# Patient Record
Sex: Female | Born: 1955 | Race: White | Hispanic: No | Marital: Married | Smoking: Former smoker
Health system: Southern US, Community
[De-identification: ages and names within clinical notes are randomized; demographics above are authoritative.]

## PROBLEM LIST (undated history)

## (undated) DIAGNOSIS — G8929 Other chronic pain: Secondary | ICD-10-CM

## (undated) DIAGNOSIS — M545 Low back pain, unspecified: Secondary | ICD-10-CM

## (undated) DIAGNOSIS — K760 Fatty (change of) liver, not elsewhere classified: Secondary | ICD-10-CM

## (undated) DIAGNOSIS — R112 Nausea with vomiting, unspecified: Secondary | ICD-10-CM

## (undated) DIAGNOSIS — R200 Anesthesia of skin: Secondary | ICD-10-CM

## (undated) DIAGNOSIS — F419 Anxiety disorder, unspecified: Secondary | ICD-10-CM

## (undated) DIAGNOSIS — I890 Lymphedema, not elsewhere classified: Secondary | ICD-10-CM

## (undated) DIAGNOSIS — I2699 Other pulmonary embolism without acute cor pulmonale: Secondary | ICD-10-CM

## (undated) DIAGNOSIS — S21009A Unspecified open wound of unspecified breast, initial encounter: Secondary | ICD-10-CM

## (undated) DIAGNOSIS — Z9889 Other specified postprocedural states: Secondary | ICD-10-CM

## (undated) DIAGNOSIS — IMO0002 Reserved for concepts with insufficient information to code with codable children: Secondary | ICD-10-CM

## (undated) DIAGNOSIS — Z9221 Personal history of antineoplastic chemotherapy: Secondary | ICD-10-CM

## (undated) DIAGNOSIS — Z923 Personal history of irradiation: Secondary | ICD-10-CM

## (undated) DIAGNOSIS — T7840XA Allergy, unspecified, initial encounter: Secondary | ICD-10-CM

## (undated) DIAGNOSIS — K219 Gastro-esophageal reflux disease without esophagitis: Secondary | ICD-10-CM

## (undated) DIAGNOSIS — J189 Pneumonia, unspecified organism: Secondary | ICD-10-CM

## (undated) DIAGNOSIS — G43909 Migraine, unspecified, not intractable, without status migrainosus: Secondary | ICD-10-CM

## (undated) DIAGNOSIS — G709 Myoneural disorder, unspecified: Secondary | ICD-10-CM

## (undated) DIAGNOSIS — R0602 Shortness of breath: Secondary | ICD-10-CM

## (undated) DIAGNOSIS — C50919 Malignant neoplasm of unspecified site of unspecified female breast: Secondary | ICD-10-CM

## (undated) DIAGNOSIS — L309 Dermatitis, unspecified: Secondary | ICD-10-CM

## (undated) HISTORY — DX: Fatty (change of) liver, not elsewhere classified: K76.0

## (undated) HISTORY — DX: Allergy, unspecified, initial encounter: T78.40XA

## (undated) HISTORY — PX: PORTACATH PLACEMENT: SHX2246

## (undated) HISTORY — DX: Reserved for concepts with insufficient information to code with codable children: IMO0002

## (undated) HISTORY — PX: BREAST SURGERY: SHX581

## (undated) HISTORY — DX: Lymphedema, not elsewhere classified: I89.0

## (undated) HISTORY — DX: Personal history of irradiation: Z92.3

## (undated) HISTORY — DX: Anesthesia of skin: R20.0

## (undated) HISTORY — DX: Gastro-esophageal reflux disease without esophagitis: K21.9

## (undated) HISTORY — DX: Other pulmonary embolism without acute cor pulmonale: I26.99

## (undated) HISTORY — DX: Unspecified open wound of unspecified breast, initial encounter: S21.009A

## (undated) HISTORY — PX: DIAGNOSTIC LAPAROSCOPY: SUR761

## (undated) HISTORY — DX: Dermatitis, unspecified: L30.9

## (undated) HISTORY — DX: Low back pain: M54.5

## (undated) HISTORY — DX: Low back pain, unspecified: M54.50

## (undated) HISTORY — PX: ABDOMINAL HYSTERECTOMY: SHX81

## (undated) HISTORY — DX: Other chronic pain: G89.29

## (undated) HISTORY — DX: Migraine, unspecified, not intractable, without status migrainosus: G43.909

## (undated) SURGERY — BRONCHOSCOPY, FLEXIBLE
Anesthesia: General | Laterality: Bilateral

---

## 1977-07-27 HISTORY — PX: TUBAL LIGATION: SHX77

## 1997-07-27 HISTORY — PX: CHOLECYSTECTOMY: SHX55

## 1997-12-07 ENCOUNTER — Ambulatory Visit (HOSPITAL_COMMUNITY): Admission: RE | Admit: 1997-12-07 | Discharge: 1997-12-07 | Payer: Self-pay | Admitting: General Surgery

## 1997-12-13 ENCOUNTER — Ambulatory Visit (HOSPITAL_COMMUNITY): Admission: RE | Admit: 1997-12-13 | Discharge: 1997-12-13 | Payer: Self-pay | Admitting: General Surgery

## 1998-06-14 ENCOUNTER — Ambulatory Visit (HOSPITAL_COMMUNITY): Admission: RE | Admit: 1998-06-14 | Discharge: 1998-06-14 | Payer: Self-pay | Admitting: General Surgery

## 1998-06-14 ENCOUNTER — Encounter: Payer: Self-pay | Admitting: General Surgery

## 1998-12-31 ENCOUNTER — Encounter: Payer: Self-pay | Admitting: General Surgery

## 1998-12-31 ENCOUNTER — Ambulatory Visit (HOSPITAL_COMMUNITY): Admission: RE | Admit: 1998-12-31 | Discharge: 1998-12-31 | Payer: Self-pay | Admitting: General Surgery

## 2000-01-02 ENCOUNTER — Encounter: Payer: Self-pay | Admitting: General Surgery

## 2000-01-02 ENCOUNTER — Encounter: Admission: RE | Admit: 2000-01-02 | Discharge: 2000-01-02 | Payer: Self-pay | Admitting: General Surgery

## 2001-08-27 ENCOUNTER — Emergency Department (HOSPITAL_COMMUNITY): Admission: EM | Admit: 2001-08-27 | Discharge: 2001-08-27 | Payer: Self-pay | Admitting: Emergency Medicine

## 2001-08-27 ENCOUNTER — Encounter: Payer: Self-pay | Admitting: Emergency Medicine

## 2002-06-07 ENCOUNTER — Other Ambulatory Visit: Admission: RE | Admit: 2002-06-07 | Discharge: 2002-06-07 | Payer: Self-pay | Admitting: Radiology

## 2003-04-21 ENCOUNTER — Observation Stay (HOSPITAL_COMMUNITY): Admission: EM | Admit: 2003-04-21 | Discharge: 2003-04-22 | Payer: Self-pay | Admitting: Emergency Medicine

## 2003-04-21 ENCOUNTER — Encounter: Payer: Self-pay | Admitting: Emergency Medicine

## 2003-06-14 ENCOUNTER — Encounter: Admission: RE | Admit: 2003-06-14 | Discharge: 2003-06-14 | Payer: Self-pay | Admitting: Internal Medicine

## 2003-06-25 ENCOUNTER — Encounter: Admission: RE | Admit: 2003-06-25 | Discharge: 2003-06-25 | Payer: Self-pay | Admitting: Internal Medicine

## 2010-09-22 ENCOUNTER — Other Ambulatory Visit: Payer: Self-pay | Admitting: Radiology

## 2010-09-23 ENCOUNTER — Other Ambulatory Visit: Payer: Self-pay | Admitting: Radiology

## 2010-09-23 DIAGNOSIS — C50912 Malignant neoplasm of unspecified site of left female breast: Secondary | ICD-10-CM

## 2010-09-28 ENCOUNTER — Ambulatory Visit
Admission: RE | Admit: 2010-09-28 | Discharge: 2010-09-28 | Disposition: A | Discharge status: Home / Self Care | Payer: BC Managed Care – PPO | Source: Ambulatory Visit | Attending: Radiology | Admitting: Radiology

## 2010-09-28 DIAGNOSIS — C50912 Malignant neoplasm of unspecified site of left female breast: Secondary | ICD-10-CM

## 2010-09-28 MED ORDER — GADOBENATE DIMEGLUMINE 529 MG/ML IV SOLN
15.0000 mL | Freq: Once | INTRAVENOUS | Status: AC | PRN
  Administered 2010-09-28: 15 mL via INTRAVENOUS

## 2010-10-01 ENCOUNTER — Other Ambulatory Visit: Payer: Self-pay | Admitting: Oncology

## 2010-10-01 ENCOUNTER — Encounter (HOSPITAL_BASED_OUTPATIENT_CLINIC_OR_DEPARTMENT_OTHER): Payer: BC Managed Care – PPO | Admitting: Oncology

## 2010-10-01 DIAGNOSIS — C50919 Malignant neoplasm of unspecified site of unspecified female breast: Secondary | ICD-10-CM

## 2010-10-01 DIAGNOSIS — K769 Liver disease, unspecified: Secondary | ICD-10-CM

## 2010-10-01 LAB — CBC WITH DIFFERENTIAL/PLATELET
BASO%: 0.3 % (ref 0.0–2.0)
EOS%: 1.3 % (ref 0.0–7.0)
LYMPH%: 27.5 % (ref 14.0–49.7)
MCHC: 34.1 g/dL (ref 31.5–36.0)
MCV: 86.8 fL (ref 79.5–101.0)
MONO%: 5.3 % (ref 0.0–14.0)
Platelets: 236 10*3/uL (ref 145–400)
RBC: 4.51 10*6/uL (ref 3.70–5.45)

## 2010-10-01 LAB — COMPREHENSIVE METABOLIC PANEL
ALT: 32 U/L (ref 0–35)
AST: 25 U/L (ref 0–37)
Alkaline Phosphatase: 65 U/L (ref 39–117)
Sodium: 141 mEq/L (ref 135–145)
Total Bilirubin: 0.6 mg/dL (ref 0.3–1.2)
Total Protein: 7.5 g/dL (ref 6.0–8.3)

## 2010-10-03 ENCOUNTER — Encounter: Payer: BC Managed Care – PPO | Admitting: Genetic Counselor

## 2010-10-06 ENCOUNTER — Ambulatory Visit
Admission: RE | Admit: 2010-10-06 | Discharge: 2010-10-06 | Disposition: A | Discharge status: Home / Self Care | Payer: BC Managed Care – PPO | Source: Ambulatory Visit | Attending: Oncology | Admitting: Oncology

## 2010-10-06 DIAGNOSIS — K769 Liver disease, unspecified: Secondary | ICD-10-CM

## 2010-10-06 MED ORDER — GADOBENATE DIMEGLUMINE 529 MG/ML IV SOLN
15.0000 mL | Freq: Once | INTRAVENOUS | Status: AC | PRN
  Administered 2010-10-06: 15 mL via INTRAVENOUS

## 2010-10-09 ENCOUNTER — Other Ambulatory Visit: Payer: Self-pay | Admitting: Oncology

## 2010-10-09 ENCOUNTER — Ambulatory Visit (HOSPITAL_COMMUNITY)
Admission: RE | Admit: 2010-10-09 | Discharge: 2010-10-09 | Disposition: A | Discharge status: Home / Self Care | Payer: BC Managed Care – PPO | Source: Ambulatory Visit | Attending: Oncology | Admitting: Oncology

## 2010-10-09 DIAGNOSIS — C119 Malignant neoplasm of nasopharynx, unspecified: Secondary | ICD-10-CM | POA: Insufficient documentation

## 2010-10-09 DIAGNOSIS — Z09 Encounter for follow-up examination after completed treatment for conditions other than malignant neoplasm: Secondary | ICD-10-CM | POA: Insufficient documentation

## 2010-10-10 ENCOUNTER — Ambulatory Visit (HOSPITAL_COMMUNITY)
Admission: RE | Admit: 2010-10-10 | Discharge: 2010-10-10 | Disposition: A | Discharge status: Home / Self Care | Payer: BC Managed Care – PPO | Source: Ambulatory Visit | Attending: Oncology | Admitting: Oncology

## 2010-10-10 ENCOUNTER — Encounter (HOSPITAL_COMMUNITY)
Admission: RE | Admit: 2010-10-10 | Discharge: 2010-10-10 | Disposition: A | Discharge status: Home / Self Care | Payer: BC Managed Care – PPO | Source: Ambulatory Visit | Attending: Oncology | Admitting: Oncology

## 2010-10-10 ENCOUNTER — Encounter (HOSPITAL_COMMUNITY): Payer: Self-pay

## 2010-10-10 DIAGNOSIS — R599 Enlarged lymph nodes, unspecified: Secondary | ICD-10-CM | POA: Insufficient documentation

## 2010-10-10 DIAGNOSIS — C50919 Malignant neoplasm of unspecified site of unspecified female breast: Secondary | ICD-10-CM | POA: Insufficient documentation

## 2010-10-10 DIAGNOSIS — C778 Secondary and unspecified malignant neoplasm of lymph nodes of multiple regions: Secondary | ICD-10-CM | POA: Insufficient documentation

## 2010-10-10 DIAGNOSIS — K7689 Other specified diseases of liver: Secondary | ICD-10-CM | POA: Insufficient documentation

## 2010-10-10 LAB — GLUCOSE, CAPILLARY: Glucose-Capillary: 101 mg/dL — ABNORMAL HIGH (ref 70–99)

## 2010-10-10 MED ORDER — FLUDEOXYGLUCOSE F - 18 (FDG) INJECTION
14.4000 | Freq: Once | INTRAVENOUS | Status: AC | PRN
  Administered 2010-10-10: 14.4 via INTRAVENOUS

## 2010-10-10 MED ORDER — IOHEXOL 300 MG/ML  SOLN
80.0000 mL | Freq: Once | INTRAMUSCULAR | Status: AC | PRN
  Administered 2010-10-10: 80 mL via INTRAVENOUS

## 2010-10-20 ENCOUNTER — Encounter (HOSPITAL_COMMUNITY): Payer: BC Managed Care – PPO

## 2010-10-20 ENCOUNTER — Other Ambulatory Visit: Payer: Self-pay | Admitting: Surgery

## 2010-10-20 LAB — CBC
HCT: 41.4 % (ref 36.0–46.0)
Hemoglobin: 13.3 g/dL (ref 12.0–15.0)
MCH: 28.7 pg (ref 26.0–34.0)
MCHC: 32.1 g/dL (ref 30.0–36.0)

## 2010-10-20 LAB — SURGICAL PCR SCREEN: MRSA, PCR: NEGATIVE

## 2010-10-23 ENCOUNTER — Observation Stay (HOSPITAL_COMMUNITY)
Admission: RE | Admit: 2010-10-23 | Discharge: 2010-10-24 | Disposition: A | Discharge status: Home / Self Care | Payer: BC Managed Care – PPO | Source: Ambulatory Visit | Attending: Surgery | Admitting: Surgery

## 2010-10-23 ENCOUNTER — Other Ambulatory Visit: Payer: Self-pay | Admitting: Surgery

## 2010-10-23 ENCOUNTER — Ambulatory Visit (HOSPITAL_COMMUNITY): Payer: BC Managed Care – PPO

## 2010-10-23 DIAGNOSIS — R110 Nausea: Secondary | ICD-10-CM | POA: Insufficient documentation

## 2010-10-23 DIAGNOSIS — C50919 Malignant neoplasm of unspecified site of unspecified female breast: Principal | ICD-10-CM | POA: Insufficient documentation

## 2010-10-23 DIAGNOSIS — Z01812 Encounter for preprocedural laboratory examination: Secondary | ICD-10-CM | POA: Insufficient documentation

## 2010-10-23 DIAGNOSIS — C773 Secondary and unspecified malignant neoplasm of axilla and upper limb lymph nodes: Secondary | ICD-10-CM | POA: Insufficient documentation

## 2010-10-23 DIAGNOSIS — Z01811 Encounter for preprocedural respiratory examination: Secondary | ICD-10-CM | POA: Insufficient documentation

## 2010-10-23 DIAGNOSIS — Z452 Encounter for adjustment and management of vascular access device: Secondary | ICD-10-CM

## 2010-10-23 HISTORY — DX: Malignant neoplasm of unspecified site of unspecified female breast: C50.919

## 2010-10-28 NOTE — Op Note (Signed)
Brandi Bates, Brandi Bates                  ACCOUNT NO.:  [id number]  MEDICAL RECORD NO.:  [id number]           PATIENT TYPE:  I  LOCATION:  1538                         FACILITY:  [facility]  PHYSICIAN:  Abigail Miyamoto, M.D. DATE OF BIRTH:  1955-08-12  DATE OF PROCEDURE:  10/23/2010 DATE OF DISCHARGE:                              OPERATIVE REPORT   PREOPERATIVE DIAGNOSIS:  Left breast cancer.  POSTOPERATIVE DIAGNOSIS:  Left breast cancer.  PROCEDURE: 1. Left modified radical mastectomy. 2. Right subclavian Port-A-Cath insertion (8-French PowerPort).  SURGEON:  Abigail Miyamoto, M.D.  ANESTHESIA:  General.  ESTIMATED BLOOD LOSS:  Minimal.  INDICATION:  Brandi Bates is a 55 year old female with a large mass in her left breast.  This was then proven by a biopsy to be consistent with invasive breast cancer.  She also has large hard lymph nodes in her axilla, which had been biopsied and proven to be consistent with metastatic lymph nodes as well.  After a long discussion with the patient, she wished to proceed with a mastectomy as well as a Port-A- Cath insertion.  She has already seen both Radiation and Medical Oncology, and will be starting chemotherapy.  PROCEDURE IN DETAIL:  The patient was brought to the operating room, identified as Brandi Bates.  She was placed supine on the operating table and general anesthesia was induced.  Her left chest and axilla were then prepped and draped in usual sterile fashion.  I created an elliptical incision on the left chest incorporating the areola and taken this up into the axilla moving medial to lateral.  I first created the superior skin flap going down the breast tissue and then dissecting the flap with the electrocautery up to the level of the clavicle and the chest wall musculature.  I then created the inferior flap going down past the inframammary ridge.  Thin flaps appeared to be created.  I took this all the way to the axilla with the  cautery.  I then excised the breast from the chest wall with the medial to lateral removing the breast from the pectoralis fascia.  The tumor itself did not appear to extend into the pectoralis muscle or fascia.  I then moved toward the axilla.  The patient had very large hard lymph nodes in the axilla.  I took the breast specimen off the pectoralis major muscle and then the minor muscle leaving toward the axilla.  Several bridging veins had to be clip with surgical clips.  The axillary vein was then identified.  Again, the patient had large firm adenopathy superficial to this, which I excised. The long thoracic and thoracodorsal nerves were identified.  The patient had a very large almost 4-5 cm lymph node that was stretching out the thoracodorsal nerve and artery complex.  I was able to separate this lymph node from the structures without injuring the structures.  The complete axillary package was then completely excised with the use of surgical clips and Metzenbaum scissors.  This was left intact with the breast specimen.  Once all the lymph nodes were completely excised, I examined the axilla and further  found once higher axillary lymph node, which I excised as well.  The specimen was labeled medially with a silk suture.  I then sent the entire contents to Pathology for evaluation.  I then irrigated the axilla and chest wall and flaps with saline. Hemostasis appeared to be achieved with cautery.  I then made 2 separate skin incisions and placed a 19-French Blake drain on the chest wall and another in the left axilla.  These were then sewn in place with nylon sutures.  I then closed the long incision with interrupted 2-0 Vicryl sutures and running 4-0 Monocryl.  Steri-Strips, gauze, and tape were then applied.  The drains were then shortened and placed to bulb suction.  The patient tolerated this portion of procedure well.  All counts were correct at the end of the procedure.  At this  point, the patient's right arm and left arm were tucked.  A whole new set was brought onto the field.  I then prepped her right chest and neck in the usual sterile fashion.  With the patient in Trendelenburg position, I used introducer needle to cannulate the right subclavian vein easily.  A guidewire was then passed through the needle into central venous system under direct fluoroscopy.  I made a separate skin incision at the level of the introducer site with a scalpel.  An 8-French PowerPort was brought to the field and attached and flushed.  I then created a pocket for the port.  Once this was performed, I then placed the introducer sheath and dilator up the wire and I was able to remove the wire and dilator from the subclavian vein leaving the introducer sheath in place. The port and catheter were then cut appropriately at length.  I then placed the port into the pocket and then fed the catheter down the introducer sheath.  The sheath was then peeled away leaving the catheter and central venous system.  Fluoroscopy confirmed placement in the superior vena cava.  I then again accessed the port and good flush and return were demonstrated.  The port was then sewn to the chest wall with 2 separate 2-0 Prolene sutures.  I then closed subcutaneous tissue with interrupted 3-0 sutures and closed the skin with running 4-0 Monocryl. Steri-Strips, gauze, and Tegaderm were then applied.  I did accessed the port through the skin and again good flush and flow were demonstrated and I instilled it with 5 cc of concentrated heparin solution.  The patient tolerated all the procedures well.  All sponge, needle, and instrument counts were correct at the end of the procedure.  The patient was then extubated in the operating room and taken in stable condition to recovery room.     Abigail Miyamoto, M.D.     DB/MEDQ  D:  10/23/2010  T:  10/24/2010  Job:  [id number]  Electronically Signed by Abigail Miyamoto M.D. on 10/28/2010 10:44:16 AM

## 2010-11-14 ENCOUNTER — Encounter (HOSPITAL_BASED_OUTPATIENT_CLINIC_OR_DEPARTMENT_OTHER): Payer: BC Managed Care – PPO | Admitting: Oncology

## 2010-11-14 DIAGNOSIS — C50919 Malignant neoplasm of unspecified site of unspecified female breast: Secondary | ICD-10-CM

## 2010-11-20 ENCOUNTER — Encounter (HOSPITAL_BASED_OUTPATIENT_CLINIC_OR_DEPARTMENT_OTHER): Payer: BC Managed Care – PPO | Admitting: Oncology

## 2010-11-20 DIAGNOSIS — Z5111 Encounter for antineoplastic chemotherapy: Secondary | ICD-10-CM

## 2010-11-20 DIAGNOSIS — Z452 Encounter for adjustment and management of vascular access device: Secondary | ICD-10-CM

## 2010-11-20 DIAGNOSIS — C50919 Malignant neoplasm of unspecified site of unspecified female breast: Secondary | ICD-10-CM

## 2010-11-21 ENCOUNTER — Encounter (HOSPITAL_BASED_OUTPATIENT_CLINIC_OR_DEPARTMENT_OTHER): Payer: BC Managed Care – PPO | Admitting: Oncology

## 2010-11-21 DIAGNOSIS — C50919 Malignant neoplasm of unspecified site of unspecified female breast: Secondary | ICD-10-CM

## 2010-11-21 DIAGNOSIS — Z5189 Encounter for other specified aftercare: Secondary | ICD-10-CM

## 2010-11-24 ENCOUNTER — Ambulatory Visit: Payer: BC Managed Care – PPO | Attending: Oncology | Admitting: Physical Therapy

## 2010-11-24 DIAGNOSIS — IMO0001 Reserved for inherently not codable concepts without codable children: Secondary | ICD-10-CM | POA: Insufficient documentation

## 2010-11-24 DIAGNOSIS — M24519 Contracture, unspecified shoulder: Secondary | ICD-10-CM | POA: Insufficient documentation

## 2010-11-24 DIAGNOSIS — Z853 Personal history of malignant neoplasm of breast: Secondary | ICD-10-CM | POA: Insufficient documentation

## 2010-11-24 DIAGNOSIS — I890 Lymphedema, not elsewhere classified: Secondary | ICD-10-CM | POA: Insufficient documentation

## 2010-11-26 ENCOUNTER — Other Ambulatory Visit: Payer: Self-pay | Admitting: Oncology

## 2010-11-26 ENCOUNTER — Ambulatory Visit: Payer: BC Managed Care – PPO | Attending: Oncology | Admitting: Physical Therapy

## 2010-11-26 ENCOUNTER — Encounter (HOSPITAL_BASED_OUTPATIENT_CLINIC_OR_DEPARTMENT_OTHER): Payer: BC Managed Care – PPO | Admitting: Oncology

## 2010-11-26 DIAGNOSIS — I890 Lymphedema, not elsewhere classified: Secondary | ICD-10-CM | POA: Insufficient documentation

## 2010-11-26 DIAGNOSIS — IMO0001 Reserved for inherently not codable concepts without codable children: Secondary | ICD-10-CM | POA: Insufficient documentation

## 2010-11-26 DIAGNOSIS — Z853 Personal history of malignant neoplasm of breast: Secondary | ICD-10-CM | POA: Insufficient documentation

## 2010-11-26 DIAGNOSIS — M24519 Contracture, unspecified shoulder: Secondary | ICD-10-CM | POA: Insufficient documentation

## 2010-11-26 DIAGNOSIS — C50919 Malignant neoplasm of unspecified site of unspecified female breast: Secondary | ICD-10-CM

## 2010-11-26 LAB — CBC WITH DIFFERENTIAL/PLATELET
Basophils Absolute: 0 10*3/uL (ref 0.0–0.1)
EOS%: 7.6 % — ABNORMAL HIGH (ref 0.0–7.0)
HCT: 37 % (ref 34.8–46.6)
HGB: 12.4 g/dL (ref 11.6–15.9)
MCH: 28.6 pg (ref 25.1–34.0)
MONO#: 0 10*3/uL — ABNORMAL LOW (ref 0.1–0.9)
NEUT%: 37.1 % — ABNORMAL LOW (ref 38.4–76.8)
lymph#: 0.5 10*3/uL — ABNORMAL LOW (ref 0.9–3.3)

## 2010-12-01 ENCOUNTER — Ambulatory Visit: Payer: BC Managed Care – PPO | Admitting: Physical Therapy

## 2010-12-03 ENCOUNTER — Ambulatory Visit: Payer: BC Managed Care – PPO | Admitting: Physical Therapy

## 2010-12-03 ENCOUNTER — Other Ambulatory Visit: Payer: Self-pay | Admitting: Oncology

## 2010-12-03 ENCOUNTER — Encounter (HOSPITAL_BASED_OUTPATIENT_CLINIC_OR_DEPARTMENT_OTHER): Payer: BC Managed Care – PPO | Admitting: Oncology

## 2010-12-03 DIAGNOSIS — C50919 Malignant neoplasm of unspecified site of unspecified female breast: Secondary | ICD-10-CM

## 2010-12-03 LAB — CBC WITH DIFFERENTIAL/PLATELET
Basophils Absolute: 0 10*3/uL (ref 0.0–0.1)
EOS%: 0.1 % (ref 0.0–7.0)
HCT: 37.3 % (ref 34.8–46.6)
HGB: 12.5 g/dL (ref 11.6–15.9)
LYMPH%: 15.4 % (ref 14.0–49.7)
MCH: 28.4 pg (ref 25.1–34.0)
MCV: 84.8 fL (ref 79.5–101.0)
MONO%: 4.2 % (ref 0.0–14.0)
NEUT%: 80 % — ABNORMAL HIGH (ref 38.4–76.8)
Platelets: 157 10*3/uL (ref 145–400)

## 2010-12-04 ENCOUNTER — Encounter (HOSPITAL_BASED_OUTPATIENT_CLINIC_OR_DEPARTMENT_OTHER): Payer: BC Managed Care – PPO | Admitting: Oncology

## 2010-12-04 DIAGNOSIS — Z5111 Encounter for antineoplastic chemotherapy: Secondary | ICD-10-CM

## 2010-12-04 DIAGNOSIS — C50919 Malignant neoplasm of unspecified site of unspecified female breast: Secondary | ICD-10-CM

## 2010-12-05 ENCOUNTER — Encounter (HOSPITAL_BASED_OUTPATIENT_CLINIC_OR_DEPARTMENT_OTHER): Payer: BC Managed Care – PPO | Admitting: Oncology

## 2010-12-05 DIAGNOSIS — Z5189 Encounter for other specified aftercare: Secondary | ICD-10-CM

## 2010-12-05 DIAGNOSIS — C50919 Malignant neoplasm of unspecified site of unspecified female breast: Secondary | ICD-10-CM

## 2010-12-08 ENCOUNTER — Ambulatory Visit: Payer: BC Managed Care – PPO | Admitting: Physical Therapy

## 2010-12-10 ENCOUNTER — Ambulatory Visit: Payer: BC Managed Care – PPO | Admitting: Physical Therapy

## 2010-12-11 ENCOUNTER — Encounter (HOSPITAL_BASED_OUTPATIENT_CLINIC_OR_DEPARTMENT_OTHER): Payer: BC Managed Care – PPO | Admitting: Oncology

## 2010-12-11 ENCOUNTER — Other Ambulatory Visit: Payer: Self-pay | Admitting: Oncology

## 2010-12-11 DIAGNOSIS — D702 Other drug-induced agranulocytosis: Secondary | ICD-10-CM

## 2010-12-11 DIAGNOSIS — Z170 Estrogen receptor positive status [ER+]: Secondary | ICD-10-CM

## 2010-12-11 DIAGNOSIS — B370 Candidal stomatitis: Secondary | ICD-10-CM

## 2010-12-11 DIAGNOSIS — C50919 Malignant neoplasm of unspecified site of unspecified female breast: Secondary | ICD-10-CM

## 2010-12-11 LAB — CBC WITH DIFFERENTIAL/PLATELET
BASO%: 0 % (ref 0.0–2.0)
LYMPH%: 70.2 % — ABNORMAL HIGH (ref 14.0–49.7)
MCHC: 33.4 g/dL (ref 31.5–36.0)
MCV: 84.7 fL (ref 79.5–101.0)
MONO%: 12.8 % (ref 0.0–14.0)
NEUT%: 14.9 % — ABNORMAL LOW (ref 38.4–76.8)
Platelets: 112 10*3/uL — ABNORMAL LOW (ref 145–400)
RBC: 4.13 10*6/uL (ref 3.70–5.45)
nRBC: 0 % (ref 0–0)

## 2010-12-15 ENCOUNTER — Ambulatory Visit: Payer: BC Managed Care – PPO | Admitting: Physical Therapy

## 2010-12-17 ENCOUNTER — Other Ambulatory Visit: Payer: Self-pay | Admitting: Oncology

## 2010-12-17 ENCOUNTER — Encounter (HOSPITAL_BASED_OUTPATIENT_CLINIC_OR_DEPARTMENT_OTHER): Payer: BC Managed Care – PPO | Admitting: Oncology

## 2010-12-17 ENCOUNTER — Encounter: Payer: BC Managed Care – PPO | Admitting: Physical Therapy

## 2010-12-17 DIAGNOSIS — B370 Candidal stomatitis: Secondary | ICD-10-CM

## 2010-12-17 DIAGNOSIS — Z170 Estrogen receptor positive status [ER+]: Secondary | ICD-10-CM

## 2010-12-17 DIAGNOSIS — C50919 Malignant neoplasm of unspecified site of unspecified female breast: Secondary | ICD-10-CM

## 2010-12-17 LAB — CBC WITH DIFFERENTIAL/PLATELET
BASO%: 0 % (ref 0.0–2.0)
EOS%: 0 % (ref 0.0–7.0)
HCT: 34.7 % — ABNORMAL LOW (ref 34.8–46.6)
MCH: 29.5 pg (ref 25.1–34.0)
MCHC: 34.4 g/dL (ref 31.5–36.0)
MCV: 86 fL (ref 79.5–101.0)
MONO%: 3.6 % (ref 0.0–14.0)
NEUT%: 86.9 % — ABNORMAL HIGH (ref 38.4–76.8)
RDW: 13.9 % (ref 11.2–14.5)
lymph#: 0.8 10*3/uL — ABNORMAL LOW (ref 0.9–3.3)

## 2010-12-18 ENCOUNTER — Encounter (HOSPITAL_BASED_OUTPATIENT_CLINIC_OR_DEPARTMENT_OTHER): Payer: BC Managed Care – PPO | Admitting: Oncology

## 2010-12-18 ENCOUNTER — Other Ambulatory Visit: Payer: Self-pay | Admitting: Oncology

## 2010-12-18 DIAGNOSIS — Z5111 Encounter for antineoplastic chemotherapy: Secondary | ICD-10-CM

## 2010-12-18 DIAGNOSIS — C50919 Malignant neoplasm of unspecified site of unspecified female breast: Secondary | ICD-10-CM

## 2010-12-18 LAB — COMPREHENSIVE METABOLIC PANEL
ALT: 33 U/L (ref 0–35)
AST: 16 U/L (ref 0–37)
Alkaline Phosphatase: 82 U/L (ref 39–117)
Calcium: 8.9 mg/dL (ref 8.4–10.5)
Chloride: 106 mEq/L (ref 96–112)
Creatinine, Ser: 0.57 mg/dL (ref 0.40–1.20)
Total Bilirubin: 0.2 mg/dL — ABNORMAL LOW (ref 0.3–1.2)

## 2010-12-19 ENCOUNTER — Encounter (HOSPITAL_BASED_OUTPATIENT_CLINIC_OR_DEPARTMENT_OTHER): Payer: BC Managed Care – PPO | Admitting: Oncology

## 2010-12-19 ENCOUNTER — Encounter (INDEPENDENT_AMBULATORY_CARE_PROVIDER_SITE_OTHER): Payer: Self-pay | Admitting: Surgery

## 2010-12-19 DIAGNOSIS — Z5189 Encounter for other specified aftercare: Secondary | ICD-10-CM

## 2010-12-19 DIAGNOSIS — C50919 Malignant neoplasm of unspecified site of unspecified female breast: Secondary | ICD-10-CM

## 2010-12-24 ENCOUNTER — Encounter: Payer: BC Managed Care – PPO | Admitting: Physical Therapy

## 2010-12-25 ENCOUNTER — Other Ambulatory Visit: Payer: Self-pay | Admitting: Oncology

## 2010-12-25 ENCOUNTER — Encounter (HOSPITAL_BASED_OUTPATIENT_CLINIC_OR_DEPARTMENT_OTHER): Payer: BC Managed Care – PPO | Admitting: Oncology

## 2010-12-25 DIAGNOSIS — C50919 Malignant neoplasm of unspecified site of unspecified female breast: Secondary | ICD-10-CM

## 2010-12-25 LAB — CBC WITH DIFFERENTIAL/PLATELET
BASO%: 0.8 % (ref 0.0–2.0)
Basophils Absolute: 0 10*3/uL (ref 0.0–0.1)
EOS%: 0.9 % (ref 0.0–7.0)
HCT: 31.9 % — ABNORMAL LOW (ref 34.8–46.6)
HGB: 11 g/dL — ABNORMAL LOW (ref 11.6–15.9)
LYMPH%: 37 % (ref 14.0–49.7)
MCH: 29.6 pg (ref 25.1–34.0)
MCHC: 34.6 g/dL (ref 31.5–36.0)
MCV: 85.6 fL (ref 79.5–101.0)
MONO%: 10.2 % (ref 0.0–14.0)
NEUT%: 51.1 % (ref 38.4–76.8)
Platelets: 81 10*3/uL — ABNORMAL LOW (ref 145–400)

## 2010-12-29 ENCOUNTER — Encounter: Payer: BC Managed Care – PPO | Admitting: Physical Therapy

## 2010-12-31 ENCOUNTER — Encounter: Payer: BC Managed Care – PPO | Admitting: Physical Therapy

## 2011-01-01 ENCOUNTER — Other Ambulatory Visit: Payer: Self-pay | Admitting: Oncology

## 2011-01-01 ENCOUNTER — Encounter (HOSPITAL_BASED_OUTPATIENT_CLINIC_OR_DEPARTMENT_OTHER): Payer: BC Managed Care – PPO | Admitting: Oncology

## 2011-01-01 DIAGNOSIS — C50919 Malignant neoplasm of unspecified site of unspecified female breast: Secondary | ICD-10-CM

## 2011-01-01 DIAGNOSIS — Z5111 Encounter for antineoplastic chemotherapy: Secondary | ICD-10-CM

## 2011-01-01 LAB — COMPREHENSIVE METABOLIC PANEL WITH GFR
ALT: 35 U/L (ref 0–35)
AST: 16 U/L (ref 0–37)
Albumin: 4.3 g/dL (ref 3.5–5.2)
Alkaline Phosphatase: 86 U/L (ref 39–117)
BUN: 15 mg/dL (ref 6–23)
CO2: 21 meq/L (ref 19–32)
Calcium: 9.3 mg/dL (ref 8.4–10.5)
Chloride: 104 meq/L (ref 96–112)
Creatinine, Ser: 0.7 mg/dL (ref 0.50–1.10)
Glucose, Bld: 128 mg/dL — ABNORMAL HIGH (ref 70–99)
Potassium: 3.7 meq/L (ref 3.5–5.3)
Sodium: 139 meq/L (ref 135–145)
Total Bilirubin: 0.3 mg/dL (ref 0.3–1.2)
Total Protein: 6.5 g/dL (ref 6.0–8.3)

## 2011-01-01 LAB — CBC WITH DIFFERENTIAL/PLATELET
BASO%: 0.3 % (ref 0.0–2.0)
Basophils Absolute: 0 10*3/uL (ref 0.0–0.1)
EOS%: 0 % (ref 0.0–7.0)
MCH: 27.8 pg (ref 25.1–34.0)
MCHC: 32.8 g/dL (ref 31.5–36.0)
MCV: 85 fL (ref 79.5–101.0)
MONO%: 5 % (ref 0.0–14.0)
RDW: 15.3 % — ABNORMAL HIGH (ref 11.2–14.5)
lymph#: 0.7 10*3/uL — ABNORMAL LOW (ref 0.9–3.3)

## 2011-01-01 LAB — CANCER ANTIGEN 27.29: CA 27.29: 45 U/mL — ABNORMAL HIGH (ref 0–39)

## 2011-01-02 ENCOUNTER — Encounter (HOSPITAL_BASED_OUTPATIENT_CLINIC_OR_DEPARTMENT_OTHER): Payer: BC Managed Care – PPO | Admitting: Oncology

## 2011-01-02 DIAGNOSIS — C50919 Malignant neoplasm of unspecified site of unspecified female breast: Secondary | ICD-10-CM

## 2011-01-02 DIAGNOSIS — Z5189 Encounter for other specified aftercare: Secondary | ICD-10-CM

## 2011-01-05 ENCOUNTER — Other Ambulatory Visit: Payer: Self-pay | Admitting: Oncology

## 2011-01-05 ENCOUNTER — Encounter (HOSPITAL_BASED_OUTPATIENT_CLINIC_OR_DEPARTMENT_OTHER): Payer: BC Managed Care – PPO | Admitting: Oncology

## 2011-01-05 DIAGNOSIS — Z5111 Encounter for antineoplastic chemotherapy: Secondary | ICD-10-CM

## 2011-01-05 DIAGNOSIS — C50919 Malignant neoplasm of unspecified site of unspecified female breast: Secondary | ICD-10-CM

## 2011-01-05 DIAGNOSIS — B370 Candidal stomatitis: Secondary | ICD-10-CM

## 2011-01-05 DIAGNOSIS — Z170 Estrogen receptor positive status [ER+]: Secondary | ICD-10-CM

## 2011-01-05 LAB — COMPREHENSIVE METABOLIC PANEL
ALT: 25 U/L (ref 0–35)
AST: 13 U/L (ref 0–37)
Albumin: 3.7 g/dL (ref 3.5–5.2)
CO2: 28 mEq/L (ref 19–32)
Calcium: 9.3 mg/dL (ref 8.4–10.5)
Chloride: 99 mEq/L (ref 96–112)
Creatinine, Ser: 0.49 mg/dL — ABNORMAL LOW (ref 0.50–1.10)
Potassium: 4 mEq/L (ref 3.5–5.3)

## 2011-01-05 LAB — CBC WITH DIFFERENTIAL/PLATELET
BASO%: 0 % (ref 0.0–2.0)
Eosinophils Absolute: 0 10*3/uL (ref 0.0–0.5)
HCT: 32.4 % — ABNORMAL LOW (ref 34.8–46.6)
LYMPH%: 0.9 % — ABNORMAL LOW (ref 14.0–49.7)
MCHC: 34.1 g/dL (ref 31.5–36.0)
MONO#: 0.1 10*3/uL (ref 0.1–0.9)
NEUT#: 34.7 10*3/uL — ABNORMAL HIGH (ref 1.5–6.5)
NEUT%: 98.9 % — ABNORMAL HIGH (ref 38.4–76.8)
Platelets: 140 10*3/uL — ABNORMAL LOW (ref 145–400)
RBC: 3.73 10*6/uL (ref 3.70–5.45)
WBC: 35.1 10*3/uL — ABNORMAL HIGH (ref 3.9–10.3)
lymph#: 0.3 10*3/uL — ABNORMAL LOW (ref 0.9–3.3)
nRBC: 0 % (ref 0–0)

## 2011-01-07 ENCOUNTER — Other Ambulatory Visit: Payer: Self-pay | Admitting: Oncology

## 2011-01-07 ENCOUNTER — Encounter (HOSPITAL_BASED_OUTPATIENT_CLINIC_OR_DEPARTMENT_OTHER): Payer: BC Managed Care – PPO | Admitting: Oncology

## 2011-01-07 DIAGNOSIS — Z170 Estrogen receptor positive status [ER+]: Secondary | ICD-10-CM

## 2011-01-07 DIAGNOSIS — R5383 Other fatigue: Secondary | ICD-10-CM

## 2011-01-07 DIAGNOSIS — C50919 Malignant neoplasm of unspecified site of unspecified female breast: Secondary | ICD-10-CM

## 2011-01-07 LAB — CBC WITH DIFFERENTIAL/PLATELET
BASO%: 0 % (ref 0.0–2.0)
EOS%: 0.3 % (ref 0.0–7.0)
MCH: 29.4 pg (ref 25.1–34.0)
MCHC: 34.2 g/dL (ref 31.5–36.0)
MONO#: 0 10*3/uL — ABNORMAL LOW (ref 0.1–0.9)
RBC: 3.39 10*6/uL — ABNORMAL LOW (ref 3.70–5.45)
RDW: 15.9 % — ABNORMAL HIGH (ref 11.2–14.5)
WBC: 1.2 10*3/uL — ABNORMAL LOW (ref 3.9–10.3)
lymph#: 0.1 10*3/uL — ABNORMAL LOW (ref 0.9–3.3)

## 2011-01-15 ENCOUNTER — Encounter (HOSPITAL_BASED_OUTPATIENT_CLINIC_OR_DEPARTMENT_OTHER): Payer: BC Managed Care – PPO | Admitting: Oncology

## 2011-01-15 ENCOUNTER — Other Ambulatory Visit: Payer: Self-pay | Admitting: Oncology

## 2011-01-15 DIAGNOSIS — C50919 Malignant neoplasm of unspecified site of unspecified female breast: Secondary | ICD-10-CM

## 2011-01-15 DIAGNOSIS — Z5111 Encounter for antineoplastic chemotherapy: Secondary | ICD-10-CM

## 2011-01-15 LAB — CBC WITH DIFFERENTIAL/PLATELET
BASO%: 0.1 % (ref 0.0–2.0)
EOS%: 0 % (ref 0.0–7.0)
Eosinophils Absolute: 0 10*3/uL (ref 0.0–0.5)
LYMPH%: 6.4 % — ABNORMAL LOW (ref 14.0–49.7)
MCH: 28 pg (ref 25.1–34.0)
MCHC: 32.6 g/dL (ref 31.5–36.0)
MCV: 85.9 fL (ref 79.5–101.0)
MONO%: 4.6 % (ref 0.0–14.0)
NEUT#: 6.9 10*3/uL — ABNORMAL HIGH (ref 1.5–6.5)
Platelets: 177 10*3/uL (ref 145–400)
RBC: 3.54 10*6/uL — ABNORMAL LOW (ref 3.70–5.45)
RDW: 16.3 % — ABNORMAL HIGH (ref 11.2–14.5)
nRBC: 1 % — ABNORMAL HIGH (ref 0–0)

## 2011-01-22 ENCOUNTER — Encounter (HOSPITAL_BASED_OUTPATIENT_CLINIC_OR_DEPARTMENT_OTHER): Payer: BC Managed Care – PPO | Admitting: Oncology

## 2011-01-22 ENCOUNTER — Other Ambulatory Visit: Payer: Self-pay | Admitting: Oncology

## 2011-01-22 DIAGNOSIS — Z5111 Encounter for antineoplastic chemotherapy: Secondary | ICD-10-CM

## 2011-01-22 DIAGNOSIS — C50919 Malignant neoplasm of unspecified site of unspecified female breast: Secondary | ICD-10-CM

## 2011-01-22 LAB — CBC WITH DIFFERENTIAL/PLATELET
BASO%: 0.1 % (ref 0.0–2.0)
EOS%: 0.1 % (ref 0.0–7.0)
MCH: 29.6 pg (ref 25.1–34.0)
MCHC: 34.5 g/dL (ref 31.5–36.0)
MONO#: 0.3 10*3/uL (ref 0.1–0.9)
NEUT%: 84 % — ABNORMAL HIGH (ref 38.4–76.8)
RBC: 3.51 10*6/uL — ABNORMAL LOW (ref 3.70–5.45)
RDW: 17.5 % — ABNORMAL HIGH (ref 11.2–14.5)
WBC: 5.9 10*3/uL (ref 3.9–10.3)
lymph#: 0.6 10*3/uL — ABNORMAL LOW (ref 0.9–3.3)

## 2011-01-29 ENCOUNTER — Encounter (HOSPITAL_BASED_OUTPATIENT_CLINIC_OR_DEPARTMENT_OTHER): Payer: BC Managed Care – PPO | Admitting: Oncology

## 2011-01-29 ENCOUNTER — Other Ambulatory Visit: Payer: Self-pay | Admitting: Oncology

## 2011-01-29 DIAGNOSIS — Z170 Estrogen receptor positive status [ER+]: Secondary | ICD-10-CM

## 2011-01-29 DIAGNOSIS — R5383 Other fatigue: Secondary | ICD-10-CM

## 2011-01-29 DIAGNOSIS — C50919 Malignant neoplasm of unspecified site of unspecified female breast: Secondary | ICD-10-CM

## 2011-01-29 DIAGNOSIS — Z5111 Encounter for antineoplastic chemotherapy: Secondary | ICD-10-CM

## 2011-01-29 LAB — COMPREHENSIVE METABOLIC PANEL
AST: 19 U/L (ref 0–37)
Albumin: 4.3 g/dL (ref 3.5–5.2)
Alkaline Phosphatase: 66 U/L (ref 39–117)
Calcium: 9.3 mg/dL (ref 8.4–10.5)
Chloride: 106 mEq/L (ref 96–112)
Potassium: 3.8 mEq/L (ref 3.5–5.3)
Sodium: 142 mEq/L (ref 135–145)
Total Protein: 6.1 g/dL (ref 6.0–8.3)

## 2011-01-29 LAB — CBC WITH DIFFERENTIAL/PLATELET
BASO%: 0.2 % (ref 0.0–2.0)
Basophils Absolute: 0 10*3/uL (ref 0.0–0.1)
EOS%: 0.7 % (ref 0.0–7.0)
HCT: 30.2 % — ABNORMAL LOW (ref 34.8–46.6)
HGB: 9.9 g/dL — ABNORMAL LOW (ref 11.6–15.9)
MCH: 28.7 pg (ref 25.1–34.0)
MCHC: 32.8 g/dL (ref 31.5–36.0)
MONO#: 0.3 10*3/uL (ref 0.1–0.9)
NEUT%: 78 % — ABNORMAL HIGH (ref 38.4–76.8)
RDW: 19.1 % — ABNORMAL HIGH (ref 11.2–14.5)
WBC: 4.4 10*3/uL (ref 3.9–10.3)
lymph#: 0.7 10*3/uL — ABNORMAL LOW (ref 0.9–3.3)

## 2011-02-05 ENCOUNTER — Other Ambulatory Visit: Payer: Self-pay | Admitting: Oncology

## 2011-02-05 ENCOUNTER — Encounter (HOSPITAL_BASED_OUTPATIENT_CLINIC_OR_DEPARTMENT_OTHER): Payer: BC Managed Care – PPO | Admitting: Oncology

## 2011-02-05 DIAGNOSIS — C50919 Malignant neoplasm of unspecified site of unspecified female breast: Secondary | ICD-10-CM

## 2011-02-05 LAB — CBC WITH DIFFERENTIAL/PLATELET
BASO%: 0.5 % (ref 0.0–2.0)
EOS%: 1.2 % (ref 0.0–7.0)
MCH: 28.8 pg (ref 25.1–34.0)
MCHC: 32.4 g/dL (ref 31.5–36.0)
MCV: 88.9 fL (ref 79.5–101.0)
MONO%: 5.3 % (ref 0.0–14.0)
RBC: 3.33 10*6/uL — ABNORMAL LOW (ref 3.70–5.45)
RDW: 19.7 % — ABNORMAL HIGH (ref 11.2–14.5)
lymph#: 0.6 10*3/uL — ABNORMAL LOW (ref 0.9–3.3)
nRBC: 1 % — ABNORMAL HIGH (ref 0–0)

## 2011-02-12 ENCOUNTER — Ambulatory Visit (HOSPITAL_COMMUNITY)
Admission: RE | Admit: 2011-02-12 | Discharge: 2011-02-12 | Disposition: A | Discharge status: Home / Self Care | Payer: BC Managed Care – PPO | Source: Ambulatory Visit | Attending: Oncology | Admitting: Oncology

## 2011-02-12 ENCOUNTER — Other Ambulatory Visit: Payer: Self-pay | Admitting: Oncology

## 2011-02-12 ENCOUNTER — Encounter (HOSPITAL_BASED_OUTPATIENT_CLINIC_OR_DEPARTMENT_OTHER): Payer: BC Managed Care – PPO | Admitting: Oncology

## 2011-02-12 DIAGNOSIS — M546 Pain in thoracic spine: Secondary | ICD-10-CM | POA: Insufficient documentation

## 2011-02-12 DIAGNOSIS — Z9089 Acquired absence of other organs: Secondary | ICD-10-CM | POA: Insufficient documentation

## 2011-02-12 DIAGNOSIS — Z5111 Encounter for antineoplastic chemotherapy: Secondary | ICD-10-CM

## 2011-02-12 DIAGNOSIS — R52 Pain, unspecified: Secondary | ICD-10-CM

## 2011-02-12 DIAGNOSIS — C50919 Malignant neoplasm of unspecified site of unspecified female breast: Secondary | ICD-10-CM

## 2011-02-12 DIAGNOSIS — M545 Low back pain, unspecified: Secondary | ICD-10-CM | POA: Insufficient documentation

## 2011-02-12 DIAGNOSIS — Z853 Personal history of malignant neoplasm of breast: Secondary | ICD-10-CM | POA: Insufficient documentation

## 2011-02-12 DIAGNOSIS — Z452 Encounter for adjustment and management of vascular access device: Secondary | ICD-10-CM

## 2011-02-12 LAB — CBC WITH DIFFERENTIAL/PLATELET
BASO%: 0.5 % (ref 0.0–2.0)
LYMPH%: 18.8 % (ref 14.0–49.7)
MCHC: 33.2 g/dL (ref 31.5–36.0)
MONO#: 0.2 10*3/uL (ref 0.1–0.9)
Platelets: 214 10*3/uL (ref 145–400)
RBC: 3.55 10*6/uL — ABNORMAL LOW (ref 3.70–5.45)
RDW: 19.5 % — ABNORMAL HIGH (ref 11.2–14.5)
WBC: 4 10*3/uL (ref 3.9–10.3)
lymph#: 0.8 10*3/uL — ABNORMAL LOW (ref 0.9–3.3)
nRBC: 1 % — ABNORMAL HIGH (ref 0–0)

## 2011-02-19 ENCOUNTER — Encounter (HOSPITAL_BASED_OUTPATIENT_CLINIC_OR_DEPARTMENT_OTHER): Payer: BC Managed Care – PPO | Admitting: Oncology

## 2011-02-19 ENCOUNTER — Other Ambulatory Visit: Payer: Self-pay | Admitting: Oncology

## 2011-02-19 DIAGNOSIS — Z5111 Encounter for antineoplastic chemotherapy: Secondary | ICD-10-CM

## 2011-02-19 DIAGNOSIS — C50919 Malignant neoplasm of unspecified site of unspecified female breast: Secondary | ICD-10-CM

## 2011-02-19 DIAGNOSIS — Z170 Estrogen receptor positive status [ER+]: Secondary | ICD-10-CM

## 2011-02-19 LAB — COMPREHENSIVE METABOLIC PANEL
ALT: 39 U/L — ABNORMAL HIGH (ref 0–35)
BUN: 14 mg/dL (ref 6–23)
CO2: 26 mEq/L (ref 19–32)
Calcium: 9.9 mg/dL (ref 8.4–10.5)
Chloride: 104 mEq/L (ref 96–112)
Creatinine, Ser: 0.6 mg/dL (ref 0.50–1.10)
Glucose, Bld: 95 mg/dL (ref 70–99)

## 2011-02-19 LAB — CBC WITH DIFFERENTIAL/PLATELET
Basophils Absolute: 0 10*3/uL (ref 0.0–0.1)
Eosinophils Absolute: 0.1 10*3/uL (ref 0.0–0.5)
HCT: 33.1 % — ABNORMAL LOW (ref 34.8–46.6)
LYMPH%: 20.5 % (ref 14.0–49.7)
MONO#: 0.2 10*3/uL (ref 0.1–0.9)
NEUT#: 3 10*3/uL (ref 1.5–6.5)
NEUT%: 72.4 % (ref 38.4–76.8)
Platelets: 214 10*3/uL (ref 145–400)
WBC: 4.2 10*3/uL (ref 3.9–10.3)

## 2011-02-26 ENCOUNTER — Other Ambulatory Visit: Payer: Self-pay | Admitting: Oncology

## 2011-02-26 ENCOUNTER — Encounter (HOSPITAL_BASED_OUTPATIENT_CLINIC_OR_DEPARTMENT_OTHER): Payer: BC Managed Care – PPO | Admitting: Oncology

## 2011-02-26 DIAGNOSIS — M545 Low back pain: Secondary | ICD-10-CM

## 2011-02-26 DIAGNOSIS — R21 Rash and other nonspecific skin eruption: Secondary | ICD-10-CM

## 2011-02-26 DIAGNOSIS — Z5111 Encounter for antineoplastic chemotherapy: Secondary | ICD-10-CM

## 2011-02-26 DIAGNOSIS — C50919 Malignant neoplasm of unspecified site of unspecified female breast: Secondary | ICD-10-CM

## 2011-02-26 LAB — COMPREHENSIVE METABOLIC PANEL
Alkaline Phosphatase: 85 U/L (ref 39–117)
BUN: 13 mg/dL (ref 6–23)
Glucose, Bld: 93 mg/dL (ref 70–99)
Total Bilirubin: 0.4 mg/dL (ref 0.3–1.2)

## 2011-02-26 LAB — CBC WITH DIFFERENTIAL/PLATELET
Basophils Absolute: 0 10*3/uL (ref 0.0–0.1)
EOS%: 1.2 % (ref 0.0–7.0)
HCT: 33.8 % — ABNORMAL LOW (ref 34.8–46.6)
HGB: 11.2 g/dL — ABNORMAL LOW (ref 11.6–15.9)
MCH: 30.4 pg (ref 25.1–34.0)
MCV: 91.8 fL (ref 79.5–101.0)
MONO%: 4 % (ref 0.0–14.0)
NEUT%: 76.4 % (ref 38.4–76.8)
RDW: 18.3 % — ABNORMAL HIGH (ref 11.2–14.5)

## 2011-03-05 ENCOUNTER — Encounter (HOSPITAL_BASED_OUTPATIENT_CLINIC_OR_DEPARTMENT_OTHER): Payer: BC Managed Care – PPO | Admitting: Oncology

## 2011-03-05 ENCOUNTER — Other Ambulatory Visit: Payer: Self-pay | Admitting: Oncology

## 2011-03-05 DIAGNOSIS — Z452 Encounter for adjustment and management of vascular access device: Secondary | ICD-10-CM

## 2011-03-05 DIAGNOSIS — C50919 Malignant neoplasm of unspecified site of unspecified female breast: Secondary | ICD-10-CM

## 2011-03-05 DIAGNOSIS — Z5111 Encounter for antineoplastic chemotherapy: Secondary | ICD-10-CM

## 2011-03-05 LAB — CBC WITH DIFFERENTIAL/PLATELET
BASO%: 0.3 % (ref 0.0–2.0)
Basophils Absolute: 0 10*3/uL (ref 0.0–0.1)
EOS%: 1 % (ref 0.0–7.0)
Eosinophils Absolute: 0 10*3/uL (ref 0.0–0.5)
HGB: 11.3 g/dL — ABNORMAL LOW (ref 11.6–15.9)
LYMPH%: 16.6 % (ref 14.0–49.7)
MCV: 93.7 fL (ref 79.5–101.0)
NEUT#: 3 10*3/uL (ref 1.5–6.5)
Platelets: 216 10*3/uL (ref 145–400)
nRBC: 0 % (ref 0–0)

## 2011-03-11 ENCOUNTER — Ambulatory Visit: Payer: BC Managed Care – PPO | Attending: Oncology | Admitting: Physical Therapy

## 2011-03-11 DIAGNOSIS — M24519 Contracture, unspecified shoulder: Secondary | ICD-10-CM | POA: Insufficient documentation

## 2011-03-11 DIAGNOSIS — IMO0001 Reserved for inherently not codable concepts without codable children: Secondary | ICD-10-CM | POA: Insufficient documentation

## 2011-03-11 DIAGNOSIS — I890 Lymphedema, not elsewhere classified: Secondary | ICD-10-CM | POA: Insufficient documentation

## 2011-03-12 ENCOUNTER — Other Ambulatory Visit: Payer: Self-pay | Admitting: Oncology

## 2011-03-12 ENCOUNTER — Encounter (HOSPITAL_BASED_OUTPATIENT_CLINIC_OR_DEPARTMENT_OTHER): Payer: BC Managed Care – PPO | Admitting: Oncology

## 2011-03-12 DIAGNOSIS — C50919 Malignant neoplasm of unspecified site of unspecified female breast: Secondary | ICD-10-CM

## 2011-03-12 DIAGNOSIS — Z5111 Encounter for antineoplastic chemotherapy: Secondary | ICD-10-CM

## 2011-03-12 LAB — CBC WITH DIFFERENTIAL/PLATELET
EOS%: 2.2 % (ref 0.0–7.0)
Eosinophils Absolute: 0.1 10*3/uL (ref 0.0–0.5)
HGB: 11.1 g/dL — ABNORMAL LOW (ref 11.6–15.9)
MCH: 30.5 pg (ref 25.1–34.0)
MCV: 92.9 fL (ref 79.5–101.0)
MONO%: 4.1 % (ref 0.0–14.0)
NEUT#: 3.7 10*3/uL (ref 1.5–6.5)
RBC: 3.64 10*6/uL — ABNORMAL LOW (ref 3.70–5.45)
RDW: 17 % — ABNORMAL HIGH (ref 11.2–14.5)
lymph#: 0.7 10*3/uL — ABNORMAL LOW (ref 0.9–3.3)
nRBC: 0 % (ref 0–0)

## 2011-03-16 ENCOUNTER — Ambulatory Visit: Payer: BC Managed Care – PPO | Admitting: Physical Therapy

## 2011-03-18 ENCOUNTER — Ambulatory Visit: Payer: BC Managed Care – PPO | Admitting: Physical Therapy

## 2011-03-19 ENCOUNTER — Other Ambulatory Visit: Payer: Self-pay | Admitting: Oncology

## 2011-03-19 ENCOUNTER — Encounter (HOSPITAL_BASED_OUTPATIENT_CLINIC_OR_DEPARTMENT_OTHER): Payer: BC Managed Care – PPO | Admitting: Oncology

## 2011-03-19 DIAGNOSIS — R5383 Other fatigue: Secondary | ICD-10-CM

## 2011-03-19 DIAGNOSIS — R112 Nausea with vomiting, unspecified: Secondary | ICD-10-CM

## 2011-03-19 DIAGNOSIS — R5381 Other malaise: Secondary | ICD-10-CM

## 2011-03-19 DIAGNOSIS — Z170 Estrogen receptor positive status [ER+]: Secondary | ICD-10-CM

## 2011-03-19 DIAGNOSIS — Z5111 Encounter for antineoplastic chemotherapy: Secondary | ICD-10-CM

## 2011-03-19 DIAGNOSIS — C50919 Malignant neoplasm of unspecified site of unspecified female breast: Secondary | ICD-10-CM

## 2011-03-19 LAB — CBC WITH DIFFERENTIAL/PLATELET
BASO%: 0.3 % (ref 0.0–2.0)
Eosinophils Absolute: 0.1 10*3/uL (ref 0.0–0.5)
MCHC: 32.3 g/dL (ref 31.5–36.0)
MONO#: 0.3 10*3/uL (ref 0.1–0.9)
NEUT#: 3 10*3/uL (ref 1.5–6.5)
RBC: 3.59 10*6/uL — ABNORMAL LOW (ref 3.70–5.45)
WBC: 3.8 10*3/uL — ABNORMAL LOW (ref 3.9–10.3)
lymph#: 0.5 10*3/uL — ABNORMAL LOW (ref 0.9–3.3)
nRBC: 0 % (ref 0–0)

## 2011-03-20 ENCOUNTER — Ambulatory Visit: Payer: BC Managed Care – PPO | Admitting: Physical Therapy

## 2011-03-24 ENCOUNTER — Ambulatory Visit: Payer: BC Managed Care – PPO | Admitting: Physical Therapy

## 2011-03-26 ENCOUNTER — Encounter: Payer: BC Managed Care – PPO | Admitting: Physical Therapy

## 2011-03-26 ENCOUNTER — Other Ambulatory Visit: Payer: Self-pay | Admitting: Oncology

## 2011-03-26 ENCOUNTER — Encounter (HOSPITAL_BASED_OUTPATIENT_CLINIC_OR_DEPARTMENT_OTHER): Payer: BC Managed Care – PPO | Admitting: Oncology

## 2011-03-26 DIAGNOSIS — R5383 Other fatigue: Secondary | ICD-10-CM

## 2011-03-26 DIAGNOSIS — C50919 Malignant neoplasm of unspecified site of unspecified female breast: Secondary | ICD-10-CM

## 2011-03-26 LAB — CBC WITH DIFFERENTIAL/PLATELET
Basophils Absolute: 0 10*3/uL (ref 0.0–0.1)
EOS%: 1.3 % (ref 0.0–7.0)
Eosinophils Absolute: 0.1 10*3/uL (ref 0.0–0.5)
HGB: 11.5 g/dL — ABNORMAL LOW (ref 11.6–15.9)
MONO#: 0.2 10*3/uL (ref 0.1–0.9)
NEUT#: 3.8 10*3/uL (ref 1.5–6.5)
RDW: 16 % — ABNORMAL HIGH (ref 11.2–14.5)
WBC: 4.5 10*3/uL (ref 3.9–10.3)
lymph#: 0.5 10*3/uL — ABNORMAL LOW (ref 0.9–3.3)

## 2011-03-27 ENCOUNTER — Ambulatory Visit
Admission: RE | Admit: 2011-03-27 | Discharge: 2011-03-27 | Disposition: A | Discharge status: Home / Self Care | Payer: BC Managed Care – PPO | Source: Ambulatory Visit | Attending: Radiation Oncology | Admitting: Radiation Oncology

## 2011-03-27 ENCOUNTER — Encounter: Payer: BC Managed Care – PPO | Admitting: Physical Therapy

## 2011-03-27 DIAGNOSIS — C50919 Malignant neoplasm of unspecified site of unspecified female breast: Secondary | ICD-10-CM | POA: Insufficient documentation

## 2011-03-27 DIAGNOSIS — Z510 Encounter for antineoplastic radiation therapy: Secondary | ICD-10-CM | POA: Insufficient documentation

## 2011-03-27 DIAGNOSIS — L538 Other specified erythematous conditions: Secondary | ICD-10-CM | POA: Insufficient documentation

## 2011-03-27 DIAGNOSIS — M79609 Pain in unspecified limb: Secondary | ICD-10-CM | POA: Insufficient documentation

## 2011-03-27 DIAGNOSIS — G589 Mononeuropathy, unspecified: Secondary | ICD-10-CM | POA: Insufficient documentation

## 2011-04-01 ENCOUNTER — Ambulatory Visit: Payer: BC Managed Care – PPO | Attending: Oncology | Admitting: Physical Therapy

## 2011-04-01 DIAGNOSIS — IMO0001 Reserved for inherently not codable concepts without codable children: Secondary | ICD-10-CM | POA: Insufficient documentation

## 2011-04-01 DIAGNOSIS — I890 Lymphedema, not elsewhere classified: Secondary | ICD-10-CM | POA: Insufficient documentation

## 2011-04-01 DIAGNOSIS — M24519 Contracture, unspecified shoulder: Secondary | ICD-10-CM | POA: Insufficient documentation

## 2011-04-03 ENCOUNTER — Encounter: Payer: BC Managed Care – PPO | Admitting: Physical Therapy

## 2011-04-06 ENCOUNTER — Encounter: Payer: BC Managed Care – PPO | Admitting: Physical Therapy

## 2011-04-08 ENCOUNTER — Encounter: Payer: BC Managed Care – PPO | Admitting: Physical Therapy

## 2011-04-10 ENCOUNTER — Encounter: Payer: BC Managed Care – PPO | Admitting: Physical Therapy

## 2011-04-13 ENCOUNTER — Encounter: Payer: BC Managed Care – PPO | Admitting: Physical Therapy

## 2011-04-15 ENCOUNTER — Encounter: Payer: BC Managed Care – PPO | Admitting: Physical Therapy

## 2011-04-17 ENCOUNTER — Encounter: Payer: BC Managed Care – PPO | Admitting: Physical Therapy

## 2011-05-01 ENCOUNTER — Encounter: Payer: Self-pay | Admitting: *Deleted

## 2011-05-05 ENCOUNTER — Encounter: Payer: Self-pay | Admitting: *Deleted

## 2011-05-21 ENCOUNTER — Other Ambulatory Visit: Payer: Self-pay | Admitting: Oncology

## 2011-05-21 ENCOUNTER — Encounter (HOSPITAL_BASED_OUTPATIENT_CLINIC_OR_DEPARTMENT_OTHER): Payer: BC Managed Care – PPO | Admitting: Oncology

## 2011-05-21 DIAGNOSIS — Z170 Estrogen receptor positive status [ER+]: Secondary | ICD-10-CM

## 2011-05-21 DIAGNOSIS — C50919 Malignant neoplasm of unspecified site of unspecified female breast: Secondary | ICD-10-CM

## 2011-05-21 DIAGNOSIS — R5383 Other fatigue: Secondary | ICD-10-CM

## 2011-05-21 LAB — CBC WITH DIFFERENTIAL/PLATELET
Eosinophils Absolute: 0.1 10*3/uL (ref 0.0–0.5)
HCT: 38.8 % (ref 34.8–46.6)
LYMPH%: 10.1 % — ABNORMAL LOW (ref 14.0–49.7)
MCHC: 34.1 g/dL (ref 31.5–36.0)
MCV: 86.5 fL (ref 79.5–101.0)
MONO#: 0.2 10*3/uL (ref 0.1–0.9)
MONO%: 6.1 % (ref 0.0–14.0)
NEUT#: 3.3 10*3/uL (ref 1.5–6.5)
NEUT%: 81.9 % — ABNORMAL HIGH (ref 38.4–76.8)
Platelets: 169 10*3/uL (ref 145–400)
RBC: 4.49 10*6/uL (ref 3.70–5.45)
WBC: 4.1 10*3/uL (ref 3.9–10.3)

## 2011-05-22 LAB — COMPREHENSIVE METABOLIC PANEL
ALT: 30 U/L (ref 0–35)
AST: 21 U/L (ref 0–37)
Albumin: 4.4 g/dL (ref 3.5–5.2)
Alkaline Phosphatase: 82 U/L (ref 39–117)
Glucose, Bld: 116 mg/dL — ABNORMAL HIGH (ref 70–99)
Potassium: 3.8 mEq/L (ref 3.5–5.3)
Sodium: 143 mEq/L (ref 135–145)
Total Bilirubin: 0.4 mg/dL (ref 0.3–1.2)
Total Protein: 6.5 g/dL (ref 6.0–8.3)

## 2011-05-26 ENCOUNTER — Encounter (INDEPENDENT_AMBULATORY_CARE_PROVIDER_SITE_OTHER): Payer: Self-pay | Admitting: Surgery

## 2011-05-28 ENCOUNTER — Encounter (HOSPITAL_BASED_OUTPATIENT_CLINIC_OR_DEPARTMENT_OTHER): Payer: BC Managed Care – PPO | Admitting: Oncology

## 2011-05-28 DIAGNOSIS — Z5111 Encounter for antineoplastic chemotherapy: Secondary | ICD-10-CM

## 2011-05-28 DIAGNOSIS — C50919 Malignant neoplasm of unspecified site of unspecified female breast: Secondary | ICD-10-CM

## 2011-05-28 DIAGNOSIS — Z170 Estrogen receptor positive status [ER+]: Secondary | ICD-10-CM

## 2011-06-01 ENCOUNTER — Encounter (INDEPENDENT_AMBULATORY_CARE_PROVIDER_SITE_OTHER): Payer: Self-pay | Admitting: Surgery

## 2011-06-01 ENCOUNTER — Ambulatory Visit (INDEPENDENT_AMBULATORY_CARE_PROVIDER_SITE_OTHER): Payer: BC Managed Care – PPO | Admitting: Surgery

## 2011-06-01 ENCOUNTER — Ambulatory Visit
Admission: RE | Admit: 2011-06-01 | Discharge: 2011-06-01 | Disposition: A | Discharge status: Home / Self Care | Payer: BC Managed Care – PPO | Source: Ambulatory Visit | Attending: Radiation Oncology | Admitting: Radiation Oncology

## 2011-06-01 VITALS — BP 118/82 | HR 78 | Temp 97.6°F | Resp 18 | Ht 63.5 in | Wt 172.0 lb

## 2011-06-01 DIAGNOSIS — Z853 Personal history of malignant neoplasm of breast: Secondary | ICD-10-CM | POA: Insufficient documentation

## 2011-06-01 DIAGNOSIS — C50419 Malignant neoplasm of upper-outer quadrant of unspecified female breast: Secondary | ICD-10-CM

## 2011-06-01 NOTE — Progress Notes (Signed)
Subjective:     Patient ID: Brandi Bates, female   DOB: 04/12/1956, 55 y.o.   MRN: [id number]  HPI  She is here for long-term followup of her left breast cancer. Again she has had chemotherapy followed by mastectomy and Port-A-Cath insertion. She is now undergoing radiation therapy and has only 5 treatments left. She complains of some discomfort in the left axilla which is being radiated Review of Systems     Objective:   Physical Exam On examination, there skin changes from radiation. I can palpate no masses along the incision or in her axilla    Assessment:     Long-term followup left breast cancer    Plan:     From a general surgical standpoint, I will see her back in 6 months

## 2011-06-01 NOTE — Letter (Signed)
May 28, 2011     NAME:  Brandi, Bates MRN:  [id number] DOB:  05/19/1956  Carrington Clamp, M.D. [address] [place], [place] [address]  Dear Brandi Bates:  As you will see from the accompanying note, Brandi Bates is a 55 year old woman with locally advanced breast cancer in remission, who has a BRCA1 mutation of unknown significance.  She is status post hysterectomy and underwent tubal ligation remotely (in a different town and she does not recall the gynecologist's name).  My advice to her is that she have bilateral salpingo-oophorectomy at this time in case in the future it turns out that the uncertain BRCA1 mutation that she carries is deleterious (there have been only 5 families noted so far with this, and so the data base is very inconclusive).  Please let me know if you require further information.  Thank you again for helping with this patient.  As always, thanks.     Brandi Bates, M.D.  Ronna Polio  D:  05/28/2011  T:  05/30/2011  Job:  [id number]  CC:   Carrington Clamp, M.D.

## 2011-06-02 ENCOUNTER — Ambulatory Visit
Admission: RE | Admit: 2011-06-02 | Discharge: 2011-06-02 | Disposition: A | Discharge status: Home / Self Care | Payer: BC Managed Care – PPO | Source: Ambulatory Visit | Attending: Radiation Oncology | Admitting: Radiation Oncology

## 2011-06-02 DIAGNOSIS — C50419 Malignant neoplasm of upper-outer quadrant of unspecified female breast: Secondary | ICD-10-CM

## 2011-06-03 ENCOUNTER — Ambulatory Visit
Admission: RE | Admit: 2011-06-03 | Discharge: 2011-06-03 | Disposition: A | Discharge status: Home / Self Care | Payer: BC Managed Care – PPO | Source: Ambulatory Visit | Attending: Radiation Oncology | Admitting: Radiation Oncology

## 2011-06-04 ENCOUNTER — Ambulatory Visit
Admission: RE | Admit: 2011-06-04 | Discharge: 2011-06-04 | Disposition: A | Discharge status: Home / Self Care | Payer: BC Managed Care – PPO | Source: Ambulatory Visit | Attending: Radiation Oncology | Admitting: Radiation Oncology

## 2011-06-04 VITALS — Wt 171.4 lb

## 2011-06-04 DIAGNOSIS — C50419 Malignant neoplasm of upper-outer quadrant of unspecified female breast: Secondary | ICD-10-CM | POA: Diagnosis present

## 2011-06-04 NOTE — Progress Notes (Signed)
[facility] Radiation Oncology Weekly Treatment Note    Name: IJEOMA LOOR Date: 06/04/2011 MRN: [id number] DOB: 08/24/1955  Status:outpatient    Current dose: 5640  Current fraction:31  Planned dose:6040  Planned fraction:33   MEDICATIONS:Current outpatient prescriptions:HYDROcodone-acetaminophen (NORCO) 5-325 MG per tablet, Take 1 tablet by mouth as needed. 1-2 times per week, Disp: , Rfl: ;  IBUPROFEN IB PO, Take by mouth. 2 tablets 1-2times per week, Disp: , Rfl: ;  LORazepam (ATIVAN) 0.5 MG tablet, Take 1 mg by mouth every 8 (eight) hours as needed. 3-4times a week, Disp: , Rfl:    ALLERGIES: Tylox    NARRATIVE: MICAELLA GITTO was seen today for weekly treatment management. The chart was checked and port films images were reviewed. Pt doing well - skin irritation ongoing/ increased some in axilla.  PHYSICAL EXAMINATION: weight is 171 lb 6.4 oz (77.747 kg).      sclv healing well; diffuse erythema; axilla shows greatest irritation with some bright erythema/ desquamation   ASSESSMENT: Patient tolerating treatments fairly well. Expected skin irritation.   PLAN: Continue treatment as planned.

## 2011-06-05 ENCOUNTER — Ambulatory Visit
Admission: RE | Admit: 2011-06-05 | Discharge: 2011-06-05 | Disposition: A | Discharge status: Home / Self Care | Payer: BC Managed Care – PPO | Source: Ambulatory Visit | Attending: Radiation Oncology | Admitting: Radiation Oncology

## 2011-06-08 ENCOUNTER — Encounter: Payer: Self-pay | Admitting: Radiation Oncology

## 2011-06-08 ENCOUNTER — Ambulatory Visit
Admission: RE | Admit: 2011-06-08 | Discharge: 2011-06-08 | Disposition: A | Discharge status: Home / Self Care | Payer: BC Managed Care – PPO | Source: Ambulatory Visit | Attending: Radiation Oncology | Admitting: Radiation Oncology

## 2011-06-08 VITALS — Wt 170.2 lb

## 2011-06-08 DIAGNOSIS — C50419 Malignant neoplasm of upper-outer quadrant of unspecified female breast: Secondary | ICD-10-CM

## 2011-06-08 MED ORDER — HYDROCODONE-ACETAMINOPHEN 5-325 MG PO TABS: 1.0000 | ORAL_TABLET | ORAL | Status: DC | PRN

## 2011-06-08 MED ORDER — LORAZEPAM 0.5 MG PO TABS: 1.0000 mg | ORAL_TABLET | Freq: Three times a day (TID) | ORAL | Status: DC | PRN

## 2011-06-08 NOTE — Progress Notes (Addendum)
[facility] Radiation Oncology Weekly Treatment Note    Name: Brandi Bates Date: 06/08/2011 MRN: [id number] DOB: [birth year]  Status:outpatient    Current dose: 60.4 Gy  Planned dose: 60.4 Gy   ALLERGIES: Tylox    NARRATIVE: Brandi Bates was seen today for weekly treatment management. The chart was checked and port films images were reviewed. She needs refills of hydrocodone and Ativan.  Has continued skin/ chest wall pain.  PHYSICAL EXAMINATION: weight is 170 lb 3.2 oz (77.202 kg).      Chest wall shows patches of moist desquamation with diffuse erythema and dry desquamation.   ASSESSMENT:  She has tolerated RT  PLAN: Completes treatment today. Followup in one month. Will refill meds above for patient by calling [facility] in [place].

## 2011-06-13 ENCOUNTER — Encounter (HOSPITAL_COMMUNITY): Payer: Self-pay

## 2011-06-13 ENCOUNTER — Inpatient Hospital Stay (HOSPITAL_COMMUNITY)
Admission: EM | Admit: 2011-06-13 | Discharge: 2011-06-20 | DRG: 276 | Disposition: A | Discharge status: Home Health | Payer: BC Managed Care – PPO | Attending: Internal Medicine | Admitting: Internal Medicine

## 2011-06-13 DIAGNOSIS — N611 Abscess of the breast and nipple: Secondary | ICD-10-CM | POA: Diagnosis present

## 2011-06-13 DIAGNOSIS — Z9010 Acquired absence of unspecified breast and nipple: Secondary | ICD-10-CM

## 2011-06-13 DIAGNOSIS — L03319 Cellulitis of trunk, unspecified: Secondary | ICD-10-CM | POA: Diagnosis present

## 2011-06-13 DIAGNOSIS — L589 Radiodermatitis, unspecified: Secondary | ICD-10-CM | POA: Diagnosis present

## 2011-06-13 DIAGNOSIS — A4901 Methicillin susceptible Staphylococcus aureus infection, unspecified site: Secondary | ICD-10-CM | POA: Diagnosis present

## 2011-06-13 DIAGNOSIS — K5900 Constipation, unspecified: Secondary | ICD-10-CM | POA: Diagnosis present

## 2011-06-13 DIAGNOSIS — Z9221 Personal history of antineoplastic chemotherapy: Secondary | ICD-10-CM

## 2011-06-13 DIAGNOSIS — R112 Nausea with vomiting, unspecified: Secondary | ICD-10-CM | POA: Diagnosis present

## 2011-06-13 DIAGNOSIS — L0390 Cellulitis, unspecified: Secondary | ICD-10-CM | POA: Diagnosis present

## 2011-06-13 DIAGNOSIS — N61 Mastitis without abscess: Principal | ICD-10-CM | POA: Diagnosis present

## 2011-06-13 DIAGNOSIS — Y842 Radiological procedure and radiotherapy as the cause of abnormal reaction of the patient, or of later complication, without mention of misadventure at the time of the procedure: Secondary | ICD-10-CM | POA: Diagnosis present

## 2011-06-13 DIAGNOSIS — E876 Hypokalemia: Secondary | ICD-10-CM | POA: Diagnosis present

## 2011-06-13 DIAGNOSIS — Z923 Personal history of irradiation: Secondary | ICD-10-CM

## 2011-06-13 DIAGNOSIS — L02219 Cutaneous abscess of trunk, unspecified: Secondary | ICD-10-CM | POA: Diagnosis present

## 2011-06-13 DIAGNOSIS — Z853 Personal history of malignant neoplasm of breast: Secondary | ICD-10-CM | POA: Diagnosis present

## 2011-06-13 DIAGNOSIS — C50419 Malignant neoplasm of upper-outer quadrant of unspecified female breast: Secondary | ICD-10-CM | POA: Diagnosis present

## 2011-06-13 DIAGNOSIS — R509 Fever, unspecified: Secondary | ICD-10-CM | POA: Diagnosis present

## 2011-06-13 HISTORY — DX: Malignant neoplasm of unspecified site of unspecified female breast: C50.919

## 2011-06-13 LAB — CREATININE, SERUM: Creatinine, Ser: 0.64 mg/dL (ref 0.50–1.10)

## 2011-06-13 LAB — CBC
Hemoglobin: 11.4 g/dL — ABNORMAL LOW (ref 12.0–15.0)
MCH: 27.9 pg (ref 26.0–34.0)
MCH: 28.6 pg (ref 26.0–34.0)
MCHC: 32.8 g/dL (ref 30.0–36.0)
MCHC: 33.8 g/dL (ref 30.0–36.0)
Platelets: 172 10*3/uL (ref 150–400)
RDW: 15 % (ref 11.5–15.5)

## 2011-06-13 LAB — URINE MICROSCOPIC-ADD ON

## 2011-06-13 LAB — BASIC METABOLIC PANEL
BUN: 12 mg/dL (ref 6–23)
Calcium: 9.8 mg/dL (ref 8.4–10.5)
GFR calc non Af Amer: >90 mL/min (ref >90)
Glucose, Bld: 111 mg/dL — ABNORMAL HIGH (ref 70–99)

## 2011-06-13 LAB — URINALYSIS, ROUTINE W REFLEX MICROSCOPIC
Bilirubin Urine: NEGATIVE
Ketones, ur: 40 mg/dL — AB
Protein, ur: NEGATIVE mg/dL
Urobilinogen, UA: 1 mg/dL (ref 0.0–1.0)

## 2011-06-13 LAB — DIFFERENTIAL
Basophils Relative: 0 % (ref 0–1)
Eosinophils Absolute: 0 10*3/uL (ref 0.0–0.7)
Eosinophils Relative: 0 % (ref 0–5)
Monocytes Relative: 5 % (ref 3–12)
Neutrophils Relative %: 86 % — ABNORMAL HIGH (ref 43–77)

## 2011-06-13 MED ORDER — ONDANSETRON HCL 4 MG PO TABS: 4.0000 mg | ORAL_TABLET | Freq: Four times a day (QID) | ORAL | Status: DC | PRN

## 2011-06-13 MED ORDER — LEVOFLOXACIN IN D5W 500 MG/100ML IV SOLN
500.0000 mg | INTRAVENOUS | Status: DC
  Administered 2011-06-13 – 2011-06-17 (×5): 500 mg via INTRAVENOUS
  Filled 2011-06-13 (×6): qty 100

## 2011-06-13 MED ORDER — SODIUM CHLORIDE 0.9 % IV SOLN
INTRAVENOUS | Status: DC
  Administered 2011-06-13 – 2011-06-15 (×3): via INTRAVENOUS

## 2011-06-13 MED ORDER — ACETAMINOPHEN 325 MG PO TABS
650.0000 mg | ORAL_TABLET | Freq: Four times a day (QID) | ORAL | Status: DC | PRN
  Administered 2011-06-14 – 2011-06-17 (×2): 650 mg via ORAL
  Filled 2011-06-13 (×3): qty 2

## 2011-06-13 MED ORDER — SENNOSIDES-DOCUSATE SODIUM 8.6-50 MG PO TABS
1.0000 | ORAL_TABLET | Freq: Every day | ORAL | Status: DC | PRN
  Filled 2011-06-13: qty 1

## 2011-06-13 MED ORDER — HYDROMORPHONE HCL PF 1 MG/ML IJ SOLN
1.0000 mg | Freq: Once | INTRAMUSCULAR | Status: AC
  Administered 2011-06-13: 1 mg via INTRAVENOUS
  Filled 2011-06-13: qty 1

## 2011-06-13 MED ORDER — DOCUSATE SODIUM 100 MG PO CAPS
100.0000 mg | ORAL_CAPSULE | Freq: Every day | ORAL | Status: DC | PRN
  Filled 2011-06-13: qty 1

## 2011-06-13 MED ORDER — ACETAMINOPHEN 650 MG RE SUPP
650.0000 mg | Freq: Four times a day (QID) | RECTAL | Status: DC | PRN
  Administered 2011-06-16: 650 mg via RECTAL
  Filled 2011-06-13: qty 1

## 2011-06-13 MED ORDER — ONDANSETRON HCL 4 MG/2ML IJ SOLN
4.0000 mg | Freq: Once | INTRAMUSCULAR | Status: AC
  Administered 2011-06-13: 4 mg via INTRAVENOUS
  Filled 2011-06-13: qty 2

## 2011-06-13 MED ORDER — VANCOMYCIN HCL IN DEXTROSE 1-5 GM/200ML-% IV SOLN
1000.0000 mg | Freq: Two times a day (BID) | INTRAVENOUS | Status: DC
  Administered 2011-06-13 – 2011-06-16 (×7): 1000 mg via INTRAVENOUS
  Filled 2011-06-13 (×8): qty 200

## 2011-06-13 MED ORDER — HYDROCODONE-ACETAMINOPHEN 5-325 MG PO TABS
1.0000 | ORAL_TABLET | ORAL | Status: DC | PRN
  Administered 2011-06-14 – 2011-06-15 (×3): 2 via ORAL
  Administered 2011-06-17: 1 via ORAL
  Filled 2011-06-13 (×3): qty 2
  Filled 2011-06-13: qty 1

## 2011-06-13 MED ORDER — ENOXAPARIN SODIUM 40 MG/0.4ML ~~LOC~~ SOLN
40.0000 mg | SUBCUTANEOUS | Status: DC
  Administered 2011-06-13 – 2011-06-19 (×7): 40 mg via SUBCUTANEOUS
  Filled 2011-06-13 (×8): qty 0.4

## 2011-06-13 MED ORDER — ONDANSETRON HCL 4 MG/2ML IJ SOLN
4.0000 mg | Freq: Four times a day (QID) | INTRAMUSCULAR | Status: DC | PRN
  Administered 2011-06-15 – 2011-06-18 (×3): 4 mg via INTRAVENOUS
  Filled 2011-06-13 (×3): qty 2

## 2011-06-13 MED ORDER — PRUTECT EX EMUL
Freq: Three times a day (TID) | CUTANEOUS | Status: DC
  Administered 2011-06-13 – 2011-06-15 (×7): via TOPICAL
  Administered 2011-06-16: 1 via TOPICAL
  Administered 2011-06-17 – 2011-06-20 (×6): via TOPICAL
  Filled 2011-06-13 (×2): qty 45

## 2011-06-13 MED ORDER — LORAZEPAM 1 MG PO TABS: 1.0000 mg | ORAL_TABLET | Freq: Every evening | ORAL | Status: DC | PRN

## 2011-06-13 MED ORDER — HYDROMORPHONE HCL PF 1 MG/ML IJ SOLN
1.0000 mg | INTRAMUSCULAR | Status: DC | PRN
  Administered 2011-06-13 – 2011-06-18 (×30): 1 mg via INTRAVENOUS
  Filled 2011-06-13 (×30): qty 1

## 2011-06-13 MED ORDER — EMOLLIENT BASE EX CREA: 1.0000 "application " | TOPICAL_CREAM | Freq: Three times a day (TID) | CUTANEOUS | Status: DC

## 2011-06-13 MED ORDER — LORAZEPAM 1 MG PO TABS: 1.0000 mg | ORAL_TABLET | Freq: Three times a day (TID) | ORAL | Status: DC | PRN

## 2011-06-13 MED ORDER — VANCOMYCIN HCL IN DEXTROSE 1-5 GM/200ML-% IV SOLN
1000.0000 mg | Freq: Once | INTRAVENOUS | Status: AC
  Administered 2011-06-13: 1000 mg via INTRAVENOUS
  Filled 2011-06-13: qty 200

## 2011-06-13 MED ORDER — DIPHENHYDRAMINE HCL 50 MG/ML IJ SOLN
25.0000 mg | INTRAMUSCULAR | Status: DC | PRN
  Administered 2011-06-13 – 2011-06-14 (×3): 25 mg via INTRAVENOUS
  Filled 2011-06-13 (×3): qty 1

## 2011-06-13 NOTE — Progress Notes (Addendum)
ANTIBIOTIC CONSULT NOTE - INITIAL  Pharmacy Consult for Vancomycin Indication: Cellulitis  Allergies  Allergen Reactions  . Tylox Nausea And Vomiting   Patient Measurements: Height: 5\' 3"  (160 cm) Weight: 170 lb (77.111 kg) IBW/kg (Calculated) : 52.4   Vital Signs: Temp: 99.4 F (37.4 C) (11/17 1410) Temp src: Oral (11/17 1410) BP: 100/66 mmHg (11/17 1410) Pulse Rate: 84  (11/17 1410) Intake/Output from previous day:   Intake/Output from this shift:    Labs:  [facility] 06/13/11 0945  WBC 9.3  HGB 11.4*  PLT 194  LABCREA --  CREATININE 0.71   Estimated Creatinine Clearance: 79.1 ml/min (by C-G formula based on Cr of 0.71). No results found for this basename: VANCOTROUGH:2,VANCOPEAK:2,VANCORANDOM:2,GENTTROUGH:2,GENTPEAK:2,GENTRANDOM:2,TOBRATROUGH:2,TOBRAPEAK:2,TOBRARND:2,AMIKACINPEAK:2,AMIKACINTROU:2,AMIKACIN:2, in the last 72 hours   Microbiology: No results found for this or any previous visit (from the past 720 hour(s)).  Medical History: Past Medical History  Diagnosis Date  . Breast cancer left sided     s/p mastectomy, chemo and radiation  . Migraines   . Numbness of feet   . GERD (gastroesophageal reflux disease)   . Chronic low back pain   . Eczema    Medications:  Prescriptions prior to admission  Medication Sig Dispense Refill  . B Complex-C (B-COMPLEX WITH VITAMIN C) tablet Take 1 tablet by mouth daily.        Marland Kitchen emollient (BIAFINE) cream Apply 1 application topically 3 (three) times daily.        Marland Kitchen HYDROcodone-acetaminophen (NORCO) 5-325 MG per tablet Take 1 tablet by mouth as needed. For pain       . IBUPROFEN IB PO Take 3 tablets by mouth every 8 (eight) hours as needed. For pain/fever      . levofloxacin (LEVAQUIN) 750 MG tablet Take 750 mg by mouth daily. Patient only has 2 doses of 7 day therapy course       . LORazepam (ATIVAN) 0.5 MG tablet Take 1 mg by mouth at bedtime as needed. For sleep       . ondansetron (ZOFRAN) 4 MG tablet Take 4 mg  by mouth every 4 (four) hours as needed.        Marland Kitchen oseltamivir (TAMIFLU) 75 MG capsule Take 75 mg by mouth 2 (two) times daily. 5 day therapy course patient only has had 1 dose        Assessment: Cellulitis at left-chest radiation site unimproved after 2 days of outpatient Levaquin. Adding Vancomycin to Levaquin.   Goal of Therapy:  Vancomycin trough level 10-15 mcg/ml  Plan:  Vancomycin 1g IV q12h. Measure antibiotic drug levels at steady state.  Reece Packer, PharmD, pager [phone]. 06/13/2011,3:01 PM.

## 2011-06-13 NOTE — H&P (Signed)
PCP: none Oncologist- Dr Magrinot Radiation Oncologist- Dr Mitzi Hansen   Chief Complaint:  Fevers and pain in left chest  HPI: This is a 55 year old female left-sided breast cancer in March 2012. She underwent a mastectomy, chemotherapy and radiation. She just completed her radiation treatments this past week.  Patient comes in with a complaint of fevers and pain in her left chest. She states that 2 days ago she awoke with a fever which was 101.4. She noticed pain in the left side of her chest where she had been receiving her radiation treatments in the area of her prior incision. In addition she had severe nausea and retching. She went to urgent care and it was suspected that she may have an infection and was given " a shot of an antibiotic"discharged with prescriptions for Tamiflu and Levaquin.   She continued to have symptoms of nausea and retching and was unable to take by mouth medications. The pain in the left side of her chest increased and she continued to have low-grade fevers.  When she checked her temperatures last night they were about 99. Coming to the hospital today because she was not getting any better.  Review of Systems:  General : fever, chills, fatigue. No weight changes.   HEENT : No blurred vision or double vision. No sinus trouble sore throat or earache. Lungs: No shortness of breath or cough. Heart: No palpitations chest pain or pedal edema  GI: Nausea Retching constipation and lower abdominal tenderness  GU: No dysuria or hematuria Neurologic: No seizures or history of stroke. Psychologic: No anxiety or depression. Hematologic: Positive for easy bruising. Skin: Positive for a rash in the area where she was receiving radiation.   Past Medical History: Past Medical History  Diagnosis Date  . Breast cancer left sided     s/p mastectomy, chemo and radiation  . Migraines   . Numbness of feet   . GERD (gastroesophageal reflux disease)   . Chronic low back pain   .  Eczema    Past Surgical History  Procedure Date  . Tubal ligation   . Abdominal hysterectomy   . Lap Cholecystectomy   . 10/24/10 left modified radical mastectomy   .      Medications: Prior to Admission medications   Medication Sig Start Date End Date Taking? Authorizing Provider  B Complex-C (B-COMPLEX WITH VITAMIN C) tablet Take 1 tablet by mouth daily.     Yes Historical Provider, MD  emollient (BIAFINE) cream Apply 1 application topically 3 (three) times daily.     Yes Historical Provider, MD  HYDROcodone-acetaminophen (NORCO) 5-325 MG per tablet Take 1 tablet by mouth as needed. For pain  06/08/11  Yes Buckner Malta, MD  IBUPROFEN IB PO Take 3 tablets by mouth every 8 (eight) hours as needed. For pain/fever   Yes Historical Provider, MD  levofloxacin (LEVAQUIN) 750 MG tablet Take 750 mg by mouth daily. Patient only has 2 doses of 7 day therapy course  06/11/11 06/18/11 Yes Historical Provider, MD  LORazepam (ATIVAN) 0.5 MG tablet Take 1 mg by mouth at bedtime as needed. For sleep  06/08/11  Yes Buckner Malta, MD  ondansetron (ZOFRAN) 4 MG tablet Take 4 mg by mouth every 4 (four) hours as needed.     Yes Historical Provider, MD  oseltamivir (TAMIFLU) 75 MG capsule Take 75 mg by mouth 2 (two) times daily. 5 day therapy course patient only has had 1 dose  06/11/11  Yes Historical Provider, MD  Allergies:   Allergies  Allergen Reactions  . Tylox Nausea And Vomiting    Social History:  reports that she quit smoking about 14 years ago. She does not have any smokeless tobacco history on file. She reports that she does not drink alcohol or use illicit drugs. e lives with her husband. she has 2 children, was working in an office.  Family History: Family History  Problem Relation Age of Onset  . Cancer Mother     lung  . Cancer Father     lung  . Cancer Brother     BRAIN CANCER  . Cancer Cousin      2 PATERNAL COUSINS - BREAST CA    Physical Exam: Filed  Vitals:   06/13/11 0855 06/13/11 1205  BP: 106/61 116/60  Pulse: 95 84  Temp: 98.8 F (37.1 C) 98.4 F (36.9 C)  TempSrc: Oral Oral  Resp: 18 20  SpO2: 100% 98%   General appearance: alert and cooperative Head: Normocephalic, without obvious abnormality, atraumatic Nose: Nares normal. Septum midline. Mucosa normal. No drainage or sinus tenderness., no discharge Throat: lips, mucosa, and tongue normal; teeth and gums normal Neck: no adenopathy, no carotid bruit and thyroid not enlarged, symmetric, no tenderness/mass/nodules Resp: clear to auscultation bilaterally Chest wall: left sided chest wall tenderness (see skin exam below) GI: soft, non-tender; bowel sounds normal; no masses,  no organomegaly Extremities: extremities normal, atraumatic, no cyanosis or edema Skin: The area around her mastectomy is erythematous, warm and very tender to the touch. Skin is desquamating. There is not noted mass or fluctuation  to suggest and abscess. Neurologic: Grossly normal Incision/Wound:   Labs on Admission:   [facility] 06/13/11 0945  NA 137  K 3.3*  CL 100  CO2 24  GLUCOSE 111*  BUN 12  CREATININE 0.71  CALCIUM 9.8  MG --  PHOS --   No results found for this basename: AST:2,ALT:2,ALKPHOS:2,BILITOT:2,PROT:2,ALBUMIN:2 in the last 72 hours No results found for this basename: LIPASE:2,AMYLASE:2 in the last 72 hours  Basename 06/13/11 0945  WBC 9.3  NEUTROABS 8.0*  HGB 11.4*  HCT 34.8*  MCV 85.3  PLT 194   No results found for this basename: CKTOTAL:3,CKMB:3,CKMBINDEX:3,TROPONINI:3 in the last 72 hours No results found for this basename: TSH,T4TOTAL,FREET3,T3FREE,THYROIDAB in the last 72 hours No results found for this basename: VITAMINB12:2,FOLATE:2,FERRITIN:2,TIBC:2,IRON:2,RETICCTPCT:2 in the last 72 hours  Radiological Exams on Admission: No results found.  EKG: No orders found for this or any previous visit.  Assessment/Plan Principal Problem:  *Cellulitis  superimposed on  Radiation-induced dermatitis Vancomycin (per pharmacy) and Levaquin initiated. Pain control w/ Dilaudid and Vicodin (takes this as oupt). Continue emollient cream to the area as tolerated.   Active Problems: Fevers- due to above Nausea and Retching- IV Zofran PRN  Hypokalemia- Replace IV and recheck in AM.  Left sided Breast Cancer s/p Mastectomy, chemo and radiation    [facility] 06/13/2011, 12:15 PM

## 2011-06-13 NOTE — ED Provider Notes (Signed)
History     CSN: [id number] Arrival date & time: 06/13/2011  8:49 AM   First MD Initiated Contact with Patient 06/13/11 [phone]      Chief Complaint  Patient presents with  . Fever    pt states fever and nausea x3 days with hx of left breast ca states radiation tx monday states pain at the site 10/10     Patient is a 55 y.o. female presenting with fever. The history is provided by the patient and a relative.  Fever Primary symptoms of the febrile illness include fever, fatigue, cough, shortness of breath and nausea. Primary symptoms do not include vomiting, diarrhea, dysuria or altered mental status. The current episode started 2 days ago. This is a new problem. The problem has been gradually worsening.  Associated with: radiation therapy.  Pt has h/o breast ca s/p left mastectomy, just finished up last radiation therapy last week, presenting with fever/nausea/sob/abd pain/headache and increased pain on left breast for about 2 days Seen at an urgent care this week, placed on levaquin and tamiflu but no improvement She reports she had "normal" CXR at that time She recorded at temp at 101.4 at home   Past Medical History  Diagnosis Date  . Cancer   . Migraines   . Numbness of feet   . GERD (gastroesophageal reflux disease)   . Chronic low back pain   . Eczema     Past Surgical History  Procedure Date  . Tubal ligation   . Abdominal hysterectomy   . Gallbladder surgery   . 10/24/10 left modified radical mastectomy   . Breast surgery     Family History  Problem Relation Age of Onset  . Cancer Mother     lung  . Cancer Father     lung  . Cancer Brother     BRAIN CANCER  . Cancer Cousin      2 PATERNAL COUSINS - BREAST CA    History  Substance Use Topics  . Smoking status: Former Smoker    Quit date: 05/31/1997  . Smokeless tobacco: Not on file  . Alcohol Use: No    OB History    Grav Para Term Preterm Abortions TAB SAB Ect Mult Living                   Review of Systems  Constitutional: Positive for fever and fatigue.  Respiratory: Positive for cough and shortness of breath.   Gastrointestinal: Positive for nausea. Negative for vomiting and diarrhea.  Genitourinary: Negative for dysuria.  Psychiatric/Behavioral: Negative for altered mental status.  All other systems reviewed and are negative.    Allergies  Tylox  Home Medications   Current Outpatient Rx  Name Route Sig Dispense Refill  . B COMPLEX-C PO TABS Oral Take 1 tablet by mouth daily.      Marland Kitchen EMOLLIENT BASE EX CREA Topical Apply 1 application topically 3 (three) times daily.      Marland Kitchen HYDROCODONE-ACETAMINOPHEN 5-325 MG PO TABS Oral Take 1 tablet by mouth as needed. For pain     . IBUPROFEN IB PO Oral Take 3 tablets by mouth every 8 (eight) hours as needed. For pain/fever    . LEVOFLOXACIN 750 MG PO TABS Oral Take 750 mg by mouth daily. Patient only has 2 doses of 7 day therapy course     . LORAZEPAM 0.5 MG PO TABS Oral Take 1 mg by mouth at bedtime as needed. For sleep     .  ONDANSETRON HCL 4 MG PO TABS Oral Take 4 mg by mouth every 4 (four) hours as needed.      . OSELTAMIVIR PHOSPHATE 75 MG PO CAPS Oral Take 75 mg by mouth 2 (two) times daily. 5 day therapy course patient only has had 1 dose       BP 106/61  Pulse 95  Temp(Src) 98.8 F (37.1 C) (Oral)  Resp 18  SpO2 100%  Physical Exam  CONSTITUTIONAL: Well developed/well nourished, uncomfortable appearing HEAD AND FACE: Normocephalic/atraumatic EYES: EOMI/PERRL ENMT: Mucous membranes dry NECK: supple no meningeal signsr CV: S1/S2 noted, no murmurs/rubs/gallops noted LUNGS: Lungs are clear to auscultation bilaterally, no apparent distress Chest - erythema/tenderness noted to left mastectomy site.  No abscess.  No drainage noted ABDOMEN: soft, nontender, no rebound or guarding, +BS GU:no cva tenderness NEURO: Pt is awake/alert, moves all extremitiesx4 EXTREMITIES: pulses normal, full ROM SKIN: warm, color  normal PSYCH: no abnormalities of mood noted   ED Course  Procedures   Labs Reviewed  BASIC METABOLIC PANEL  CBC  DIFFERENTIAL  URINALYSIS, ROUTINE W REFLEX MICROSCOPIC     9:58 AM Pt with increased nausea, fever at home.  ?cellulitis to left mastectomy site Already on levaquin/tamiflu from outside provider  11:34 AM Pt with continued pain Will tx as cellulitis cxr deferred as had recent negative CXR per family D/w dr Butler [place], to admit  MDM  Nursing notes reviewed and considered in documentation All labs/vitals reviewed and considered           Joya Gaskins, MD 06/13/11 1134

## 2011-06-14 DIAGNOSIS — L02219 Cutaneous abscess of trunk, unspecified: Secondary | ICD-10-CM

## 2011-06-14 DIAGNOSIS — L03319 Cellulitis of trunk, unspecified: Secondary | ICD-10-CM

## 2011-06-14 DIAGNOSIS — N61 Mastitis without abscess: Secondary | ICD-10-CM

## 2011-06-14 LAB — BASIC METABOLIC PANEL
BUN: 12 mg/dL (ref 6–23)
Calcium: 9.1 mg/dL (ref 8.4–10.5)
GFR calc non Af Amer: >90 mL/min (ref >90)
Glucose, Bld: 109 mg/dL — ABNORMAL HIGH (ref 70–99)

## 2011-06-14 MED ORDER — IBUPROFEN 600 MG PO TABS
600.0000 mg | ORAL_TABLET | Freq: Four times a day (QID) | ORAL | Status: DC | PRN
  Administered 2011-06-14 – 2011-06-15 (×3): 600 mg via ORAL
  Filled 2011-06-14 (×3): qty 1

## 2011-06-14 MED ORDER — POTASSIUM CHLORIDE CRYS ER 20 MEQ PO TBCR
40.0000 meq | EXTENDED_RELEASE_TABLET | Freq: Once | ORAL | Status: AC
  Administered 2011-06-14: 40 meq via ORAL
  Filled 2011-06-14: qty 2

## 2011-06-14 MED ORDER — SENNOSIDES-DOCUSATE SODIUM 8.6-50 MG PO TABS
2.0000 | ORAL_TABLET | Freq: Every evening | ORAL | Status: DC | PRN
  Administered 2011-06-14 – 2011-06-19 (×4): 2 via ORAL
  Filled 2011-06-14 (×4): qty 2

## 2011-06-14 NOTE — Progress Notes (Signed)
Subjective: Patient says that pain in her left breast area is better. Nausea is better. She has tolerated some soup.  Review of systems: Constipation. Patient is passing flatus.  Objective: Blood pressure 98/50, pulse 86, temperature 101 F (38.3 C), temperature source Oral, resp. rate 20, height 5\' 3"  (1.6 m), weight 77.111 kg (170 lb), SpO2 96.00%.  Intake/Output Summary (Last 24 hours) at 06/14/11 1144 Last data filed at 06/14/11 0600  Gross per 24 hour  Intake   2000 ml  Output      0 ml  Net   2000 ml   General exam: Patient is comfortable and in no distress. Respiratory system: Clear. Cardiovascular system: First and second heart sounds heard, regular. Skin: A day off the skin lateral aspect of the left mastectomy and in the infra-axillary region shows erythema, swelling, tenderness and warmth. There is in distinct area of fluctuation. No obvious pointing is visible. Gastrointestinal system: Abdomen is nondistended, soft and normal bowel sounds heard. Nontender. Central nervous system: Alert and oriented. No focal neurological deficits. Extremities: 5 x 5 power symmetrically.  Lab Results: Basic Metabolic Panel:  Basename 06/14/11 0455 06/13/11 1536 06/13/11 0945  NA 138 -- 137  K 3.4* -- 3.3*  CL 105 -- 100  CO2 26 -- 24  GLUCOSE 109* -- 111*  BUN 12 -- 12  CREATININE 0.76 0.64 --  CALCIUM 9.1 -- 9.8  MG -- -- --  PHOS -- -- --   Liver Function Tests: No results found for this basename: AST:2,ALT:2,ALKPHOS:2,BILITOT:2,PROT:2,ALBUMIN:2 in the last 72 hours No results found for this basename: LIPASE:2,AMYLASE:2 in the last 72 hours No results found for this basename: AMMONIA:2 in the last 72 hours CBC:  Basename 06/13/11 1536 06/13/11 0945  WBC 6.7 9.3  NEUTROABS -- 8.0*  HGB 10.4* 11.4*  HCT 30.8* 34.8*  MCV 84.6 85.3  PLT 172 194   Cardiac Enzymes: No results found for this basename: CKTOTAL:3,CKMB:3,CKMBINDEX:3,TROPONINI:3 in the last 72 hours BNP: No  results found for this basename: POCBNP:3 in the last 72 hours D-Dimer: No results found for this basename: DDIMER:2 in the last 72 hours CBG: No results found for this basename: GLUCAP:6 in the last 72 hours Hemoglobin A1C: No results found for this basename: HGBA1C in the last 72 hours Fasting Lipid Panel: No results found for this basename: CHOL,HDL,LDLCALC,TRIG,CHOLHDL,LDLDIRECT in the last 72 hours Thyroid Function Tests: No results found for this basename: TSH,T4TOTAL,FREET4,T3FREE,THYROIDAB in the last 72 hours Anemia Panel: No results found for this basename: VITAMINB12,FOLATE,FERRITIN,TIBC,IRON,RETICCTPCT in the last 72 hours Coagulation: No results found for this basename: LABPROT:2,INR:2 in the last 72 hours Urine Drug Screen:  Alcohol Level: No results found for this basename: ETH:2 in the last 72 hours Urinalysis:  Misc. Labs:   Micro Results: No results found for this or any previous visit (from the past 240 hour(s)).  Studies/Results: No results found.  Medications: Scheduled Meds:   . enoxaparin  40 mg Subcutaneous Q24H  . levofloxacin (LEVAQUIN) IV  500 mg Intravenous Q24H  . PRUTECT   Topical TID  . vancomycin  1,000 mg Intravenous Once  . vancomycin  1,000 mg Intravenous Q12H  . DISCONTD: emollient  1 application Topical TID   Continuous Infusions:   . sodium chloride 100 mL/hr at 06/14/11 0852   PRN Meds:.acetaminophen, acetaminophen, diphenhydrAMINE, docusate sodium, HYDROcodone-acetaminophen, HYDROmorphone, ibuprofen, LORazepam, LORazepam, ondansetron (ZOFRAN) IV, ondansetron, senna-docusate, DISCONTD: senna-docusate  Assessment/Plan: 1. Cellulitis complicating underlying radiation-induced dermatitis in the area of left mastectomy and axillary region.  Rule out abscess: Continue intravenous levofloxacin and vancomycin. General surgery consulted. 2. Hypokalemia: Replete and follow daily BMP. 3. Nausea and dry heaves: Clinically better. Advance diet  as tolerated.  4. Anemia: Possibly chronic disease and from chemotherapy. Monitor daily CBCs. 5. Left breast cancer status post mastectomy and chemotherapy and ongoing radiation treatment.   Brandi Bates 06/14/2011, 11:44 AM

## 2011-06-14 NOTE — Plan of Care (Signed)
Problem: Consults Goal: Skin Care Protocol Initiated - if indicated If consults are not indicated, leave blank or document N/A  Vancomycin, and Levaquin as antibiotics, with prutect emulsion cream.

## 2011-06-14 NOTE — Procedures (Signed)
Left mastectomy fluid collection aspirated with sterile technique.  45cc of serous then purulent fluid obtained with complete resolution.  Sent for C&S  Mariella Saa MD, FACS  06/14/2011, 4:57 PM

## 2011-06-14 NOTE — Consult Note (Signed)
Chief complaint: Pain and swelling at mastectomy site  History: I was asked by Dr. Waymon Amato to evaluate the patient. She is a 55 year old female who is status post left modified mastectomy by Dr. Rayburn Ma of our practice. She has just completed her postoperative radiation to the left chest wall. She has had some discomfort under her axilla throughout treatment. However over the past 4-5 days she has developed increasing pain associated with increasing redness and swelling at the lateral aspect of her mastectomy incision. She also recently developed a fever up to 101. The patient was admitted by the hospitalist team and we are asked to see her in consultation.  Past Medical History  Diagnosis Date  . Breast cancer left sided     s/p mastectomy, chemo and radiation  . Migraines   . Numbness of feet   . GERD (gastroesophageal reflux disease)   . Chronic low back pain   . Eczema    Past Surgical History  Procedure Date  . Tubal ligation   . Abdominal hysterectomy   . Gallbladder surgery   . 10/24/10 left modified radical mastectomy   . Breast surgery    Current Facility-Administered Medications  Medication Dose Route Frequency Provider Last Rate Last Dose  . 0.9 %  sodium chloride infusion   Intravenous Continuous Anand Hongalgi 100 mL/hr at 06/14/11 0852    . acetaminophen (TYLENOL) tablet 650 mg  650 mg Oral Q6H PRN Saima Rizwan       Or  . acetaminophen (TYLENOL) suppository 650 mg  650 mg Rectal Q6H PRN Saima Rizwan      . diphenhydrAMINE (BENADRYL) injection 25 mg  25 mg Intravenous Q4H PRN Saima Rizwan   25 mg at 06/14/11 0646  . docusate sodium (COLACE) capsule 100 mg  100 mg Oral Daily PRN Saima Rizwan      . enoxaparin (LOVENOX) injection 40 mg  40 mg Subcutaneous Q24H Saima Rizwan   40 mg at 06/13/11 1630  . HYDROcodone-acetaminophen (NORCO) 5-325 MG per tablet 1-2 tablet  1-2 tablet Oral Q4H PRN Saima Rizwan      . HYDROmorphone (DILAUDID) injection 1 mg  1 mg Intravenous Q3H  PRN Saima Rizwan   1 mg at 06/14/11 1022  . ibuprofen (ADVIL,MOTRIN) tablet 600 mg  600 mg Oral Q6H PRN Mary A. Lynch   600 mg at 06/14/11 1610  . levofloxacin (LEVAQUIN) IVPB 500 mg  500 mg Intravenous Q24H Saima Rizwan   500 mg at 06/13/11 1313  . LORazepam (ATIVAN) tablet 1 mg  1 mg Oral QHS PRN Saima Rizwan      . LORazepam (ATIVAN) tablet 1 mg  1 mg Oral Q8H PRN Saima Rizwan      . ondansetron (ZOFRAN) tablet 4 mg  4 mg Oral Q6H PRN Saima Rizwan       Or  . ondansetron (ZOFRAN) injection 4 mg  4 mg Intravenous Q6H PRN Saima Rizwan      . potassium chloride SA (K-DUR,KLOR-CON) CR tablet 40 mEq  40 mEq Oral Once [employer]      . PRUTECT emulsion   Topical TID Saima Rizwan      . senna-docusate (Senokot-S) tablet 2 tablet  2 tablet Oral QHS PRN Anand Hongalgi      . vancomycin (VANCOCIN) IVPB 1000 mg/200 mL premix  1,000 mg Intravenous Once Joya Gaskins, MD   1,000 mg at 06/13/11 1153  . vancomycin (VANCOCIN) IVPB 1000 mg/200 mL premix  1,000 mg Intravenous Q12H Edrick Oh  [place], PHARMD   1,000 mg at 06/14/11 0328  . DISCONTD: emollient (BIAFINE) cream 1 application  1 application Topical TID Saima Rizwan      . DISCONTD: senna-docusate (Senokot-S) tablet 1 tablet  1 tablet Oral Daily PRN Saima Rizwan       Allergies  Allergen Reactions  . Tylox Nausea And Vomiting   Exam: Gen.: Well-developed Caucasian female in no distress Skin: Generally warm and dry Lungs: Clear without wheezing or increased work of breathing Lymph nodes: No cervical or supraclavicular nodes palpable Chest wall: Status post left mastectomy. There is moderate erythema of the left chest wall consistent with recent radiation treatments. However at the lateral aspect of the incision is a approximately 7 cm area of increased erythema and some swelling and obvious palpable fluid beneath the flaps.  CBC    Component Value Date/Time   WBC 6.7 06/13/2011 1536   WBC 4.1 05/21/2011 0900   RBC 3.64*  06/13/2011 1536   HGB 10.4* 06/13/2011 1536   HGB 13.3 05/21/2011 0900   HCT 30.8* 06/13/2011 1536   HCT 38.8 05/21/2011 0900   PLT 172 06/13/2011 1536   PLT 169 05/21/2011 0900   MCV 84.6 06/13/2011 1536   MCV 86.5 05/21/2011 0900   MCH 28.6 06/13/2011 1536   MCHC 33.8 06/13/2011 1536   RDW 15.0 06/13/2011 1536   LYMPHSABS 0.8 06/13/2011 0945   MONOABS 0.5 06/13/2011 0945   EOSABS 0.0 06/13/2011 0945   EOSABS 0.1 05/21/2011 0900   BASOSABS 0.0 06/13/2011 0945   BASOSABS 0.0 05/21/2011 0900     Assessment and plan: apparent cellulitis of left chest wall status post mastectomy and radiation. There is a fluid collection which could represent seroma or abscess. The patient is currently on appropriate broad-spectrum antibiotics. Later today I will plan to aspirate the fluid collection for evaluation and treatment. Follow with you.  Mariella Saa MD, FACS  06/14/2011, 12:09 PM

## 2011-06-15 LAB — DIFFERENTIAL
Basophils Relative: 0 % (ref 0–1)
Eosinophils Absolute: 0.1 10*3/uL (ref 0.0–0.7)
Monocytes Relative: 9 % (ref 3–12)
Neutrophils Relative %: 79 % — ABNORMAL HIGH (ref 43–77)

## 2011-06-15 LAB — BASIC METABOLIC PANEL
Calcium: 8.5 mg/dL (ref 8.4–10.5)
GFR calc non Af Amer: >90 mL/min (ref >90)
Glucose, Bld: 96 mg/dL (ref 70–99)
Sodium: 139 mEq/L (ref 135–145)

## 2011-06-15 LAB — CBC
Hemoglobin: 8.6 g/dL — ABNORMAL LOW (ref 12.0–15.0)
MCH: 28.1 pg (ref 26.0–34.0)
MCHC: 32.6 g/dL (ref 30.0–36.0)
Platelets: 174 10*3/uL (ref 150–400)

## 2011-06-15 MED ORDER — BISACODYL 10 MG RE SUPP
10.0000 mg | Freq: Every day | RECTAL | Status: DC | PRN
  Administered 2011-06-15: 10 mg via RECTAL
  Filled 2011-06-15: qty 1

## 2011-06-15 NOTE — Progress Notes (Signed)
Subjective: Patient says that pain and pressure at the left mastectomy and arm pit site improved after aspiration by surgeons. This morning however she rates her pain at that the area as 10 over 10 which decreases to 4 x 10 after pain medications. She denies dyspnea. She is tolerating more food. She still has not had a BM.   Review of systems: No history of bleeding, melena.  Objective: Blood pressure 102/57, pulse 79, temperature 98.2 F (36.8 C), temperature source Oral, resp. rate 18, height 5\' 3"  (1.6 m), weight 77.111 kg (170 lb), SpO2 91.00%.  Intake/Output Summary (Last 24 hours) at 06/15/11 1108 Last data filed at 06/15/11 0900  Gross per 24 hour  Intake 7310.83 ml  Output      0 ml  Net 7310.83 ml   General exam: Patient is comfortable and in no distress. Respiratory system: Clear. Cardiovascular system: First and second heart sounds heard, regular. Skin: The lateral aspect of the left mastectomy site and the axilla region looks less erythematous, is less warm and less tender. The swelling seems to have increased again after aspiration. Still has some fluctuance in the center. No obvious drainage. Gastrointestinal system: Abdomen is nondistended, soft and normal bowel sounds heard. Nontender. Central nervous system: Alert and oriented. No focal neurological deficits. Extremities: 5 x 5 power symmetrically.  Lab Results: Basic Metabolic Panel:  Basename 06/15/11 0420 06/14/11 0455  NA 139 138  K 3.5 3.4*  CL 108 105  CO2 22 26  GLUCOSE 96 109*  BUN 10 12  CREATININE 0.63 0.76  CALCIUM 8.5 9.1  MG 1.9 --  PHOS -- --   Liver Function Tests: No results found for this basename: AST:2,ALT:2,ALKPHOS:2,BILITOT:2,PROT:2,ALBUMIN:2 in the last 72 hours No results found for this basename: LIPASE:2,AMYLASE:2 in the last 72 hours No results found for this basename: AMMONIA:2 in the last 72 hours CBC:  Basename 06/15/11 0420 06/13/11 1536 06/13/11 0945  WBC 4.1 6.7 --    NEUTROABS 3.2 -- 8.0*  HGB 8.6* 10.4* --  HCT 26.4* 30.8* --  MCV 86.3 84.6 --  PLT 174 172 --   Cardiac Enzymes: No results found for this basename: CKTOTAL:3,CKMB:3,CKMBINDEX:3,TROPONINI:3 in the last 72 hours BNP: No results found for this basename: POCBNP:3 in the last 72 hours D-Dimer: No results found for this basename: DDIMER:2 in the last 72 hours CBG: No results found for this basename: GLUCAP:6 in the last 72 hours Hemoglobin A1C: No results found for this basename: HGBA1C in the last 72 hours Fasting Lipid Panel: No results found for this basename: CHOL,HDL,LDLCALC,TRIG,CHOLHDL,LDLDIRECT in the last 72 hours Thyroid Function Tests: No results found for this basename: TSH,T4TOTAL,FREET4,T3FREE,THYROIDAB in the last 72 hours Anemia Panel: No results found for this basename: VITAMINB12,FOLATE,FERRITIN,TIBC,IRON,RETICCTPCT in the last 72 hours Coagulation: No results found for this basename: LABPROT:2,INR:2 in the last 72 hours Urine Drug Screen:  Alcohol Level: No results found for this basename: ETH:2 in the last 72 hours Urinalysis:  Misc. Labs:   Micro Results: Recent Results (from the past 240 hour(s))  CULTURE, ROUTINE-ABSCESS     Status: Normal (Preliminary result)   Collection Time   06/14/11  5:08 PM      Component Value Range Status Comment   Specimen Description ABSCESS LEFT BREAST   Final    Special Requests NONE   Final    Gram Stain PENDING   Incomplete    Culture NO GROWTH 1 DAY   Final    Report Status PENDING   Incomplete  Studies/Results: No results found.  Medications: Scheduled Meds:    . enoxaparin  40 mg Subcutaneous Q24H  . levofloxacin (LEVAQUIN) IV  500 mg Intravenous Q24H  . potassium chloride  40 mEq Oral Once  . PRUTECT   Topical TID  . vancomycin  1,000 mg Intravenous Q12H   Continuous Infusions:    . sodium chloride 75 mL/hr at 06/15/11 0130   PRN Meds:.acetaminophen, acetaminophen, diphenhydrAMINE, docusate  sodium, HYDROcodone-acetaminophen, HYDROmorphone, ibuprofen, LORazepam, LORazepam, ondansetron (ZOFRAN) IV, ondansetron, senna-docusate, DISCONTD: senna-docusate  Assessment/Plan: 1. 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 Surgeon's input is appreciated. They plan to incise and drain the abscess. Continue intravenous levofloxacin and vancomycin.  2. Hypokalemia: Repleted 3. Anemia: As per her oncologist, patient's hemoglobin was in the 13 g/dL range on October 24. Some of this may be dilutional. No overt bleeding. Will check stool for occult blood. Will follow CBCs tomorrow. Transfuse if hemoglobin drops to less than 7 g/dL. Patient has not had chemotherapy since August of 2012. 4. Nausea and dry heaves: Clinically better. Advance diet as tolerated.  5. Left breast cancer status post mastectomy and chemotherapy and ongoing radiation treatment.   Brandi Bates 06/15/2011, 11:08 AM

## 2011-06-15 NOTE — Progress Notes (Signed)
Subjective: Site is fluctuant L upper lateral aspect of chest.2gm hbg drop since addmisson, pt not eating 3 days prior to admit.She has completed 14 Chemo Rx, in Aug, and Radiation last week. No growth on culture so far.  Objective: Vital signs in last 24 hours: Temp:  [98.1 F (36.7 C)-100.1 F (37.8 C)] 98.2 F (36.8 C) (11/19 0843) Pulse Rate:  [79-85] 79  (11/19 0843) Resp:  [18-20] 18  (11/19 0453) BP: (92-102)/(42-68) 102/57 mmHg (11/19 0843) SpO2:  [90 %-98 %] 91 % (11/19 0453) Last BM Date: 06/11/11  Intake/Output from previous day: 11/18 0701 - 11/19 0700 In: 950.8 [I.V.:950.8] Out: -  Intake/Output this shift: Total I/O In: 6360 [P.O.:6360] Out: -   General appearance: alert, cooperative and no distress Chest wall: L upper lateral aspect of mastectom site erythematous, and has a fluctuant collection below the skin  Lab Results:   [facility] 06/15/11 0420 06/13/11 1536  WBC 4.1 6.7  HGB 8.6* 10.4*  HCT 26.4* 30.8*  PLT 174 172    BMET  Basename 06/15/11 0420 06/14/11 0455  NA 139 138  K 3.5 3.4*  CL 108 105  CO2 22 26  GLUCOSE 96 109*  BUN 10 12  CREATININE 0.63 0.76  CALCIUM 8.5 9.1   PT/INR No results found for this basename: LABPROT:2,INR:2 in the last 72 hours   Studies/Results: No results found.  Anti-infectives: Anti-infectives     Start     Dose/Rate Route Frequency Ordered Stop   06/13/11 1600   vancomycin (VANCOCIN) IVPB 1000 mg/200 mL premix        1,000 mg 200 mL/hr over 60 Minutes Intravenous Every 12 hours 06/13/11 1504     06/13/11 1215   levofloxacin (LEVAQUIN) IVPB 500 mg        500 mg 100 mL/hr over 60 Minutes Intravenous Every 24 hours 06/13/11 1214     06/13/11 1200   vancomycin (VANCOCIN) IVPB 1000 mg/200 mL premix        1,000 mg 200 mL/hr over 60 Minutes Intravenous  Once 06/13/11 1111 06/13/11 1253         Current Facility-Administered Medications  Medication Dose Route Frequency Provider Last Rate Last Dose    . 0.9 %  sodium chloride infusion   Intravenous Continuous Anand Hongalgi 75 mL/hr at 06/15/11 0130    . acetaminophen (TYLENOL) tablet 650 mg  650 mg Oral Q6H PRN Saima Rizwan   650 mg at 06/14/11 2043   Or  . acetaminophen (TYLENOL) suppository 650 mg  650 mg Rectal Q6H PRN Saima Rizwan      . diphenhydrAMINE (BENADRYL) injection 25 mg  25 mg Intravenous Q4H PRN Saima Rizwan   25 mg at 06/14/11 1422  . docusate sodium (COLACE) capsule 100 mg  100 mg Oral Daily PRN Saima Rizwan      . enoxaparin (LOVENOX) injection 40 mg  40 mg Subcutaneous Q24H Saima Rizwan   40 mg at 06/14/11 1627  . HYDROcodone-acetaminophen (NORCO) 5-325 MG per tablet 1-2 tablet  1-2 tablet Oral Q4H PRN Saima Rizwan   2 tablet at 06/15/11 0857  . HYDROmorphone (DILAUDID) injection 1 mg  1 mg Intravenous Q3H PRN Saima Rizwan   1 mg at 06/15/11 0454  . ibuprofen (ADVIL,MOTRIN) tablet 600 mg  600 mg Oral Q6H PRN Mary A. Lynch   600 mg at 06/15/11 0321  . levofloxacin (LEVAQUIN) IVPB 500 mg  500 mg Intravenous Q24H Saima Rizwan   500 mg at 06/14/11 1211  .  LORazepam (ATIVAN) tablet 1 mg  1 mg Oral QHS PRN Saima Rizwan      . LORazepam (ATIVAN) tablet 1 mg  1 mg Oral Q8H PRN Saima Rizwan      . ondansetron (ZOFRAN) tablet 4 mg  4 mg Oral Q6H PRN Saima Rizwan       Or  . ondansetron (ZOFRAN) injection 4 mg  4 mg Intravenous Q6H PRN Saima Rizwan      . potassium chloride SA (K-DUR,KLOR-CON) CR tablet 40 mEq  40 mEq Oral Once Anand Hongalgi   40 mEq at 06/14/11 1409  . PRUTECT emulsion   Topical TID Saima Rizwan      . senna-docusate (Senokot-S) tablet 2 tablet  2 tablet Oral QHS PRN Anand Hongalgi   2 tablet at 06/14/11 2014  . vancomycin (VANCOCIN) IVPB 1000 mg/200 mL premix  1,000 mg Intravenous Q12H Reece Packer, PHARMD   1,000 mg at 06/15/11 0431  . DISCONTD: senna-docusate (Senokot-S) tablet 1 tablet  1 tablet Oral Daily PRN Saima Rizwan        Assessment/Plan  Abscess L upper chest Mastectomy site. Chemo  and Radiation Rx.for breast ca Hbg drop since admission Patient Active Problem List  Diagnoses  . History of breast cancer in female  . Malignant neoplasm of upper-outer quadrant of female breast  . Radiation-induced dermatitis  . Cellulitis  . Hypokalemia  . Nausea & vomiting  . Fever  Plan:  The area is fluctuant, red and tender.  I will make her NPO after MN so we can consider I&D tomorrow.  Will discuss with DR. Ingram.  LOS: 2 days    Sherrie George 06/15/2011

## 2011-06-15 NOTE — Progress Notes (Signed)
  Subjective: Pt complains of pain at abscess  Objective: Vital signs in last 24 hours: Temp:  [98.1 F (36.7 C)-100.1 F (37.8 C)] 98.2 F (36.8 C) (11/19 0843) Pulse Rate:  [79-85] 79  (11/19 0843) Resp:  [18-20] 18  (11/19 0453) BP: (92-102)/(42-68) 102/57 mmHg (11/19 0843) SpO2:  [90 %-98 %] 91 % (11/19 0453) Last BM Date: 06/11/11  Intake/Output from previous day: 11/18 0701 - 11/19 0700 In: 950.8 [I.V.:950.8] Out: -  Intake/Output this shift: Total I/O In: 6360 [P.O.:6360] Out: -   left breast mastectomy with cellulitis and abscess  Lab Results:   Basename 06/15/11 0420 06/13/11 1536  WBC 4.1 6.7  HGB 8.6* 10.4*  HCT 26.4* 30.8*  PLT 174 172   BMET  Basename 06/15/11 0420 06/14/11 0455  NA 139 138  K 3.5 3.4*  CL 108 105  CO2 22 26  GLUCOSE 96 109*  BUN 10 12  CREATININE 0.63 0.76  CALCIUM 8.5 9.1   PT/INR No results found for this basename: LABPROT:2,INR:2 in the last 72 hours ABG No results found for this basename: PHART:2,PCO2:2,PO2:2,HCO3:2 in the last 72 hours  Studies/Results: No results found.  Anti-infectives: Anti-infectives     Start     Dose/Rate Route Frequency Ordered Stop   06/13/11 1600   vancomycin (VANCOCIN) IVPB 1000 mg/200 mL premix        1,000 mg 200 mL/hr over 60 Minutes Intravenous Every 12 hours 06/13/11 1504     06/13/11 1215   levofloxacin (LEVAQUIN) IVPB 500 mg        500 mg 100 mL/hr over 60 Minutes Intravenous Every 24 hours 06/13/11 1214     06/13/11 1200   vancomycin (VANCOCIN) IVPB 1000 mg/200 mL premix        1,000 mg 200 mL/hr over 60 Minutes Intravenous  Once 06/13/11 1111 06/13/11 1253          Assessment/Plan: s/p  Plan surgical drainage in OR tomorrow.  risks discussed with patient in detail.  likelihood of success is good  LOS: 2 days    Jahaira Earnhart A 06/15/2011  

## 2011-06-16 ENCOUNTER — Inpatient Hospital Stay (HOSPITAL_COMMUNITY): Payer: BC Managed Care – PPO

## 2011-06-16 ENCOUNTER — Encounter (HOSPITAL_COMMUNITY): Payer: Self-pay | Admitting: Certified Registered Nurse Anesthetist

## 2011-06-16 ENCOUNTER — Encounter (HOSPITAL_COMMUNITY)
Admission: EM | Disposition: A | Discharge status: Home Health | Payer: Self-pay | Source: Home / Self Care | Attending: Internal Medicine

## 2011-06-16 ENCOUNTER — Other Ambulatory Visit: Payer: Self-pay

## 2011-06-16 ENCOUNTER — Inpatient Hospital Stay (HOSPITAL_COMMUNITY): Payer: BC Managed Care – PPO | Admitting: Certified Registered Nurse Anesthetist

## 2011-06-16 HISTORY — PX: IRRIGATION AND DEBRIDEMENT ABSCESS: SHX5252

## 2011-06-16 LAB — COMPREHENSIVE METABOLIC PANEL
ALT: 84 U/L — ABNORMAL HIGH (ref 0–35)
Alkaline Phosphatase: 108 U/L (ref 39–117)
CO2: 26 mEq/L (ref 19–32)
Chloride: 106 mEq/L (ref 96–112)
GFR calc Af Amer: >90 mL/min (ref >90)
GFR calc non Af Amer: >90 mL/min (ref >90)
Glucose, Bld: 100 mg/dL — ABNORMAL HIGH (ref 70–99)
Potassium: 3.5 mEq/L (ref 3.5–5.1)
Sodium: 141 mEq/L (ref 135–145)
Total Bilirubin: 0.3 mg/dL (ref 0.3–1.2)

## 2011-06-16 LAB — CBC
HCT: 31.1 % — ABNORMAL LOW (ref 36.0–46.0)
Hemoglobin: 10.2 g/dL — ABNORMAL LOW (ref 12.0–15.0)
MCV: 84.5 fL (ref 78.0–100.0)
WBC: 6 10*3/uL (ref 4.0–10.5)

## 2011-06-16 SURGERY — IRRIGATION AND DEBRIDEMENT ABSCESS
Anesthesia: General | Laterality: Left | Wound class: Dirty or Infected

## 2011-06-16 MED ORDER — LACTATED RINGERS IV SOLN: INTRAVENOUS | Status: DC

## 2011-06-16 MED ORDER — KETOROLAC TROMETHAMINE 30 MG/ML IJ SOLN
INTRAMUSCULAR | Status: DC | PRN
  Administered 2011-06-16: 30 mg via INTRAVENOUS

## 2011-06-16 MED ORDER — PROMETHAZINE HCL 25 MG/ML IJ SOLN: 6.2500 mg | INTRAMUSCULAR | Status: DC | PRN

## 2011-06-16 MED ORDER — SODIUM CHLORIDE 0.9 % IR SOLN
Status: DC | PRN
  Administered 2011-06-16 (×2): 1000 mL

## 2011-06-16 MED ORDER — ONDANSETRON HCL 4 MG/2ML IJ SOLN
INTRAMUSCULAR | Status: DC | PRN
  Administered 2011-06-16: 4 mg via INTRAVENOUS

## 2011-06-16 MED ORDER — FENTANYL CITRATE 0.05 MG/ML IJ SOLN
INTRAMUSCULAR | Status: DC | PRN
  Administered 2011-06-16: 50 ug via INTRAVENOUS

## 2011-06-16 MED ORDER — LIDOCAINE HCL (CARDIAC) 20 MG/ML IV SOLN
INTRAVENOUS | Status: DC | PRN
  Administered 2011-06-16: 100 mg via INTRAVENOUS

## 2011-06-16 MED ORDER — LACTATED RINGERS IV SOLN
INTRAVENOUS | Status: DC | PRN
  Administered 2011-06-16: 13:00:00 via INTRAVENOUS

## 2011-06-16 MED ORDER — VANCOMYCIN HCL 1000 MG IV SOLR
1250.0000 mg | Freq: Two times a day (BID) | INTRAVENOUS | Status: DC
  Administered 2011-06-17 – 2011-06-20 (×7): 1250 mg via INTRAVENOUS
  Filled 2011-06-16 (×8): qty 1250

## 2011-06-16 MED ORDER — SODIUM CHLORIDE 0.9 % IV SOLN
INTRAVENOUS | Status: DC | PRN
  Administered 2011-06-16: 12:00:00 via INTRAVENOUS

## 2011-06-16 MED ORDER — LACTATED RINGERS IV SOLN
INTRAVENOUS | Status: DC
  Administered 2011-06-16: 15:00:00 via INTRAVENOUS

## 2011-06-16 MED ORDER — PROMETHAZINE HCL 25 MG/ML IJ SOLN
INTRAMUSCULAR | Status: DC | PRN
  Administered 2011-06-16 (×2): 12.5 mg via INTRAVENOUS

## 2011-06-16 MED ORDER — MIDAZOLAM HCL 5 MG/5ML IJ SOLN
INTRAMUSCULAR | Status: DC | PRN
  Administered 2011-06-16: 1 mg via INTRAVENOUS

## 2011-06-16 MED ORDER — PROMETHAZINE HCL 25 MG/ML IJ SOLN
INTRAMUSCULAR | Status: AC
  Filled 2011-06-16: qty 1

## 2011-06-16 MED ORDER — BUPIVACAINE-EPINEPHRINE (PF) 0.5% -1:200000 IJ SOLN
INTRAMUSCULAR | Status: AC
  Filled 2011-06-16: qty 10

## 2011-06-16 MED ORDER — POTASSIUM CHLORIDE IN NACL 20-0.9 MEQ/L-% IV SOLN
INTRAVENOUS | Status: DC
  Administered 2011-06-16 – 2011-06-19 (×3): via INTRAVENOUS
  Filled 2011-06-16 (×8): qty 1000

## 2011-06-16 MED ORDER — BUPIVACAINE-EPINEPHRINE PF 0.5-1:200000 % IJ SOLN
INTRAMUSCULAR | Status: DC | PRN
  Administered 2011-06-16: 21 mL

## 2011-06-16 MED ORDER — FENTANYL CITRATE 0.05 MG/ML IJ SOLN: 25.0000 ug | INTRAMUSCULAR | Status: DC | PRN

## 2011-06-16 MED ORDER — PROPOFOL 10 MG/ML IV EMUL
INTRAVENOUS | Status: DC | PRN
  Administered 2011-06-16: 200 mg via INTRAVENOUS

## 2011-06-16 SURGICAL SUPPLY — 44 items
ADH SKN CLS APL DERMABOND .7 (GAUZE/BANDAGES/DRESSINGS)
BINDER BREAST LRG (GAUZE/BANDAGES/DRESSINGS) ×1 IMPLANT
BLADE HEX COATED 2.75 (ELECTRODE) ×2 IMPLANT
BLADE SURG SZ10 CARB STEEL (BLADE) ×2 IMPLANT
CANISTER SUCTION 2500CC (MISCELLANEOUS) ×2 IMPLANT
CLOTH BEACON ORANGE TIMEOUT ST (SAFETY) ×2 IMPLANT
DECANTER SPIKE VIAL GLASS SM (MISCELLANEOUS) ×1 IMPLANT
DERMABOND ADVANCED (GAUZE/BANDAGES/DRESSINGS)
DERMABOND ADVANCED .7 DNX12 (GAUZE/BANDAGES/DRESSINGS) IMPLANT
DRAPE LAPAROSCOPIC ABDOMINAL (DRAPES) IMPLANT
DRAPE LAPAROTOMY T 102X78X121 (DRAPES) IMPLANT
DRAPE LAPAROTOMY TRNSV 102X78 (DRAPE) IMPLANT
DRAPE LG THREE QUARTER DISP (DRAPES) IMPLANT
DRSG PAD ABDOMINAL 8X10 ST (GAUZE/BANDAGES/DRESSINGS) ×2 IMPLANT
ELECT REM PT RETURN 9FT ADLT (ELECTROSURGICAL) ×2
ELECTRODE REM PT RTRN 9FT ADLT (ELECTROSURGICAL) ×1 IMPLANT
GAUZE KERLIX 2  STERILE LF (GAUZE/BANDAGES/DRESSINGS) ×1 IMPLANT
GLOVE BIO SURGEON STRL SZ7 (GLOVE) ×6 IMPLANT
GLOVE BIOGEL PI IND STRL 7.0 (GLOVE) ×1 IMPLANT
GLOVE BIOGEL PI IND STRL 7.5 (GLOVE) ×3 IMPLANT
GLOVE BIOGEL PI INDICATOR 7.0 (GLOVE) ×1
GLOVE BIOGEL PI INDICATOR 7.5 (GLOVE) ×3
GOWN PREVENTION PLUS LG XLONG (DISPOSABLE) ×2 IMPLANT
GOWN PREVENTION PLUS XLARGE (GOWN DISPOSABLE) ×2 IMPLANT
GOWN STRL NON-REIN LRG LVL3 (GOWN DISPOSABLE) ×2 IMPLANT
GOWN STRL REIN XL XLG (GOWN DISPOSABLE) ×2 IMPLANT
KIT BASIN OR (CUSTOM PROCEDURE TRAY) ×2 IMPLANT
NDL HYPO 25X1 1.5 SAFETY (NEEDLE) ×1 IMPLANT
NEEDLE HYPO 22GX1.5 SAFETY (NEEDLE) ×1 IMPLANT
NEEDLE HYPO 25X1 1.5 SAFETY (NEEDLE) ×2 IMPLANT
NS IRRIG 1000ML POUR BTL (IV SOLUTION) ×2 IMPLANT
PACK BASIC VI WITH GOWN DISP (CUSTOM PROCEDURE TRAY) ×2 IMPLANT
PEN SKIN MARKING BROAD (MISCELLANEOUS) ×2 IMPLANT
PENCIL BUTTON HOLSTER BLD 10FT (ELECTRODE) ×2 IMPLANT
SPONGE GAUZE 4X4 12PLY (GAUZE/BANDAGES/DRESSINGS) ×2 IMPLANT
SPONGE LAP 18X18 X RAY DECT (DISPOSABLE) IMPLANT
SPONGE LAP 4X18 X RAY DECT (DISPOSABLE) IMPLANT
STAPLER VISISTAT 35W (STAPLE) IMPLANT
SUT MNCRL AB 4-0 PS2 18 (SUTURE) IMPLANT
SUT VIC AB 3-0 SH 18 (SUTURE) IMPLANT
SYR CONTROL 10ML LL (SYRINGE) ×3 IMPLANT
TAPE CLOTH SURG 4X10 WHT LF (GAUZE/BANDAGES/DRESSINGS) ×1 IMPLANT
TOWEL OR 17X26 10 PK STRL BLUE (TOWEL DISPOSABLE) ×2 IMPLANT
YANKAUER SUCT BULB TIP 10FT TU (MISCELLANEOUS) ×2 IMPLANT

## 2011-06-16 NOTE — Interval H&P Note (Signed)
History and Physical Interval Note:   06/16/2011   12:39 PM   Brandi Bates  has presented today for surgery, with the diagnosis of Left chest wall abscess  The various methods of treatment have been discussed with the patient and family. After consideration of risks, benefits and other options for treatment, the patient has consented to  Procedure(s): IRRIGATION AND DEBRIDEMENT ABSCESS as a surgical intervention .  The patients' history has been reviewed, patient examined, no change in status, stable for surgery.  I have reviewed the patients' chart and labs.  Questions were answered to the patient's satisfaction.     Abigail Miyamoto A  MD

## 2011-06-16 NOTE — Anesthesia Preprocedure Evaluation (Addendum)
Anesthesia Evaluation  Patient identified by MRN, date of birth, ID band Patient awake    Reviewed: Allergy & Precautions, H&P , NPO status , Patient's Chart, lab work & pertinent test results, reviewed documented beta blocker date and time   History of Anesthesia Complications (+) PONV  Airway Mallampati: II TM Distance: >3 FB Neck ROM: full    Dental No notable dental hx. (+) Edentulous Upper, Dental Advisory Given and Missing   Pulmonary neg pulmonary ROS,  clear to auscultation  Pulmonary exam normal       Cardiovascular Exercise Tolerance: Good neg cardio ROS regular Normal    Neuro/Psych  Headaches, Negative Neurological ROS  Negative Psych ROS   GI/Hepatic negative GI ROS, Neg liver ROS, GERD-  Medicated,  Endo/Other  Negative Endocrine ROS  Renal/GU negative Renal ROS  Genitourinary negative   Musculoskeletal   Abdominal   Peds  Hematology negative hematology ROS (+)   Anesthesia Other Findings   Reproductive/Obstetrics negative OB ROS                         Anesthesia Physical Anesthesia Plan  ASA: II  Anesthesia Plan: General   Post-op Pain Management:    Induction: Intravenous  Airway Management Planned: LMA  Additional Equipment:   Intra-op Plan:   Post-operative Plan:   Informed Consent: I have reviewed the patients History and Physical, chart, labs and discussed the procedure including the risks, benefits and alternatives for the proposed anesthesia with the patient or authorized representative who has indicated his/her understanding and acceptance.   Dental Advisory Given  Plan Discussed with: CRNA  Anesthesia Plan Comments:        Anesthesia Quick Evaluation

## 2011-06-16 NOTE — Anesthesia Postprocedure Evaluation (Signed)
  Anesthesia Post-op Note  Patient: Brandi Bates  Procedure(s) Performed:  IRRIGATION AND DEBRIDEMENT ABSCESS - incision and drainage of left chest wall abcess  Patient Location: PACU  Anesthesia Type: General  Level of Consciousness: awake and alert   Airway and Oxygen Therapy: Patient Spontanous Breathing  Post-op Pain: mild  Post-op Assessment: Post-op Vital signs reviewed, Patient's Cardiovascular Status Stable, Respiratory Function Stable, Patent Airway and No signs of Nausea or vomiting  Post-op Vital Signs: stable  Complications: No apparent anesthesia complications

## 2011-06-16 NOTE — Progress Notes (Signed)
ANTIBIOTIC CONSULT NOTE - FOLLOW UP  Pharmacy Consult for Vancomycin Indication: Cellulitis/R/O Abscess  Allergies  Allergen Reactions  . Tylox Nausea And Vomiting    Patient Measurements: Height: 5\' 3"  (160 cm) Weight: 170 lb (77.111 kg) IBW/kg (Calculated) : 52.4    Vital Signs: Temp: 98.4 F (36.9 C) (11/20 1526) Temp src: Oral (11/20 1526) BP: 109/53 mmHg (11/20 1526) Pulse Rate: 62  (11/20 1526) Intake/Output from previous day: 11/19 0701 - 11/20 0700 In: 7320 [P.O.:7320] Out: -  Intake/Output from this shift: Total I/O In: 900 [I.V.:900] Out: 25 [Blood:25]  Labs:  [facility] 06/16/11 0340 06/15/11 0420 06/14/11 0455  WBC 6.0 4.1 --  HGB 10.2* 8.6* --  PLT 215 174 --  LABCREA -- -- --  CREATININE 0.59 0.63 0.76   Estimated Creatinine Clearance: 79.1 ml/min (by C-G formula based on Cr of 0.59).  Basename 06/16/11 1518  VANCOTROUGH 8.8*  VANCOPEAK --  Drue Dun --  GENTTROUGH --  GENTPEAK --  GENTRANDOM --  TOBRATROUGH --  TOBRAPEAK --  TOBRARND --  AMIKACINPEAK --  AMIKACINTROU --  AMIKACIN --     Microbiology: Recent Results (from the past 720 hour(s))  CULTURE, ROUTINE-ABSCESS     Status: Normal (Preliminary result)   Collection Time   06/14/11  5:08 PM      Component Value Range Status Comment   Specimen Description ABSCESS LEFT BREAST   Final    Special Requests NONE   Final    Gram Stain     Final    Value: ABUNDANT WBC PRESENT, PREDOMINANTLY PMN     NO SQUAMOUS EPITHELIAL CELLS SEEN     RARE GRAM POSITIVE COCCI     IN PAIRS   Culture     Final    Value: MODERATE STAPHYLOCOCCUS AUREUS     Note: RIFAMPIN AND GENTAMICIN SHOULD NOT BE USED AS SINGLE DRUGS FOR TREATMENT OF STAPH INFECTIONS.   Report Status PENDING   Incomplete     Anti-infectives     Start     Dose/Rate Route Frequency Ordered Stop   06/13/11 1600   vancomycin (VANCOCIN) IVPB 1000 mg/200 mL premix        1,000 mg 200 mL/hr over 60 Minutes Intravenous Every 12 hours  06/13/11 1504     06/13/11 1215   levofloxacin (LEVAQUIN) IVPB 500 mg        500 mg 100 mL/hr over 60 Minutes Intravenous Every 24 hours 06/13/11 1214     06/13/11 1200   vancomycin (VANCOCIN) IVPB 1000 mg/200 mL premix        1,000 mg 200 mL/hr over 60 Minutes Intravenous  Once 06/13/11 1111 06/13/11 1253          Assessment: 55 yo with cellulits/r/o abscess @ left chest radiation site. Trough = 8.8 mcg/ml  Goal of Therapy:  Vancomycin trough level 10-15 mcg/ml  Plan:  Increase Vancomycin to 1250mg  IV q12h. F/U Scr/levels as needed.   Susanne Greenhouse R 06/16/2011,4:26 PM

## 2011-06-16 NOTE — Preoperative (Signed)
Beta Blockers   Reason not to administer Beta Blockers:Not Applicable 

## 2011-06-16 NOTE — Op Note (Signed)
06/13/2011 - 06/16/2011  1:25 PM  PATIENT:  Brandi Bates  55 y.o. female  PRE-OPERATIVE DIAGNOSIS:  Left chest wall abscess  POST-OPERATIVE DIAGNOSIS:  Left chest wall abscess  PROCEDURE:  Procedure(s): IRRIGATION AND DEBRIDEMENT ABSCESS  SURGEON:  Surgeon(s): Shelly Rubenstein, MD  PHYSICIAN ASSISTANT:   ASSISTANTS: none   ANESTHESIA:   local and general  EBL:     BLOOD ADMINISTERED:none  DRAINS: none   LOCAL MEDICATIONS USED:  MARCAINE 21CC  SPECIMEN:  No Specimen  DISPOSITION OF SPECIMEN:  N/A  COUNTS:  YES  TOURNIQUET:  * No tourniquets in log *  DICTATION: .Dragon Dictation The patient was brought to the operating room and identified as the correct patient. She was placed on the operating table and anesthesia was induced. Her left chest was then prepped and draped in usual sterile fashion. Using a scalpel I made an incision over her old scar at the area of maximum fluctuance. A large abscess cavity was entered. All the purulent fluid was drained. I also bluntly remove some of this exudate. Hemostasis was achieved with cautery. I thoroughly irrigated the wound with a large amount of normal saline. I anesthetized the wound Marcaine. I then packed wet-to-dry Kerlix gauze into the wound. Dry gauze placed over top of this. The patient tolerated the procedure well. She was then extubated in the operating room and taken in stable condition to the recovery room. PLAN OF CARE: Admit to inpatient   PATIENT DISPOSITION:  PACU - hemodynamically stable.   Delay start of Pharmacological VTE agent (>24hrs) due to surgical blood loss or risk of bleeding:  {YES/NO/NOT APPLICABLE:20182

## 2011-06-16 NOTE — Transfer of Care (Signed)
Immediate Anesthesia Transfer of Care Note  Patient: Brandi Bates  Procedure(s) Performed:  IRRIGATION AND DEBRIDEMENT ABSCESS - incision and drainage of left chest wall abcess  Patient Location: PACU  Anesthesia Type: General  Level of Consciousness: sedated  Airway & Oxygen Therapy: Patient Spontanous Breathing and Patient connected to face mask oxygen  Post-op Assessment: Report given to PACU RN and Post -op Vital signs reviewed and stable  Post vital signs: Reviewed and stable  Complications: No apparent anesthesia complications

## 2011-06-16 NOTE — H&P (View-Only) (Signed)
  Subjective: Pt complains of pain at abscess  Objective: Vital signs in last 24 hours: Temp:  [98.1 F (36.7 C)-100.1 F (37.8 C)] 98.2 F (36.8 C) (11/19 0843) Pulse Rate:  [79-85] 79  (11/19 0843) Resp:  [18-20] 18  (11/19 0453) BP: (92-102)/(42-68) 102/57 mmHg (11/19 0843) SpO2:  [90 %-98 %] 91 % (11/19 0453) Last BM Date: 06/11/11  Intake/Output from previous day: 11/18 0701 - 11/19 0700 In: 950.8 [I.V.:950.8] Out: -  Intake/Output this shift: Total I/O In: 6360 [P.O.:6360] Out: -   left breast mastectomy with cellulitis and abscess  Lab Results:   [facility] 06/15/11 0420 06/13/11 1536  WBC 4.1 6.7  HGB 8.6* 10.4*  HCT 26.4* 30.8*  PLT 174 172   BMET  Basename 06/15/11 0420 06/14/11 0455  NA 139 138  K 3.5 3.4*  CL 108 105  CO2 22 26  GLUCOSE 96 109*  BUN 10 12  CREATININE 0.63 0.76  CALCIUM 8.5 9.1   PT/INR No results found for this basename: LABPROT:2,INR:2 in the last 72 hours ABG No results found for this basename: PHART:2,PCO2:2,PO2:2,HCO3:2 in the last 72 hours  Studies/Results: No results found.  Anti-infectives: Anti-infectives     Start     Dose/Rate Route Frequency Ordered Stop   06/13/11 1600   vancomycin (VANCOCIN) IVPB 1000 mg/200 mL premix        1,000 mg 200 mL/hr over 60 Minutes Intravenous Every 12 hours 06/13/11 1504     06/13/11 1215   levofloxacin (LEVAQUIN) IVPB 500 mg        500 mg 100 mL/hr over 60 Minutes Intravenous Every 24 hours 06/13/11 1214     06/13/11 1200   vancomycin (VANCOCIN) IVPB 1000 mg/200 mL premix        1,000 mg 200 mL/hr over 60 Minutes Intravenous  Once 06/13/11 1111 06/13/11 1253          Assessment/Plan: s/p  Plan surgical drainage in OR tomorrow.  risks discussed with patient in detail.  likelihood of success is good  LOS: 2 days    Brandi Bates A 06/15/2011

## 2011-06-16 NOTE — Progress Notes (Signed)
Subjective: Nausea since yesterday afternoon. No vomiting. Poor oral intake. Continued pain and swelling in the left armpit area. Continues to have flatus but no BM's.   Review of systems: No history of bleeding, melena.  Objective: Blood pressure 120/62, pulse 96, temperature 98.7 F (37.1 C), temperature source Oral, resp. rate 19, height 5\' 3"  (1.6 m), weight 77.111 kg (170 lb), SpO2 96.00%.  Intake/Output Summary (Last 24 hours) at 06/16/11 1117 Last data filed at 06/15/11 1830  Gross per 24 hour  Intake    960 ml  Output      0 ml  Net    960 ml   General exam: Patient is comfortable and in no distress. Respiratory system: Clear. Cardiovascular system: First and second heart sounds heard, regular. Skin: Swelling underneath the left armpit looks bigger than yesterday, fluctuant with erythema and tenderness. Gastrointestinal system: Abdomen is nondistended, soft and normal bowel sounds heard. Nontender. Central nervous system: Alert and oriented. No focal neurological deficits. Extremities: 5 x 5 power symmetrically.  Lab Results: Basic Metabolic Panel:  Basename 06/16/11 0340 06/15/11 0420  NA 141 139  K 3.5 3.5  CL 106 108  CO2 26 22  GLUCOSE 100* 96  BUN 6 10  CREATININE 0.59 0.63  CALCIUM 9.3 8.5  MG 2.0 1.9  PHOS -- --   Liver Function Tests:  [facility] 06/16/11 0340  AST 42*  ALT 84*  ALKPHOS 108  BILITOT 0.3  PROT 6.1  ALBUMIN 2.6*   No results found for this basename: LIPASE:2,AMYLASE:2 in the last 72 hours No results found for this basename: AMMONIA:2 in the last 72 hours CBC:  Basename 06/16/11 0340 06/15/11 0420  WBC 6.0 4.1  NEUTROABS -- 3.2  HGB 10.2* 8.6*  HCT 31.1* 26.4*  MCV 84.5 86.3  PLT 215 174   Cardiac Enzymes: No results found for this basename: CKTOTAL:3,CKMB:3,CKMBINDEX:3,TROPONINI:3 in the last 72 hours BNP: No results found for this basename: POCBNP:3 in the last 72 hours D-Dimer: No results found for this basename:  DDIMER:2 in the last 72 hours CBG: No results found for this basename: GLUCAP:6 in the last 72 hours Hemoglobin A1C: No results found for this basename: HGBA1C in the last 72 hours Fasting Lipid Panel: No results found for this basename: CHOL,HDL,LDLCALC,TRIG,CHOLHDL,LDLDIRECT in the last 72 hours Thyroid Function Tests: No results found for this basename: TSH,T4TOTAL,FREET4,T3FREE,THYROIDAB in the last 72 hours Anemia Panel: No results found for this basename: VITAMINB12,FOLATE,FERRITIN,TIBC,IRON,RETICCTPCT in the last 72 hours Coagulation: No results found for this basename: LABPROT:2,INR:2 in the last 72 hours Urine Drug Screen:  Alcohol Level: No results found for this basename: ETH:2 in the last 72 hours Urinalysis:  Misc. Labs:   Micro Results: Recent Results (from the past 240 hour(s))  CULTURE, ROUTINE-ABSCESS     Status: Normal (Preliminary result)   Collection Time   06/14/11  5:08 PM      Component Value Range Status Comment   Specimen Description ABSCESS LEFT BREAST   Final    Special Requests NONE   Final    Gram Stain     Final    Value: ABUNDANT WBC PRESENT, PREDOMINANTLY PMN     NO SQUAMOUS EPITHELIAL CELLS SEEN     RARE GRAM POSITIVE COCCI     IN PAIRS   Culture     Final    Value: MODERATE STAPHYLOCOCCUS AUREUS     Note: RIFAMPIN AND GENTAMICIN SHOULD NOT BE USED AS SINGLE DRUGS FOR TREATMENT OF STAPH INFECTIONS.  Report Status PENDING   Incomplete     Studies/Results: No results found.  Medications: Scheduled Meds:    . enoxaparin  40 mg Subcutaneous Q24H  . levofloxacin (LEVAQUIN) IV  500 mg Intravenous Q24H  . PRUTECT   Topical TID  . vancomycin  1,000 mg Intravenous Q12H   Continuous Infusions:    . DISCONTD: sodium chloride 75 mL/hr at 06/15/11 0130   PRN Meds:.acetaminophen, acetaminophen, bisacodyl, diphenhydrAMINE, docusate sodium, HYDROcodone-acetaminophen, HYDROmorphone, ibuprofen, LORazepam, LORazepam, ondansetron (ZOFRAN) IV,  ondansetron, senna-docusate  Assessment/Plan: 1. 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 Growing Staphylococcus aureus on culture. Final report is pending. Continue IV levofloxacin and vancomycin. Scheduled for incision and drainage of the abscess by surgeons this afternoon. 2. Hypokalemia: Repleted 3. Anemia: Stable compared to hemoglobin of yesterday. As per her oncologist, patient's hemoglobin was in the 13 g/dL range on October 24. Some of this may be dilutional. No overt bleeding. Will check stool for occult blood. Will follow CBCs tomorrow. Transfuse if hemoglobin drops to less than 7 g/dL. Patient has not had chemotherapy since August of 2012. 4. Nausea and dry heaves: Treat with antiemetics. This may be secondary to the ongoing pain, pain medications and constipation..  5. Left breast cancer status post mastectomy and chemotherapy and ongoing radiation treatment.   Brandi Bates 06/16/2011, 11:17 AM

## 2011-06-16 NOTE — Anesthesia Procedure Notes (Signed)
Procedure Name: LMA Insertion Date/Time: 06/16/2011 12:59 PM Performed by: Lurlean Leyden, Treydon Henricks L. Patient Re-evaluated:Patient Re-evaluated prior to inductionOxygen Delivery Method: Circle System Utilized Preoxygenation: Pre-oxygenation with 100% oxygen Intubation Type: IV induction LMA: LMA with gastric port inserted LMA Size: 4.0 Number of attempts: 1 Placement Confirmation: breath sounds checked- equal and bilateral and positive ETCO2 Tube secured with: Tape Dental Injury: Teeth and Oropharynx as per pre-operative assessment

## 2011-06-17 ENCOUNTER — Encounter (HOSPITAL_COMMUNITY): Payer: Self-pay | Admitting: Surgery

## 2011-06-17 DIAGNOSIS — N611 Abscess of the breast and nipple: Secondary | ICD-10-CM | POA: Diagnosis present

## 2011-06-17 LAB — COMPREHENSIVE METABOLIC PANEL
AST: 43 U/L — ABNORMAL HIGH (ref 0–37)
Albumin: 2.2 g/dL — ABNORMAL LOW (ref 3.5–5.2)
Calcium: 8.2 mg/dL — ABNORMAL LOW (ref 8.4–10.5)
Creatinine, Ser: 0.69 mg/dL (ref 0.50–1.10)
Total Protein: 5.4 g/dL — ABNORMAL LOW (ref 6.0–8.3)

## 2011-06-17 LAB — CBC
MCH: 27.7 pg (ref 26.0–34.0)
MCV: 86 fL (ref 78.0–100.0)
Platelets: 181 10*3/uL (ref 150–400)
RDW: 15 % (ref 11.5–15.5)

## 2011-06-17 LAB — CULTURE, ROUTINE-ABSCESS

## 2011-06-17 MED ORDER — POTASSIUM CHLORIDE CRYS ER 20 MEQ PO TBCR
40.0000 meq | EXTENDED_RELEASE_TABLET | Freq: Once | ORAL | Status: AC
  Administered 2011-06-17: 40 meq via ORAL
  Filled 2011-06-17: qty 2

## 2011-06-17 NOTE — Progress Notes (Signed)
Subjective: Has pain at op site, sat in chair for a few hours.  Objective:  Vital signs in last 24 hours:  Filed Vitals:   06/16/11 1428 06/16/11 1526 06/16/11 2142 06/17/11 0540  BP: 102/62 109/53 104/70 98/44  Pulse: 62 62 69 78  Temp: 98.3 F (36.8 C) 98.4 F (36.9 C) 98.6 F (37 C) 98.8 F (37.1 C)  TempSrc: Oral Oral Oral Oral  Resp: 14 16 16 18   Height:      Weight:      SpO2: 98% 99% 97% 92%    Intake/Output from previous day:   Intake/Output Summary (Last 24 hours) at 06/17/11 0900 Last data filed at 06/17/11 0500  Gross per 24 hour  Intake   2248 ml  Output     26 ml  Net   2222 ml    Physical Exam: General: Alert, awake, oriented x3, in no acute distress. HEENT: No bruits, no goiter. Heart: Regular rate and rhythm, without murmurs, rubs, gallops. Lungs: Clear to auscultation bilaterally. Abdomen: Soft, nontender, nondistended, positive bowel sounds. Extremities: No clubbing cyanosis or edema with positive pedal pulses. Neuro: Grossly intact, nonfocal.   Lab Results:  Basic Metabolic Panel:    Component Value Date/Time   NA 139 06/17/2011 0415   K 3.4* 06/17/2011 0415   CL 105 06/17/2011 0415   CO2 28 06/17/2011 0415   BUN 10 06/17/2011 0415   CREATININE 0.69 06/17/2011 0415   GLUCOSE 101* 06/17/2011 0415   CALCIUM 8.2* 06/17/2011 0415   CBC:    Component Value Date/Time   WBC 4.1 06/17/2011 0415   WBC 4.1 05/21/2011 0900   HGB 8.5* 06/17/2011 0415   HGB 13.3 05/21/2011 0900   HCT 26.4* 06/17/2011 0415   HCT 38.8 05/21/2011 0900   PLT 181 06/17/2011 0415   PLT 169 05/21/2011 0900   MCV 86.0 06/17/2011 0415   MCV 86.5 05/21/2011 0900   NEUTROABS 3.2 06/15/2011 0420   NEUTROABS 3.3 05/21/2011 0900   LYMPHSABS 0.4* 06/15/2011 0420   LYMPHSABS 0.4* 05/21/2011 0900   MONOABS 0.4 06/15/2011 0420   MONOABS 0.2 05/21/2011 0900   EOSABS 0.1 06/15/2011 0420   EOSABS 0.1 05/21/2011 0900   BASOSABS 0.0 06/15/2011 0420   BASOSABS 0.0  05/21/2011 0900    Recent Results (from the past 240 hour(s))  CULTURE, ROUTINE-ABSCESS     Status: Normal   Collection Time   06/14/11  5:08 PM      Component Value Range Status Comment   Specimen Description ABSCESS LEFT BREAST   Final    Special Requests NONE   Final    Gram Stain     Final    Value: ABUNDANT WBC PRESENT, PREDOMINANTLY PMN     NO SQUAMOUS EPITHELIAL CELLS SEEN     RARE GRAM POSITIVE COCCI     IN PAIRS   Culture     Final    Value: MODERATE STAPHYLOCOCCUS AUREUS     Note: RIFAMPIN AND GENTAMICIN SHOULD NOT BE USED AS SINGLE DRUGS FOR TREATMENT OF STAPH INFECTIONS.   Report Status 06/17/2011 FINAL   Final    Organism ID, Bacteria STAPHYLOCOCCUS AUREUS   Final     Studies/Results: Dg Chest 2 View  06/16/2011  *RADIOLOGY REPORT*  Clinical Data: Preoperative respiratory exam.  History of breast carcinoma.  CHEST - 2 VIEW  Comparison: 10/23/2010  Findings: Port-A-Cath positioning stable with tip at the origin of the SVC.  Bibasilar atelectasis present.  No overt consolidation or significant  pleural effusions. No pulmonary nodules detected. Heart size is normal.  Changes again seen related to left mastectomy and nodal dissection.  IMPRESSION: No active disease.  Bibasilar atelectasis.  Original Report Authenticated By: Reola Calkins, M.D.    Medications: Scheduled Meds:   . enoxaparin  40 mg Subcutaneous Q24H  . levofloxacin (LEVAQUIN) IV  500 mg Intravenous Q24H  . potassium chloride  40 mEq Oral Once  . PRUTECT   Topical TID  . vancomycin  1,250 mg Intravenous Q12H  . DISCONTD: vancomycin  1,000 mg Intravenous Q12H   Continuous Infusions:   . 0.9 % NaCl with KCl 20 mEq / L 75 mL/hr at 06/17/11 0842  . lactated ringers Stopped (06/16/11 1928)  . DISCONTD: lactated ringers 125 mL/hr at 06/16/11 1435   PRN Meds:.acetaminophen, acetaminophen, bisacodyl, diphenhydrAMINE, docusate sodium, fentaNYL, HYDROcodone-acetaminophen, HYDROmorphone, ibuprofen,  LORazepam, LORazepam, ondansetron (ZOFRAN) IV, ondansetron, senna-docusate, DISCONTD: Bupivacaine-Epinephrine PF, DISCONTD: promethazine, DISCONTD: sodium chloride irrigation  Assessment/Plan:  Principal Problem:  *Cellulitis Active Problems:  History of breast cancer in female  Radiation-induced dermatitis  Hypokalemia  Nausea & vomiting  Fever  Abscess of breast  Plan:  Patient is postop day 1 from her incision and drainage of her left breast abscess She does appear to have a fever overnight Currently she is on IV Levaquin and vancomycin Cultures are growing Staphylococcus aureus sensitive to levofloxacin Patient intends to discharge home when she is cleared by general surgery We will switch her over to oral levofloxacin on discharge We'll continue to monitor her hemoglobin and plan to transfuse for hemoglobin less than 7 Potassium will be repleted Will ask physical therapy to work with the patient as she does feel unsteady on her feet while walking   LOS: 4 days   MEMON,JEHANZEB 06/17/2011, 9:00 AM

## 2011-06-17 NOTE — Progress Notes (Signed)
1 Day Post-Op  Subjective: Complains of severe left chest skin pain  Objective: Vital signs in last 24 hours: Temp:  [97.3 F (36.3 C)-98.8 F (37.1 C)] 98.8 F (37.1 C) (11/21 0540) Pulse Rate:  [62-78] 78  (11/21 0540) Resp:  [14-19] 18  (11/21 0540) BP: (98-121)/(44-70) 98/44 mmHg (11/21 0540) SpO2:  [92 %-100 %] 92 % (11/21 0540) Last BM Date: 06/16/11 (very small (pellet like stools) today after enema)  Intake/Output from previous day: 11/20 0701 - 11/21 0700 In: 2248 [P.O.:75; I.V.:2173] Out: 26 [Stool:1; Blood:25] Intake/Output this shift:    Incision/Wound:  Packing removed.  Repacked with wet to dry NS gauze.  Wound clean  Lab Results:   [facility] 06/17/11 0415 06/16/11 0340  WBC 4.1 6.0  HGB 8.5* 10.2*  HCT 26.4* 31.1*  PLT 181 215   BMET  Basename 06/17/11 0415 06/16/11 0340  NA 139 141  K 3.4* 3.5  CL 105 106  CO2 28 26  GLUCOSE 101* 100*  BUN 10 6  CREATININE 0.69 0.59  CALCIUM 8.2* 9.3   PT/INR No results found for this basename: LABPROT:2,INR:2 in the last 72 hours ABG No results found for this basename: PHART:2,PCO2:2,PO2:2,HCO3:2 in the last 72 hours  Studies/Results: Dg Chest 2 View  06/16/2011  *RADIOLOGY REPORT*  Clinical Data: Preoperative respiratory exam.  History of breast carcinoma.  CHEST - 2 VIEW  Comparison: 10/23/2010  Findings: Port-A-Cath positioning stable with tip at the origin of the SVC.  Bibasilar atelectasis present.  No overt consolidation or significant pleural effusions. No pulmonary nodules detected. Heart size is normal.  Changes again seen related to left mastectomy and nodal dissection.  IMPRESSION: No active disease.  Bibasilar atelectasis.  Original Report Authenticated By: Reola Calkins, M.D.    Anti-infectives: Anti-infectives     Start     Dose/Rate Route Frequency Ordered Stop   06/17/11 0200   vancomycin (VANCOCIN) 1,250 mg in sodium chloride 0.9 % 250 mL IVPB        1,250 mg 166.7 mL/hr over 90  Minutes Intravenous Every 12 hours 06/16/11 1642     06/13/11 1600   vancomycin (VANCOCIN) IVPB 1000 mg/200 mL premix  Status:  Discontinued        1,000 mg 200 mL/hr over 60 Minutes Intravenous Every 12 hours 06/13/11 1504 06/16/11 1640   06/13/11 1215   levofloxacin (LEVAQUIN) IVPB 500 mg        500 mg 100 mL/hr over 60 Minutes Intravenous Every 24 hours 06/13/11 1214     06/13/11 1200   vancomycin (VANCOCIN) IVPB 1000 mg/200 mL premix        1,000 mg 200 mL/hr over 60 Minutes Intravenous  Once 06/13/11 1111 06/13/11 1253          Assessment/Plan: s/p Procedure(s): IRRIGATION AND DEBRIDEMENT ABSCESS start BID wet to dry dressing changes  LOS: 4 days    Brandi Bates A 06/17/2011

## 2011-06-18 ENCOUNTER — Other Ambulatory Visit (INDEPENDENT_AMBULATORY_CARE_PROVIDER_SITE_OTHER): Payer: Self-pay | Admitting: Surgery

## 2011-06-18 LAB — BASIC METABOLIC PANEL
BUN: 6 mg/dL (ref 6–23)
CO2: 28 mEq/L (ref 19–32)
Calcium: 9.1 mg/dL (ref 8.4–10.5)
Creatinine, Ser: 0.62 mg/dL (ref 0.50–1.10)
Glucose, Bld: 94 mg/dL (ref 70–99)
Sodium: 140 mEq/L (ref 135–145)

## 2011-06-18 LAB — CBC
HCT: 29.8 % — ABNORMAL LOW (ref 36.0–46.0)
Hemoglobin: 9.4 g/dL — ABNORMAL LOW (ref 12.0–15.0)
MCH: 27.2 pg (ref 26.0–34.0)
MCV: 86.4 fL (ref 78.0–100.0)
RBC: 3.45 MIL/uL — ABNORMAL LOW (ref 3.87–5.11)

## 2011-06-18 MED ORDER — ONDANSETRON 4 MG PO TBDP
4.0000 mg | ORAL_TABLET | Freq: Four times a day (QID) | ORAL | Status: DC | PRN
  Filled 2011-06-18: qty 2

## 2011-06-18 MED ORDER — ALUM & MAG HYDROXIDE-SIMETH 400-400-40 MG/5ML PO SUSP
30.0000 mL | Freq: Four times a day (QID) | ORAL | Status: DC | PRN
  Filled 2011-06-18: qty 30

## 2011-06-18 MED ORDER — DIPHENHYDRAMINE HCL 12.5 MG/5ML PO ELIX: 12.5000 mg | ORAL_SOLUTION | Freq: Four times a day (QID) | ORAL | Status: DC | PRN

## 2011-06-18 MED ORDER — LEVOFLOXACIN 500 MG PO TABS
500.0000 mg | ORAL_TABLET | Freq: Every day | ORAL | Status: DC
  Administered 2011-06-18 – 2011-06-19 (×2): 500 mg via ORAL
  Filled 2011-06-18 (×3): qty 1

## 2011-06-18 MED ORDER — FLORA-Q PO CAPS
1.0000 | ORAL_CAPSULE | Freq: Every day | ORAL | Status: DC
  Administered 2011-06-18 – 2011-06-20 (×3): 1 via ORAL
  Filled 2011-06-18 (×3): qty 1

## 2011-06-18 MED ORDER — HYDROMORPHONE HCL PF 4 MG/ML IJ SOLN
INTRAMUSCULAR | Status: AC
  Administered 2011-06-18: 1 mg
  Filled 2011-06-18: qty 1

## 2011-06-18 MED ORDER — DIPHENHYDRAMINE HCL 50 MG/ML IJ SOLN: 12.5000 mg | Freq: Four times a day (QID) | INTRAMUSCULAR | Status: DC | PRN

## 2011-06-18 MED ORDER — SORBITOL 70 % SOLN
TOPICAL_OIL | Freq: Once | ORAL | Status: DC
  Filled 2011-06-18: qty 240

## 2011-06-18 MED ORDER — POLYETHYLENE GLYCOL 3350 17 G PO PACK
17.0000 g | PACK | Freq: Two times a day (BID) | ORAL | Status: DC
  Administered 2011-06-18: 13:00:00 via ORAL
  Administered 2011-06-19 – 2011-06-20 (×3): 17 g via ORAL
  Filled 2011-06-18 (×7): qty 1

## 2011-06-18 MED ORDER — HYDROMORPHONE BOLUS VIA INFUSION
0.5000 mg | INTRAVENOUS | Status: DC | PRN
  Filled 2011-06-18: qty 200

## 2011-06-18 MED ORDER — DIPHENHYDRAMINE HCL 25 MG PO CAPS: 25.0000 mg | ORAL_CAPSULE | Freq: Four times a day (QID) | ORAL | Status: DC | PRN

## 2011-06-18 MED ORDER — ZOLPIDEM TARTRATE 5 MG PO TABS: 5.0000 mg | ORAL_TABLET | Freq: Every evening | ORAL | Status: DC | PRN

## 2011-06-18 MED ORDER — SODIUM CHLORIDE 0.9 % IJ SOLN: 9.0000 mL | INTRAMUSCULAR | Status: DC | PRN

## 2011-06-18 MED ORDER — NAPROXEN 500 MG PO TABS
500.0000 mg | ORAL_TABLET | Freq: Two times a day (BID) | ORAL | Status: DC
  Administered 2011-06-18 – 2011-06-20 (×4): 500 mg via ORAL
  Filled 2011-06-18 (×5): qty 1

## 2011-06-18 MED ORDER — NALOXONE HCL 0.4 MG/ML IJ SOLN: 0.4000 mg | INTRAMUSCULAR | Status: DC | PRN

## 2011-06-18 MED ORDER — PSYLLIUM 95 % PO PACK: 1.0000 | PACK | Freq: Two times a day (BID) | ORAL | Status: DC

## 2011-06-18 MED ORDER — MORPHINE SULFATE (PF) 1 MG/ML IV SOLN
INTRAVENOUS | Status: DC
  Filled 2011-06-18: qty 25

## 2011-06-18 MED ORDER — ONDANSETRON HCL 4 MG/2ML IJ SOLN: 4.0000 mg | Freq: Four times a day (QID) | INTRAMUSCULAR | Status: DC | PRN

## 2011-06-18 MED ORDER — HYDROMORPHONE 0.3 MG/ML IV SOLN
INTRAVENOUS | Status: DC
  Administered 2011-06-18: 0.599 mg via INTRAVENOUS
  Administered 2011-06-18: 7.5 mg via INTRAVENOUS
  Administered 2011-06-19: 0.599 mg via INTRAVENOUS
  Administered 2011-06-19: 1.19 mg via INTRAVENOUS
  Administered 2011-06-19: 0.4 mg via INTRAVENOUS
  Administered 2011-06-19: 0.999 mg via INTRAVENOUS
  Administered 2011-06-19: 0.399 mg via INTRAVENOUS
  Filled 2011-06-18: qty 25

## 2011-06-18 MED ORDER — HYDROCORTISONE 1 % EX CREA
1.0000 "application " | TOPICAL_CREAM | Freq: Four times a day (QID) | CUTANEOUS | Status: DC | PRN
  Filled 2011-06-18: qty 28

## 2011-06-18 MED ORDER — HYDROMORPHONE HCL PF 1 MG/ML IJ SOLN
1.0000 mg | Freq: Once | INTRAMUSCULAR | Status: AC
  Administered 2011-06-18: 1 mg via INTRAVENOUS

## 2011-06-18 MED ORDER — WITCH HAZEL-GLYCERIN EX PADS: 1.0000 "application " | MEDICATED_PAD | CUTANEOUS | Status: DC | PRN

## 2011-06-18 MED ORDER — LIP MEDEX EX OINT
1.0000 "application " | TOPICAL_OINTMENT | Freq: Two times a day (BID) | CUTANEOUS | Status: DC
  Administered 2011-06-18 – 2011-06-20 (×5): 1 via TOPICAL
  Filled 2011-06-18: qty 7

## 2011-06-18 MED ORDER — MAGNESIUM HYDROXIDE 400 MG/5ML PO SUSP: 30.0000 mL | Freq: Three times a day (TID) | ORAL | Status: DC | PRN

## 2011-06-18 MED ORDER — HYDROMORPHONE HCL 2 MG PO TABS
2.0000 mg | ORAL_TABLET | ORAL | Status: DC | PRN
  Administered 2011-06-18: 2 mg via ORAL
  Filled 2011-06-18: qty 1

## 2011-06-18 MED ORDER — HYDROCERIN EX CREA
1.0000 "application " | TOPICAL_CREAM | Freq: Two times a day (BID) | CUTANEOUS | Status: DC
  Administered 2011-06-18 – 2011-06-20 (×5): 1 via TOPICAL
  Filled 2011-06-18: qty 113

## 2011-06-18 MED ORDER — TRAMADOL HCL 50 MG PO TABS: 50.0000 mg | ORAL_TABLET | Freq: Four times a day (QID) | ORAL | Status: DC | PRN

## 2011-06-18 NOTE — Progress Notes (Signed)
Subjective: No events overnight. Having pain at incision site, also complaining of constipation  Objective:  Vital signs in last 24 hours:  Filed Vitals:   06/17/11 1310 06/17/11 2122 06/18/11 0548 06/18/11 1346  BP: 128/80 127/70 115/64 129/60  Pulse: 82 70 79 74  Temp: 99.4 F (37.4 C) 98.6 F (37 C) 98.5 F (36.9 C) 98.1 F (36.7 C)  TempSrc: Oral Oral Oral Oral  Resp: 20 18 18 18   Height:      Weight:      SpO2: 93% 98% 97% 93%    Intake/Output from previous day:   Intake/Output Summary (Last 24 hours) at 06/18/11 1900 Last data filed at 06/18/11 1349  Gross per 24 hour  Intake   1490 ml  Output      4 ml  Net   1486 ml    Physical Exam: General: Alert, awake, oriented x3, in no acute distress. HEENT: No bruits, no goiter. Moist mucous membranes, no scleral icterus, no conjunctival pallor. Heart: Regular rate and rhythm, without murmurs, rubs, gallops. Lungs: Clear to auscultation bilaterally. No wheezing, no rhonchi, no rales.  Abdomen: Soft, nontender, nondistended, positive bowel sounds. Extremities: No clubbing cyanosis or edema,  positive pedal pulses. Neuro: Grossly intact, nonfocal.    Lab Results:  Basic Metabolic Panel:    Component Value Date/Time   NA 140 06/18/2011 0502   K 3.5 06/18/2011 0502   CL 106 06/18/2011 0502   CO2 28 06/18/2011 0502   BUN 6 06/18/2011 0502   CREATININE 0.62 06/18/2011 0502   GLUCOSE 94 06/18/2011 0502   CALCIUM 9.1 06/18/2011 0502   CBC:    Component Value Date/Time   WBC 3.1* 06/18/2011 0502   WBC 4.1 05/21/2011 0900   HGB 9.4* 06/18/2011 0502   HGB 13.3 05/21/2011 0900   HCT 29.8* 06/18/2011 0502   HCT 38.8 05/21/2011 0900   PLT 219 06/18/2011 0502   PLT 169 05/21/2011 0900   MCV 86.4 06/18/2011 0502   MCV 86.5 05/21/2011 0900   NEUTROABS 3.2 06/15/2011 0420   NEUTROABS 3.3 05/21/2011 0900   LYMPHSABS 0.4* 06/15/2011 0420   LYMPHSABS 0.4* 05/21/2011 0900   MONOABS 0.4 06/15/2011 0420   MONOABS  0.2 05/21/2011 0900   EOSABS 0.1 06/15/2011 0420   EOSABS 0.1 05/21/2011 0900   BASOSABS 0.0 06/15/2011 0420   BASOSABS 0.0 05/21/2011 0900      Lab 06/18/11 0502 06/17/11 0415 06/16/11 0340 06/15/11 0420 06/13/11 1536 06/13/11 0945  WBC 3.1* 4.1 6.0 4.1 6.7 --  HGB 9.4* 8.5* 10.2* 8.6* 10.4* --  HCT 29.8* 26.4* 31.1* 26.4* 30.8* --  PLT 219 181 215 174 172 --  MCV 86.4 86.0 84.5 86.3 84.6 --  MCH 27.2 27.7 27.7 28.1 28.6 --  MCHC 31.5 32.2 32.8 32.6 33.8 --  RDW 15.0 15.0 14.6 15.2 15.0 --  LYMPHSABS -- -- -- 0.4* -- 0.8  MONOABS -- -- -- 0.4 -- 0.5  EOSABS -- -- -- 0.1 -- 0.0  BASOSABS -- -- -- 0.0 -- 0.0  BANDABS -- -- -- -- -- --    Lab 06/18/11 0502 06/17/11 0415 06/16/11 0340 06/15/11 0420 06/14/11 0455  NA 140 139 141 139 138  K 3.5 3.4* 3.5 3.5 3.4*  CL 106 105 106 108 105  CO2 28 28 26 22 26   GLUCOSE 94 101* 100* 96 109*  BUN 6 10 6 10 12   CREATININE 0.62 0.69 0.59 0.63 0.76  CALCIUM 9.1 8.2* 9.3 8.5 9.1  MG 2.1 -- 2.0 1.9 --   No results found for this basename: INR:5,PROTIME:5 in the last 168 hours Cardiac markers: No results found for this basename: CK:3,CKMB:3,TROPONINI:3,MYOGLOBIN:3 in the last 168 hours No results found for this basename: POCBNP:3 in the last 168 hours Recent Results (from the past 240 hour(s))  CULTURE, ROUTINE-ABSCESS     Status: Normal   Collection Time   06/14/11  5:08 PM      Component Value Range Status Comment   Specimen Description ABSCESS LEFT BREAST   Final    Special Requests NONE   Final    Gram Stain     Final    Value: ABUNDANT WBC PRESENT, PREDOMINANTLY PMN     NO SQUAMOUS EPITHELIAL CELLS SEEN     RARE GRAM POSITIVE COCCI     IN PAIRS   Culture     Final    Value: MODERATE STAPHYLOCOCCUS AUREUS     Note: RIFAMPIN AND GENTAMICIN SHOULD NOT BE USED AS SINGLE DRUGS FOR TREATMENT OF STAPH INFECTIONS.   Report Status 06/17/2011 FINAL   Final    Organism ID, Bacteria STAPHYLOCOCCUS AUREUS   Final      Studies/Results: No results found.  Medications: Scheduled Meds:   . enoxaparin  40 mg Subcutaneous Q24H  . Flora-Q  1 capsule Oral Daily  . hydrocerin  1 application Topical BID  .  HYDROmorphone (DILAUDID) injection  1 mg Intravenous Once  . HYDROmorphone PCA 0.3 mg/mL   Intravenous Q4H  . levofloxacin  500 mg Oral Daily  . lip balm  1 application Topical BID  . naproxen  500 mg Oral BID WC  . polyethylene glycol  17 g Oral BID  . PRUTECT   Topical TID  . sorbitol, milk of mag, mineral oil, glycerin (SMOG) enema   Rectal Once  . vancomycin  1,250 mg Intravenous Q12H  . DISCONTD: HYDROmorphone      . DISCONTD: levofloxacin (LEVAQUIN) IV  500 mg Intravenous Q24H  . DISCONTD: morphine   Intravenous Q4H  . DISCONTD: psyllium  1 packet Oral BID   Continuous Infusions:   . 0.9 % NaCl with KCl 20 mEq / L 75 mL/hr at 06/17/11 1330  . lactated ringers Stopped (06/16/11 1928)   PRN Meds:.acetaminophen, acetaminophen, alum & mag hydroxide-simeth, bisacodyl, diphenhydrAMINE, diphenhydrAMINE, diphenhydrAMINE, diphenhydrAMINE, hydrocortisone cream, HYDROmorphone, LORazepam, LORazepam, magnesium hydroxide, naloxone, ondansetron (ZOFRAN) IV, ondansetron (ZOFRAN) IV, ondansetron, ondansetron, senna-docusate, sodium chloride, traMADol, witch hazel-glycerin, zolpidem, DISCONTD: docusate sodium, DISCONTD: fentaNYL DISCONTD: HYDROcodone-acetaminophen, DISCONTD: HYDROmorphone, DISCONTD: HYDROmorphone, DISCONTD: ibuprofen, DISCONTD: naloxone, DISCONTD: sodium chloride  Assessment/Plan:  Principal Problem:  *Abscess of breast Active Problems:  History of breast cancer in female  Radiation-induced dermatitis  Cellulitis  Hypokalemia  Nausea & vomiting  Fever  Plan:  The patient has not had a fever overnight. She is continued on antibiotics with levofloxacin. Her cultures have grown positive for staph aureus which is sensitive to levofloxacin. Continued on dressing changes per general  surgery. Plan is to eventually discharged home with home health nursing. Once she is cleared by surgery we can plan on discharge home. The patient has been placed on a bowel regimen for her constipation.   LOS: 5 days   MEMON,JEHANZEB 06/18/2011, 7:00 PM

## 2011-06-18 NOTE — Progress Notes (Signed)
Patient is complaining that the binder is too tight and smashes her good breast and sternum to the point of dyspnea/pain.  Attempted to loosen the binder by refastening with only a portion of the velcro adhering.  Patient is still wondering whether it is possible to get a looser binder.  Patient is also still bothered by constipation.  Says she feels "plumb stopped up."  A small bowel movement happened this a.m. Which she described as "five small balls."  Question need to add something daily for bowel prophylaxis?  Thank you, Roney Mans. RN, BSN 06/18/2011 10:27 AM

## 2011-06-18 NOTE — Progress Notes (Signed)
PHARMACIST - PHYSICIAN COMMUNICATION DR:   Magnus Ivan, [facility] CONCERNING: Antibiotic IV to Oral Route Change Policy  RECOMMENDATION: This patient is receiving Levaquin by the intravenous route.  Based on criteria approved by the Pharmacy and Therapeutics Committee, the antibiotic(s) is/are being converted to the equivalent oral dose form(s).   DESCRIPTION: These criteria include:  Patient being treated for a respiratory tract infection, urinary tract infection, or cellulitis  The patient is not neutropenic and does not exhibit a GI malabsorption state  The patient is eating (either orally or via tube) and/or has been taking other orally administered medications for a least 24 hours  The patient is improving clinically and has a Tmax < 100.5  If you have questions about this conversion, please contact the Pharmacy Department  []   [phone] )  [facility]king []   [phone] )  [facility]  []   [phone] )  [facility] [x]   [phone] )  [facility]

## 2011-06-18 NOTE — Progress Notes (Signed)
PCP: [name], MD  Outpatient Care Team: Patient Care Team: [name] as PCP - General (Family Medicine) Shelly Rubenstein, MD as Consulting Physician (General Surgery)  Inpatient Treatment Team: Treatment Team: Attending Provider: Erick Blinks; Technician: Arvid Right, NT; Rounding Team: Alton Revere; Technician: Lemar Livings, NT; Registered Nurse: Janine Limbo, RN; Respiratory Therapist: Jaynie Crumble, RRT; Consulting Physician: Shelly Rubenstein, MD; Registered Nurse: Era Bumpers   LOS: 5 days   2 Days Post-Op  Procedure(s): IRRIGATION AND DEBRIDEMENT ABSCESS  Subjective:  Sore Constipated - wants to try Miralax Nauseated - mild w pain - better now Walking in hallways C/o binder tight around chest wall  Objective:  Vital signs:  Temp:  [98.5 F (36.9 C)-99.4 F (37.4 C)] 98.5 F (36.9 C) (11/22 0548) Pulse Rate:  [70-82] 79  (11/22 0548) Resp:  [18-20] 18  (11/22 0548) BP: (115-128)/(64-80) 115/64 mmHg (11/22 0548) SpO2:  [93 %-98 %] 97 % (11/22 0548) Last BM Date: 06/15/11  Intake/Output    from previous day: 11/21 0701 - 11/22 0700 In: 2095 [I.V.:2095] Out: -   this shift:    Flatus: yes BM: small few stool balls  Physical Exam:  General: Pt awake/alert/oriented x4 in mild discomfort Eyes: PERRL, normal EOM.  Sclera clear.  No icterus Neuro: CN II-XII intact w/o focal sensory/motor deficits. Lymph: No head/neck/groin lymphadenopathy Psych:  No delerium/psychosis/paranoia HENT: Normocephalic, Mucus membranes moist.  No thrush Neck: Supple, No tracheal deviation Chest: Left chest wall chronic skin & firmness c/w post radiation changes.  Good excursion.  Binder too small - very tight on chest wall CV:  Pulses intact.  Regular rhythm Abdomen: Soft, Nontender/Nondistended.  No incarcerated hernias. Ext:  SCDs BLE.  No mjr edema.  No cyanosis Skin: No petechiae / purpurae  Results:   Labs: Results for  orders placed during the hospital encounter of 06/13/11 (from the past 48 hour(s))  VANCOMYCIN, TROUGH     Status: Abnormal   Collection Time   06/16/11  3:18 PM      Component Value Range Comment   Vancomycin Tr 8.8 (*) 10.0 - 20.0 (ug/mL)   CBC     Status: Abnormal   Collection Time   06/17/11  4:15 AM      Component Value Range Comment   WBC 4.1  4.0 - 10.5 (K/uL)    RBC 3.07 (*) 3.87 - 5.11 (MIL/uL)    Hemoglobin 8.5 (*) 12.0 - 15.0 (g/dL)    HCT 91.4 (*) 78.2 - 46.0 (%)    MCV 86.0  78.0 - 100.0 (fL)    MCH 27.7  26.0 - 34.0 (pg)    MCHC 32.2  30.0 - 36.0 (g/dL)    RDW 95.6  21.3 - 08.6 (%)    Platelets 181  150 - 400 (K/uL)   COMPREHENSIVE METABOLIC PANEL     Status: Abnormal   Collection Time   06/17/11  4:15 AM      Component Value Range Comment   Sodium 139  135 - 145 (mEq/L)    Potassium 3.4 (*) 3.5 - 5.1 (mEq/L)    Chloride 105  96 - 112 (mEq/L)    CO2 28  19 - 32 (mEq/L)    Glucose, Bld 101 (*) 70 - 99 (mg/dL)    BUN 10  6 - 23 (mg/dL)    Creatinine, Ser 5.78  0.50 - 1.10 (mg/dL)    Calcium 8.2 (*) 8.4 - 10.5 (mg/dL)    Total  Protein 5.4 (*) 6.0 - 8.3 (g/dL)    Albumin 2.2 (*) 3.5 - 5.2 (g/dL)    AST 43 (*) 0 - 37 (U/L)    ALT 69 (*) 0 - 35 (U/L)    Alkaline Phosphatase 92  39 - 117 (U/L)    Total Bilirubin 0.2 (*) 0.3 - 1.2 (mg/dL)    GFR calc non Af Amer >90  >90 (mL/min)    GFR calc Af Amer >90  >90 (mL/min)   CBC     Status: Abnormal   Collection Time   06/18/11  5:02 AM      Component Value Range Comment   WBC 3.1 (*) 4.0 - 10.5 (K/uL)    RBC 3.45 (*) 3.87 - 5.11 (MIL/uL)    Hemoglobin 9.4 (*) 12.0 - 15.0 (g/dL)    HCT 16.1 (*) 09.6 - 46.0 (%)    MCV 86.4  78.0 - 100.0 (fL)    MCH 27.2  26.0 - 34.0 (pg)    MCHC 31.5  30.0 - 36.0 (g/dL)    RDW 04.5  40.9 - 81.1 (%)    Platelets 219  150 - 400 (K/uL)   BASIC METABOLIC PANEL     Status: Normal   Collection Time   06/18/11  5:02 AM      Component Value Range Comment   Sodium 140  135 - 145 (mEq/L)     Potassium 3.5  3.5 - 5.1 (mEq/L)    Chloride 106  96 - 112 (mEq/L)    CO2 28  19 - 32 (mEq/L)    Glucose, Bld 94  70 - 99 (mg/dL)    BUN 6  6 - 23 (mg/dL)    Creatinine, Ser 9.14  0.50 - 1.10 (mg/dL)    Calcium 9.1  8.4 - 10.5 (mg/dL)    GFR calc non Af Amer >90  >90 (mL/min)    GFR calc Af Amer >90  >90 (mL/min)   MAGNESIUM     Status: Normal   Collection Time   06/18/11  5:02 AM      Component Value Range Comment   Magnesium 2.1  1.5 - 2.5 (mg/dL)     Imaging / Studies: @[id number] @  Antibiotics: Anti-infectives     Start     Dose/Rate Route Frequency Ordered Stop   06/18/11 1200   levofloxacin (LEVAQUIN) tablet 500 mg        500 mg Oral Daily 06/18/11 0952     06/17/11 0200   vancomycin (VANCOCIN) 1,250 mg in sodium chloride 0.9 % 250 mL IVPB        1,250 mg 166.7 mL/hr over 90 Minutes Intravenous Every 12 hours 06/16/11 1642     06/13/11 1600   vancomycin (VANCOCIN) IVPB 1000 mg/200 mL premix  Status:  Discontinued        1,000 mg 200 mL/hr over 60 Minutes Intravenous Every 12 hours 06/13/11 1504 06/16/11 1640   06/13/11 1215   levofloxacin (LEVAQUIN) IVPB 500 mg  Status:  Discontinued        500 mg 100 mL/hr over 60 Minutes Intravenous Every 24 hours 06/13/11 1214 06/18/11 0952   06/13/11 1200   vancomycin (VANCOCIN) IVPB 1000 mg/200 mL premix        1,000 mg 200 mL/hr over 60 Minutes Intravenous  Once 06/13/11 1111 06/13/11 1253          Medications / Allergies: per chart  Assessment / Plan: Brandi Bates  55 y.o. female  Procedure(s): IRRIGATION  AND DEBRIDEMENT ABSCESS  Problem List:  Principal Problem:  *Abscess of breast Active Problems:  History of breast cancer in female  Radiation-induced dermatitis  Cellulitis  Hypokalemia  Nausea & vomiting  Fever  -Dressing changes -increase pain control -enema & miralax bowel regimen for constipation -larger binder -VTE prophylaxis- SCDs, etc -mobilize as tolerated to help recovery  Lorenso Courier, M.D., F.A.C.S. Gastrointestinal and Minimally Invasive Surgery [facility] [address], [address] [place], [place] [address] [phone] Main / Paging [phone] Voice Mail   06/18/2011

## 2011-06-18 NOTE — Progress Notes (Signed)
Patient was surprised  is questioning the use of K-pad to her wound site, based on radiation oncology's recommendation that heat and ice be avoided.  She would like to discuss this with [facility] MD and/or oncology services before using heat or ice.    Also, please note that patient refused a morphine PCA that was initially suggested for her when the IV Dilaudid bolus order was unworkable, due to a stated case of hives in 1999.  Question need to add morphine to her list of allergies.  Thank you, Roney Mans. RN, BSN 06/18/2011 7:52 PM

## 2011-06-19 MED ORDER — OXYCODONE-ACETAMINOPHEN 5-325 MG PO TABS
2.0000 | ORAL_TABLET | ORAL | Status: DC | PRN
  Administered 2011-06-19: 2 via ORAL
  Filled 2011-06-19: qty 2

## 2011-06-19 MED ORDER — HYDROMORPHONE HCL PF 1 MG/ML IJ SOLN
1.0000 mg | INTRAMUSCULAR | Status: DC | PRN
  Administered 2011-06-20: 1 mg via INTRAVENOUS
  Filled 2011-06-19: qty 1

## 2011-06-19 NOTE — Progress Notes (Signed)
Cont IV ABx, possible transition to oral Abx and Home health soon Just switching to larger binder - hopefully will provide less trauma to skin and allow area to clean up better F/u cultures

## 2011-06-19 NOTE — Progress Notes (Signed)
06-19-11 Spoke with patient at bedside to discuss dc plans. Pt remains on IV Dilaudid PCA currently. Chose [facility] for RN for dressing changes when she is discharged. Will need [facility] orders for RN and changes as well. Pt lives with husband. States her oncologist is Dr. Arlice Colt. Called Kristen with [facility] to make aware of referral. Sticky note left for orders. [facility] number given to patient for PCP because she states she does not have one currently. Surgery following as well. Raiford Noble, RN, BSN. [phone].

## 2011-06-19 NOTE — Progress Notes (Signed)
Subjective: No events overnight. she is having bowel movements. Trying not to use pain medicine Objective:  Vital signs in last 24 hours:  Filed Vitals:   06/19/11 0911 06/19/11 1321 06/19/11 1410 06/19/11 1526  BP:   104/64   Pulse:   74   Temp:   97.7 F (36.5 C)   TempSrc:   Oral   Resp: 16 16 18 20   Height:      Weight:      SpO2: 93% 94% 97% 94%    Intake/Output from previous day:   Intake/Output Summary (Last 24 hours) at 06/19/11 1831 Last data filed at 06/19/11 1526  Gross per 24 hour  Intake    450 ml  Output      2 ml  Net    448 ml    Physical Exam: General: Alert, awake, oriented x3, in no acute distress. HEENT: No bruits, no goiter. Moist mucous membranes, no scleral icterus, no conjunctival pallor. Heart: Regular rate and rhythm, without murmurs, rubs, gallops. Lungs: Clear to auscultation bilaterally. No wheezing, no rhonchi, no rales.  Abdomen: Soft, nontender, nondistended, positive bowel sounds. Extremities: No clubbing cyanosis or edema,  positive pedal pulses. Neuro: Grossly intact, nonfocal.    Lab Results:  Basic Metabolic Panel:    Component Value Date/Time   NA 140 06/18/2011 0502   K 3.5 06/18/2011 0502   CL 106 06/18/2011 0502   CO2 28 06/18/2011 0502   BUN 6 06/18/2011 0502   CREATININE 0.62 06/18/2011 0502   GLUCOSE 94 06/18/2011 0502   CALCIUM 9.1 06/18/2011 0502   CBC:    Component Value Date/Time   WBC 3.1* 06/18/2011 0502   WBC 4.1 05/21/2011 0900   HGB 9.4* 06/18/2011 0502   HGB 13.3 05/21/2011 0900   HCT 29.8* 06/18/2011 0502   HCT 38.8 05/21/2011 0900   PLT 219 06/18/2011 0502   PLT 169 05/21/2011 0900   MCV 86.4 06/18/2011 0502   MCV 86.5 05/21/2011 0900   NEUTROABS 3.2 06/15/2011 0420   NEUTROABS 3.3 05/21/2011 0900   LYMPHSABS 0.4* 06/15/2011 0420   LYMPHSABS 0.4* 05/21/2011 0900   MONOABS 0.4 06/15/2011 0420   MONOABS 0.2 05/21/2011 0900   EOSABS 0.1 06/15/2011 0420   EOSABS 0.1 05/21/2011 0900   BASOSABS 0.0 06/15/2011 0420   BASOSABS 0.0 05/21/2011 0900      Lab 06/18/11 0502 06/17/11 0415 06/16/11 0340 06/15/11 0420 06/13/11 1536 06/13/11 0945  WBC 3.1* 4.1 6.0 4.1 6.7 --  HGB 9.4* 8.5* 10.2* 8.6* 10.4* --  HCT 29.8* 26.4* 31.1* 26.4* 30.8* --  PLT 219 181 215 174 172 --  MCV 86.4 86.0 84.5 86.3 84.6 --  MCH 27.2 27.7 27.7 28.1 28.6 --  MCHC 31.5 32.2 32.8 32.6 33.8 --  RDW 15.0 15.0 14.6 15.2 15.0 --  LYMPHSABS -- -- -- 0.4* -- 0.8  MONOABS -- -- -- 0.4 -- 0.5  EOSABS -- -- -- 0.1 -- 0.0  BASOSABS -- -- -- 0.0 -- 0.0  BANDABS -- -- -- -- -- --    Lab 06/18/11 0502 06/17/11 0415 06/16/11 0340 06/15/11 0420 06/14/11 0455  NA 140 139 141 139 138  K 3.5 3.4* 3.5 3.5 3.4*  CL 106 105 106 108 105  CO2 28 28 26 22 26   GLUCOSE 94 101* 100* 96 109*  BUN 6 10 6 10 12   CREATININE 0.62 0.69 0.59 0.63 0.76  CALCIUM 9.1 8.2* 9.3 8.5 9.1  MG 2.1 -- 2.0 1.9 --  No results found for this basename: INR:5,PROTIME:5 in the last 168 hours Cardiac markers: No results found for this basename: CK:3,CKMB:3,TROPONINI:3,MYOGLOBIN:3 in the last 168 hours No results found for this basename: POCBNP:3 in the last 168 hours Recent Results (from the past 240 hour(s))  CULTURE, ROUTINE-ABSCESS     Status: Normal   Collection Time   06/14/11  5:08 PM      Component Value Range Status Comment   Specimen Description ABSCESS LEFT BREAST   Final    Special Requests NONE   Final    Gram Stain     Final    Value: ABUNDANT WBC PRESENT, PREDOMINANTLY PMN     NO SQUAMOUS EPITHELIAL CELLS SEEN     RARE GRAM POSITIVE COCCI     IN PAIRS   Culture     Final    Value: MODERATE STAPHYLOCOCCUS AUREUS     Note: RIFAMPIN AND GENTAMICIN SHOULD NOT BE USED AS SINGLE DRUGS FOR TREATMENT OF STAPH INFECTIONS.   Report Status 06/17/2011 FINAL   Final    Organism ID, Bacteria STAPHYLOCOCCUS AUREUS   Final     Studies/Results: No results found.  Medications: Scheduled Meds:   . enoxaparin  40 mg  Subcutaneous Q24H  . Flora-Q  1 capsule Oral Daily  . hydrocerin  1 application Topical BID  . HYDROmorphone PCA 0.3 mg/mL   Intravenous Q4H  . levofloxacin  500 mg Oral Daily  . lip balm  1 application Topical BID  . naproxen  500 mg Oral BID WC  . polyethylene glycol  17 g Oral BID  . PRUTECT   Topical TID  . sorbitol, milk of mag, mineral oil, glycerin (SMOG) enema   Rectal Once  . vancomycin  1,250 mg Intravenous Q12H   Continuous Infusions:   . 0.9 % NaCl with KCl 20 mEq / L 75 mL/hr at 06/19/11 0909  . lactated ringers Stopped (06/16/11 1928)   PRN Meds:.acetaminophen, acetaminophen, alum & mag hydroxide-simeth, bisacodyl, diphenhydrAMINE, diphenhydrAMINE, diphenhydrAMINE, diphenhydrAMINE, hydrocortisone cream, HYDROmorphone, LORazepam, LORazepam, magnesium hydroxide, naloxone, ondansetron (ZOFRAN) IV, ondansetron (ZOFRAN) IV, ondansetron, ondansetron, senna-docusate, sodium chloride, traMADol, witch hazel-glycerin, zolpidem  Assessment/Plan:  Principal Problem:  *Abscess of breast Active Problems:  History of breast cancer in female  Radiation-induced dermatitis  Cellulitis  Hypokalemia  Nausea & vomiting  Fever  Plan:  Patient is continued on IV antibiotics and dressing changes. She is agreeable to come off the Dilaudid PCA. We will start oral pain medication and PRN Dilaudid for breakthrough. She is continued on IV antibiotics with possible change to by mouth antibiotics tomorrow. She's been set up with home health therapy. Discharge home when cleared by general surgery   LOS: 6 days   Brandi Bates 06/19/2011, 6:31 PM

## 2011-06-19 NOTE — Progress Notes (Signed)
Physical Therapy Evaluation Patient Details Name: Brandi Bates MRN: [id number] DOB: Jun 12, 1956 Today's Date: 06/19/2011 1350-1415 Ev1  Problem List:  Patient Active Problem List  Diagnoses  . History of breast cancer in female  . Malignant neoplasm of upper-outer quadrant of female breast  . Radiation-induced dermatitis  . Cellulitis  . Hypokalemia  . Nausea & vomiting  . Fever  . Abscess of breast    Past Medical History:  Past Medical History  Diagnosis Date  . Breast cancer left sided     s/p mastectomy, chemo and radiation  . Migraines   . Numbness of feet   . GERD (gastroesophageal reflux disease)   . Chronic low back pain   . Eczema    Past Surgical History:  Past Surgical History  Procedure Date  . Tubal ligation   . Abdominal hysterectomy   . Gallbladder surgery   . 10/24/10 left modified radical mastectomy   . Breast surgery   . Irrigation and debridement abscess 06/16/2011    Procedure: IRRIGATION AND DEBRIDEMENT ABSCESS;  Surgeon: Shelly Rubenstein, MD;  Location: [facility];  Service: General;  Laterality: Left;  incision and drainage of left chest wall abcess    PT Assessment/Plan/Recommendation PT Assessment Clinical Impression Statement: Patient s/p I&D for left breast cellulitis/radiation dermititis and presents at supervision to independent level for mobility.  Encouraged at least daily walks in hallway to preserve mobility status.  No current need for skilled PT follow up. PT Recommendation/Assessment: Patent does not need any further PT services No Skilled PT: Patient is independent with all acitivity/mobility;Patient is supervision for all activity/mobility;Patient will have necessary level of assist by caregiver at discharge PT Goals     PT Evaluation Precautions/Restrictions    Prior Functioning  Home Living Lives With: Spouse Receives Help From: Family Type of Home: House Home Layout: One level Home Access: Stairs to enter Entrance  Stairs-Rails: Right Entrance Stairs-Number of Steps: 5 Prior Function Level of Independence: Independent with basic ADLs;Independent with transfers;Independent with homemaking with ambulation;Independent with gait Driving: Yes Vocation: Full time employment Cognition Cognition Arousal/Alertness: Awake/alert Overall Cognitive Status: Appears within functional limits for tasks assessed Orientation Level: Oriented X4 Sensation/Coordination   Extremity Assessment RLE Assessment RLE Assessment: Within Functional Limits LLE Assessment LLE Assessment: Within Functional Limits Mobility (including Balance) Transfers Sit to Stand: 6: Modified independent (Device/Increase time) Stand to Sit: 6: Modified independent (Device/Increase time) Ambulation/Gait Ambulation/Gait: Yes Ambulation/Gait Assistance: 6: Modified independent (Device/Increase time) Ambulation Distance (Feet): 400 Feet Gait Pattern: Decreased stride length;Trunk flexed Stairs: Yes Stairs Assistance: 5: Supervision Stairs Assistance Details (indicate cue type and reason): step to sequence Stair Management Technique: One rail Right Number of Stairs: 3     Exercise    End of Session PT - End of Session Activity Tolerance: Patient tolerated treatment well Patient left: in chair;with family/visitor present General Behavior During Session: [facility] for tasks performed Cognition: [facility] for tasks performed  [facility] 06/19/2011, 5:05 PM

## 2011-06-19 NOTE — Progress Notes (Signed)
3 Days Post-Op  Subjective: Still having allot of discomfort.  Skin is still quite red from radiation Rx, and tender.  Lajoyce Corners is to small, The opened abscess cavity is clean.  Not really pink, more of a white granular tissue inside the cavity. Pt's awaiting larger binder and enema for constipation.  Objective: Vital signs in last 24 hours: Temp:  [97.8 F (36.6 C)-99.2 F (37.3 C)] 97.8 F (36.6 C) (11/23 0556) Pulse Rate:  [66-76] 76  (11/23 0556) Resp:  [16-19] 16  (11/23 0911) BP: (113-129)/(60-78) 122/78 mmHg (11/23 0556) SpO2:  [92 %-98 %] 93 % (11/23 0911) Last BM Date: 06/18/11  Intake/Output from previous day: 11/22 0701 - 11/23 0700 In: 240 [P.O.:240] Out: 6 [Urine:6] Intake/Output this shift:    General appearance: alert, cooperative and has pain with any manipulation of her upper chest. Chest wall: no tenderness, L side is erythematous, with radiation Rx, the abscess cavity is clean and I packed it with ! fully opened 4x4/NS.  Lab Results:   Basename 06/18/11 0502 06/17/11 0415  WBC 3.1* 4.1  HGB 9.4* 8.5*  HCT 29.8* 26.4*  PLT 219 181    BMET  Basename 06/18/11 0502 06/17/11 0415  NA 140 139  K 3.5 3.4*  CL 106 105  CO2 28 28  GLUCOSE 94 101*  BUN 6 10  CREATININE 0.62 0.69  CALCIUM 9.1 8.2*   PT/INR No results found for this basename: LABPROT:2,INR:2 in the last 72 hours   Studies/Results: No results found.  Anti-infectives: Anti-infectives     Start     Dose/Rate Route Frequency Ordered Stop   06/18/11 1200   levofloxacin (LEVAQUIN) tablet 500 mg        500 mg Oral Daily 06/18/11 0952     06/17/11 0200   vancomycin (VANCOCIN) 1,250 mg in sodium chloride 0.9 % 250 mL IVPB        1,250 mg 166.7 mL/hr over 90 Minutes Intravenous Every 12 hours 06/16/11 1642     06/13/11 1600   vancomycin (VANCOCIN) IVPB 1000 mg/200 mL premix  Status:  Discontinued        1,000 mg 200 mL/hr over 60 Minutes Intravenous Every 12 hours 06/13/11 1504 06/16/11  1640   06/13/11 1215   levofloxacin (LEVAQUIN) IVPB 500 mg  Status:  Discontinued        500 mg 100 mL/hr over 60 Minutes Intravenous Every 24 hours 06/13/11 1214 06/18/11 0952   06/13/11 1200   vancomycin (VANCOCIN) IVPB 1000 mg/200 mL premix        1,000 mg 200 mL/hr over 60 Minutes Intravenous  Once 06/13/11 1111 06/13/11 1253         Current Facility-Administered Medications  Medication Dose Route Frequency Provider Last Rate Last Dose  . 0.9 % NaCl with KCl 20 mEq/ L  infusion   Intravenous Continuous Anand Hongalgi 75 mL/hr at 06/19/11 0909    . acetaminophen (TYLENOL) tablet 650 mg  650 mg Oral Q6H PRN Saima Rizwan   650 mg at 06/17/11 0435   Or  . acetaminophen (TYLENOL) suppository 650 mg  650 mg Rectal Q6H PRN Saima Rizwan   650 mg at 06/16/11 0645  . alum & mag hydroxide-simeth (MAALOX PLUS) 400-400-40 MG/5ML suspension 30 mL  30 mL Oral Q6H PRN Ardeth Sportsman, MD      . bisacodyl (DULCOLAX) suppository 10 mg  10 mg Rectal Daily PRN Anand Hongalgi   10 mg at 06/15/11 1820  . diphenhydrAMINE (BENADRYL)  injection 12.5 mg  12.5 mg Intravenous Q6H PRN Kandis Cocking, MD       Or  . diphenhydrAMINE (BENADRYL) 12.5 MG/5ML elixir 12.5 mg  12.5 mg Oral Q6H PRN Kandis Cocking, MD      . diphenhydrAMINE (BENADRYL) capsule 25 mg  25 mg Oral Q6H PRN Ardeth Sportsman, MD      . diphenhydrAMINE (BENADRYL) injection 25 mg  25 mg Intravenous Q4H PRN Saima Rizwan   25 mg at 06/14/11 1422  . enoxaparin (LOVENOX) injection 40 mg  40 mg Subcutaneous Q24H Saima Rizwan   40 mg at 06/18/11 1738  . Flora-Q (FLORA-Q) Capsule 1 capsule  1 capsule Oral Daily Ardeth Sportsman, MD   1 capsule at 06/19/11 1610  . hydrocerin (EUCERIN) cream 1 application  1 application Topical BID Ardeth Sportsman, MD   1 application at 06/19/11 [phone]  . hydrocortisone cream 1 % 1 application  1 application Topical Q6H PRN Ardeth Sportsman, MD      . HYDROmorphone (DILAUDID) injection 1 mg  1 mg Intravenous Once Kandis Cocking, MD   1 mg at 06/18/11 1558  . HYDROmorphone (DILAUDID) PCA injection 0.3 mg/mL   Intravenous Q4H Kandis Cocking, MD   0.599 mg at 06/19/11 0911  . HYDROmorphone (DILAUDID) tablet 2-4 mg  2-4 mg Oral Q4H PRN Ardeth Sportsman, MD   2 mg at 06/18/11 1334  . lactated ringers infusion   Intravenous Continuous Diana L. Reardon      . levofloxacin (LEVAQUIN) tablet 500 mg  500 mg Oral Daily Jehanzeb Memon   500 mg at 06/19/11 5409  . lip balm (CARMEX) ointment 1 application  1 application Topical BID Ardeth Sportsman, MD   1 application at 06/19/11 [phone]  . LORazepam (ATIVAN) tablet 1 mg  1 mg Oral QHS PRN Saima Rizwan      . LORazepam (ATIVAN) tablet 1 mg  1 mg Oral Q8H PRN Saima Rizwan      . magnesium hydroxide (MILK OF MAGNESIA) suspension 30 mL  30 mL Oral Q8H PRN Ardeth Sportsman, MD      . naloxone [facility]) injection 0.4 mg  0.4 mg Intravenous PRN Kandis Cocking, MD       And  . sodium chloride 0.9 % injection 9 mL  9 mL Intravenous PRN Kandis Cocking, MD      . naproxen (NAPROSYN) tablet 500 mg  500 mg Oral BID WC Ardeth Sportsman, MD   500 mg at 06/19/11 0755  . ondansetron (ZOFRAN) tablet 4 mg  4 mg Oral Q6H PRN Saima Rizwan       Or  . ondansetron (ZOFRAN) injection 4 mg  4 mg Intravenous Q6H PRN Saima Rizwan   4 mg at 06/18/11 1007  . ondansetron (ZOFRAN) injection 4 mg  4 mg Intravenous Q6H PRN Kandis Cocking, MD      . ondansetron (ZOFRAN-ODT) disintegrating tablet 4-8 mg  4-8 mg Oral Q6H PRN Ardeth Sportsman, MD      . polyethylene glycol (MIRALAX / GLYCOLAX) packet 17 g  17 g Oral BID Ardeth Sportsman, MD   17 g at 06/19/11 1478  . PRUTECT emulsion   Topical TID Saima Rizwan      . senna-docusate (Senokot-S) tablet 2 tablet  2 tablet Oral QHS PRN Anand Hongalgi   2 tablet at 06/17/11 2217  . sorbitol, milk of mag, mineral oil, glycerin (SMOG) enema  Rectal Once Ardeth Sportsman, MD      . traMADol Janean Sark) tablet 50-100 mg  50-100 mg Oral Q6H PRN Ardeth Sportsman, MD      . vancomycin  (VANCOCIN) 1,250 mg in sodium chloride 0.9 % 250 mL IVPB  1,250 mg Intravenous Q12H Lorenza Evangelist, PHARMD   1,250 mg at 06/19/11 0146  . witch hazel-glycerin (TUCKS) pad 1 application  1 application Topical PRN Ardeth Sportsman, MD      . zolpidem (AMBIEN) tablet 5-10 mg  5-10 mg Oral QHS PRN Ardeth Sportsman, MD      . DISCONTD: HYDROmorphone (DILAUDID) 4 MG/ML injection        1 mg at 06/18/11 1556  . DISCONTD: HYDROmorphone (DILAUDID) bolus via infusion 0.5-2 mg  0.5-2 mg Intravenous Q30 min PRN Ardeth Sportsman, MD      . DISCONTD: morphine 1 MG/ML PCA injection   Intravenous Q4H Kandis Cocking, MD      . DISCONTD: naloxone [facility]) injection 0.4 mg  0.4 mg Intravenous PRN Kandis Cocking, MD      . DISCONTD: sodium chloride 0.9 % injection 9 mL  9 mL Intravenous PRN Kandis Cocking, MD        Assessment/Plan Breast cancer s/p mastectomy & radiation Rx. with abscess drained 06/16/11. Constipation Patient Active Problem List  Diagnoses  . History of breast cancer in female  . Malignant neoplasm of upper-outer quadrant of female breast  . Radiation-induced dermatitis  . Cellulitis  . Hypokalemia  . Nausea & vomiting  . Fever  . Abscess of breast  Plan: Continue wet to dry dressings, local wound care to Radiation dermatitis,    LOS: 6 days    Jeyren Danowski 06/19/2011

## 2011-06-20 LAB — CREATININE, SERUM
Creatinine, Ser: 0.66 mg/dL (ref 0.50–1.10)
GFR calc Af Amer: >90 mL/min (ref >90)

## 2011-06-20 MED ORDER — HYDROCODONE-ACETAMINOPHEN 5-325 MG PO TABS: 1.0000 | ORAL_TABLET | ORAL | Status: DC | PRN

## 2011-06-20 MED ORDER — POLYETHYLENE GLYCOL 3350 17 G PO PACK: 17.0000 g | PACK | Freq: Two times a day (BID) | ORAL | Status: AC

## 2011-06-20 MED ORDER — HYDROCORTISONE 1 % EX CREA: 1.0000 "application " | TOPICAL_CREAM | Freq: Four times a day (QID) | CUTANEOUS | Status: DC | PRN

## 2011-06-20 MED ORDER — AMOXICILLIN-POT CLAVULANATE 875-125 MG PO TABS: 1.0000 | ORAL_TABLET | Freq: Two times a day (BID) | ORAL | Status: DC

## 2011-06-20 MED ORDER — SODIUM CHLORIDE 0.9 % IV SOLN
3.0000 g | Freq: Four times a day (QID) | INTRAVENOUS | Status: DC
  Administered 2011-06-20: 3 g via INTRAVENOUS
  Filled 2011-06-20 (×5): qty 3

## 2011-06-20 MED ORDER — HYDROCERIN EX CREA: 1.0000 "application " | TOPICAL_CREAM | Freq: Two times a day (BID) | CUTANEOUS | Status: DC

## 2011-06-20 MED ORDER — OXYCODONE-ACETAMINOPHEN 5-325 MG PO TABS: 2.0000 | ORAL_TABLET | ORAL | Status: AC | PRN

## 2011-06-20 MED ORDER — AMOXICILLIN-POT CLAVULANATE 875-125 MG PO TABS: 1.0000 | ORAL_TABLET | Freq: Two times a day (BID) | ORAL | Status: AC

## 2011-06-20 NOTE — Discharge Summary (Signed)
Patient ID: Brandi Bates MRN: [id number] DOB/AGE: [birth year] 55 y.o.  Admit date: 06/13/2011 Discharge date: 06/20/2011  Primary Care Physician:  HOPPER,DAVID, MD  Discharge Diagnoses:     *Abscess of breast, s/p incision and drainage   History of breast cancer in female  Radiation-induced dermatitis  Cellulitis  Hypokalemia  Nausea & vomiting  Fever   Current Discharge Medication List    START taking these medications   Details  amoxicillin-clavulanate (AUGMENTIN) 875-125 MG per tablet Take 1 tablet by mouth 2 (two) times daily. Qty: 20 tablet, Refills: 2    hydrocerin (EUCERIN) CREA Apply 1 application topically 2 (two) times daily. Qty: 113 g, Refills: 0    hydrocortisone cream 1 % Apply 1 application topically every 6 (six) hours as needed. Qty: 30 g, Refills: 0    oxyCODONE-acetaminophen (PERCOCET) 5-325 MG per tablet Take 2 tablets by mouth every 4 (four) hours as needed. Qty: 30 tablet, Refills: 0    polyethylene glycol (MIRALAX / GLYCOLAX) packet Take 17 g by mouth 2 (two) times daily. Qty: 14 each, Refills: 0      CONTINUE these medications which have NOT CHANGED   Details  B Complex-C (B-COMPLEX WITH VITAMIN C) tablet Take 1 tablet by mouth daily.      emollient (BIAFINE) cream Apply 1 application topically 3 (three) times daily.      IBUPROFEN IB PO Take 3 tablets by mouth every 8 (eight) hours as needed. For pain/fever    LORazepam (ATIVAN) 0.5 MG tablet Take 1 mg by mouth at bedtime as needed. For sleep     ondansetron (ZOFRAN) 4 MG tablet Take 4 mg by mouth every 4 (four) hours as needed.        STOP taking these medications     levofloxacin (LEVAQUIN) 750 MG tablet      HYDROcodone-acetaminophen (NORCO) 5-325 MG per tablet      oseltamivir (TAMIFLU) 75 MG capsule         Disposition and Follow-up: follow up Dr. Magnus Ivan in 1 week  Consults:  [facility], Dr. Magnus Ivan  Significant Diagnostic Studies:  No results  found.  Brief H and P: For complete details please refer to admission H and P, but in brief This is a 55 year old female left-sided breast cancer in March 2012. She underwent a mastectomy, chemotherapy and radiation. She just completed her radiation treatments this past week.  Patient comes in with a complaint of fevers and pain in her left chest. She states that 2 days ago she awoke with a fever which was 101.4. She noticed pain in the left side of her chest where she had been receiving her radiation treatments in the area of her prior incision. In addition she had severe nausea and retching. She went to urgent care and it was suspected that she may have an infection and was given " a shot of an antibiotic"discharged with prescriptions for Tamiflu and Levaquin.  She continued to have symptoms of nausea and retching and was unable to take by mouth medications. The pain in the left side of her chest increased and she continued to have low-grade fevers.  When she checked her temperatures last night they were about 99.  Coming to the hospital today because she was not getting any better.   Physical Exam on Discharge:  Filed Vitals:   06/19/11 1321 06/19/11 1410 06/19/11 1526 06/20/11 0513  BP:  104/64  121/73  Pulse:  74  64  Temp:  97.7 F (  36.5 C)  97.8 F (36.6 C)  TempSrc:  Oral  Oral  Resp: 16 18 20 20   Height:      Weight:      SpO2: 94% 97% 94% 97%     Intake/Output Summary (Last 24 hours) at 06/20/11 1136 Last data filed at 06/20/11 0500  Gross per 24 hour  Intake   4242 ml  Output      0 ml  Net   4242 ml    General: Alert, awake, oriented x3, in no acute distress. HEENT: No bruits, no goiter. Heart: Regular rate and rhythm, without murmurs, rubs, gallops. Lungs: Clear to auscultation bilaterally. Abdomen: Soft, nontender, nondistended, positive bowel sounds. Extremities: No clubbing cyanosis or edema with positive pedal pulses. Neuro: Grossly intact,  nonfocal.  CBC:    Component Value Date/Time   WBC 3.1* 06/18/2011 0502   WBC 4.1 05/21/2011 0900   HGB 9.4* 06/18/2011 0502   HGB 13.3 05/21/2011 0900   HCT 29.8* 06/18/2011 0502   HCT 38.8 05/21/2011 0900   PLT 219 06/18/2011 0502   PLT 169 05/21/2011 0900   MCV 86.4 06/18/2011 0502   MCV 86.5 05/21/2011 0900   NEUTROABS 3.2 06/15/2011 0420   NEUTROABS 3.3 05/21/2011 0900   LYMPHSABS 0.4* 06/15/2011 0420   LYMPHSABS 0.4* 05/21/2011 0900   MONOABS 0.4 06/15/2011 0420   MONOABS 0.2 05/21/2011 0900   EOSABS 0.1 06/15/2011 0420   EOSABS 0.1 05/21/2011 0900   BASOSABS 0.0 06/15/2011 0420   BASOSABS 0.0 05/21/2011 0900    Basic Metabolic Panel:    Component Value Date/Time   NA 140 06/18/2011 0502   K 3.5 06/18/2011 0502   CL 106 06/18/2011 0502   CO2 28 06/18/2011 0502   BUN 6 06/18/2011 0502   CREATININE 0.66 06/20/2011 0402   GLUCOSE 94 06/18/2011 0502   CALCIUM 9.1 06/18/2011 0502    Hospital Course:  Principal Problem:  *Abscess of breast Active Problems:  History of breast cancer in female  Radiation-induced dermatitis  Cellulitis  Hypokalemia  Nausea & vomiting  Fever  Patient was initially admitted to the hospital and started on IV antibiotics.  She was seen by [place] surgery and felt that she would need incision and drainage of her abscess.  Patient tolerated this procedure without immediate complications.  Since that time, her wound continues to improve.  Her wound culture was positive for MSSA, and she is on appropriate antibiotics.  She is a febrile, has a normal wbc count and is clinically improved. We will discharge her home today with close follow up.  Time spent on Discharge:  Signed: Ticara Waner 06/20/2011, 11:36 AM

## 2011-06-20 NOTE — Progress Notes (Signed)
CM spoke with pt concerning d/c planning. Pt set up with [facility] for [facility] services. [facility] notified of pt's d/c today. Pt has home DME. Husband to assist in home care

## 2011-06-20 NOTE — Progress Notes (Signed)
PCP: [name], MD  Outpatient Care Team: Patient Care Team: [name] as PCP - General (Family Medicine) Shelly Rubenstein, MD as Consulting Physician (General Surgery)  Inpatient Treatment Team: Treatment Team: Attending Provider: Erick Blinks; Technician: Arvid Right, NT; Rounding Team: Alton Revere; Technician: Lemar Livings, NT; Registered Nurse: Janine Limbo, RN; Consulting Physician: Shelly Rubenstein, MD; Registered Nurse: Era Bumpers; Technician: Evorn Gong, NT; Technician: Gwynneth Albright, NT; Technician: Majel Homer, [place]; Registered Nurse: Violet Baldy Block, RN; Technician: Jacqulyn Ducking, NT   LOS: 7 days   4 Days Post-Op  Procedure(s): IRRIGATION AND DEBRIDEMENT ABSCESS  Subjective:  Soreness down Constipated - Miralax helping somewhat Walking in hallways New binder much easier on chest wall  Objective:  Vital signs:  Temp:  [97.7 F (36.5 C)-97.8 F (36.6 C)] 97.8 F (36.6 C) (11/24 0513) Pulse Rate:  [64-74] 64  (11/24 0513) Resp:  [16-20] 20  (11/24 0513) BP: (104-121)/(64-73) 121/73 mmHg (11/24 0513) SpO2:  [93 %-97 %] 97 % (11/24 0513) Last BM Date: 06/19/11  Intake/Output    from previous day: 11/23 0701 - 11/24 0700 In: 4242 [P.O.:120; I.V.:450; IV Piggyback:3672] Out: -   this shift:    Flatus: yes BM: small few stool balls  Physical Exam:  General: Pt awake/alert/oriented x4 in mild discomfort Eyes: PERRL, normal EOM.  Sclera clear.  No icterus Neuro: CN II-XII intact w/o focal sensory/motor deficits. Lymph: No head/neck/groin lymphadenopathy Psych:  No delerium/psychosis/paranoia HENT: Normocephalic, Mucus membranes moist.  No thrush Neck: Supple, No tracheal deviation Chest: Left chest wall chronic skin & firmness c/w post radiation changes.  Good excursion.  Cellulitis down much.  Wound contracting w poor granulation CV:  Pulses intact.  Regular rhythm Abdomen:  Soft, Nontender/Nondistended.  No incarcerated hernias. Ext:  SCDs BLE.  No mjr edema.  No cyanosis Skin: No petechiae / purpurae  Results:   Labs: Results for orders placed during the hospital encounter of 06/13/11 (from the past 48 hour(s))  CREATININE, SERUM     Status: Normal   Collection Time   06/20/11  4:02 AM      Component Value Range Comment   Creatinine, Ser 0.66  0.50 - 1.10 (mg/dL)    GFR calc non Af Amer >90  >90 (mL/min)    GFR calc Af Amer >90  >90 (mL/min)     Imaging / Studies: @[id number] @  Antibiotics: Anti-infectives     Start     Dose/Rate Route Frequency Ordered Stop   06/18/11 1200   levofloxacin (LEVAQUIN) tablet 500 mg        500 mg Oral Daily 06/18/11 0952     06/17/11 0200   vancomycin (VANCOCIN) 1,250 mg in sodium chloride 0.9 % 250 mL IVPB        1,250 mg 166.7 mL/hr over 90 Minutes Intravenous Every 12 hours 06/16/11 1642     06/13/11 1600   vancomycin (VANCOCIN) IVPB 1000 mg/200 mL premix  Status:  Discontinued        1,000 mg 200 mL/hr over 60 Minutes Intravenous Every 12 hours 06/13/11 1504 06/16/11 1640   06/13/11 1215   levofloxacin (LEVAQUIN) IVPB 500 mg  Status:  Discontinued        500 mg 100 mL/hr over 60 Minutes Intravenous Every 24 hours 06/13/11 1214 06/18/11 0952   06/13/11 1200   vancomycin (VANCOCIN) IVPB 1000 mg/200 mL premix        1,000 mg 200 mL/hr over 60  Minutes Intravenous  Once 06/13/11 1111 06/13/11 1253          Medications / Allergies: per chart  Assessment / Plan: Brandi Bates  55 y.o. female  Procedure(s): IRRIGATION AND DEBRIDEMENT ABSCESS  Problem List:  Principal Problem:  *Abscess of breast with cellulitis  -OSSA - switch to Unasyn only.  D/C w PO Augmentin x 10 days  -Dressing changes daily.  HH setup  -OK to D/C home w close f/u w [facility] (Dr. Magnus Ivan) ~1 week  Active Problems:  History of breast cancer in female  Radiation-induced dermatitis   Hypokalemia  Nausea & vomiting   Fever  -improve PO pain control -enema & miralax bowel regimen for constipation -larger binder working -VTE prophylaxis- SCDs, etc -mobilize as tolerated to help recovery  Lorenso Courier, M.D., F.A.C.S. Gastrointestinal and Minimally Invasive Surgery [facility] [address] [place], [place] [address] [phone] Main / Paging [phone] Voice Mail   06/20/2011

## 2011-06-22 ENCOUNTER — Other Ambulatory Visit: Payer: Self-pay | Admitting: Oncology

## 2011-06-25 ENCOUNTER — Ambulatory Visit (INDEPENDENT_AMBULATORY_CARE_PROVIDER_SITE_OTHER): Payer: BC Managed Care – PPO | Admitting: Surgery

## 2011-06-25 ENCOUNTER — Encounter (INDEPENDENT_AMBULATORY_CARE_PROVIDER_SITE_OTHER): Payer: Self-pay | Admitting: Surgery

## 2011-06-25 VITALS — BP 126/82 | HR 74 | Temp 96.5°F | Resp 16 | Ht 63.5 in | Wt 168.4 lb

## 2011-06-25 DIAGNOSIS — Z09 Encounter for follow-up examination after completed treatment for conditions other than malignant neoplasm: Secondary | ICD-10-CM

## 2011-06-25 NOTE — Progress Notes (Signed)
Subjective:     Patient ID: Brandi Bates, female   DOB: [birth year], 55 y.o.   MRN: [id number]  HPI  She is here for her first postoperative visit status post incision and drainage of a left chest wall abscess to develop status post radiation therapy. She had a previous mastectomy. She is doing fairly well Review of Systems     Objective:   Physical Exam    On exam, there is much less erythema of the chest wall. The wound is contracting very well. Assessment:     Patient status post incision and drainage of chest wall abscess    Plan:     I will now place on hydrogel wound care. I renewed her Percocet. I will see her back in 2 weeks

## 2011-07-06 NOTE — Procedures (Signed)
DIAGNOSIS:  Breast cancer.  NARRATIVE:  Brandi Bates underwent a complex simulation on 05/28/2011 regarding her boost radiation treatment for her left-sided breast cancer.  The patient initially has been planned to receive 50.4 Gy to the left chest wall and left supraclavicular region.  She will subsequently then receive a boost for an additional 10 Gy in 5 fractions at 2 Gy per fraction.  This will be accomplished using an enface electron field, and for this purpose a single additional customized block has been designed for this purpose.  This therefore constitutes a single additional complex treatment device which will be used for her boost treatment.  Based on the depth of the tissue in the target area, 6 MeV electrons will be used and 0.3 cm of bolus will also be used on a daily basis over the treatment area for the entirety of the patient's boost treatment.  This will yield a final total dose of 60.4 Gy.    ______________________________ Radene Gunning, M.D., Ph.D. JSM/MEDQ  D:  07/06/2011  T:  07/06/2011  Job:  [id number]

## 2011-07-06 NOTE — Progress Notes (Signed)
CC:   Peyton Najjar, MD Abigail Miyamoto, M.D. Lowella Dell, M.D.   DIAGNOSIS:  Invasive ductal carcinoma of the left breast.  INDICATION FOR THERAPY:  Curative.  TREATMENT DATES:  04/23/2011 to 06/08/2011.  SITE AND DOSE:  Ms. Gloster was treated initially to a dose of 50.4 Gy to the left chest wall and left supraclavicular region.  This was accomplished using a 4-field technique at 1.8 Gy per fraction.  She then received a boost treatment using a single en face electron field for an additional 10 Gy to the scar plus margin.  This yielded a final total dose of 60.4 Gy.  NARRATIVE:  Ms. Vanzile did satisfactorily during the course of her treatment.  She experienced some skin irritation as expected and this was significant in the supraclavicular region and the upper axilla.  FOLLOWUP APPOINTMENT:  1 month    ______________________________ Radene Gunning, M.D., Ph.D. JSM/MEDQ  D:  07/06/2011  T:  07/06/2011  Job:  [id number]

## 2011-07-06 NOTE — Progress Notes (Signed)
DIAGNOSIS:  Breast cancer.  NARRATIVE:  Ms. Pensabene presented for her first fraction of her boost treatment.  This will consist of a single en face electron field to the scar plus margin.  For this purpose, 0.3 cm of bolus will be used on a daily basis for the remainder of her treatment.  Therefore, a piece of bolus was fashioned with this depth.  This constitutes a simple treatment device, which will be used for her boost treatment on a daily basis.  After this treatment, the patient will have received a total of 60.4 Gy.    ______________________________ Radene Gunning, M.D., Ph.D. JSM/MEDQ  D:  07/06/2011  T:  07/06/2011  Job:  [id number]

## 2011-07-07 ENCOUNTER — Encounter (INDEPENDENT_AMBULATORY_CARE_PROVIDER_SITE_OTHER): Payer: Self-pay | Admitting: Surgery

## 2011-07-07 ENCOUNTER — Ambulatory Visit (INDEPENDENT_AMBULATORY_CARE_PROVIDER_SITE_OTHER): Payer: BC Managed Care – PPO | Admitting: Surgery

## 2011-07-07 VITALS — BP 138/86 | HR 88 | Temp 98.4°F | Resp 20 | Ht 63.5 in | Wt 167.6 lb

## 2011-07-07 DIAGNOSIS — Z09 Encounter for follow-up examination after completed treatment for conditions other than malignant neoplasm: Secondary | ICD-10-CM

## 2011-07-07 NOTE — Progress Notes (Signed)
Subjective:     Patient ID: Brandi Bates, female   DOB: Jul 05, 1956, 55 y.o.   MRN: [id number]  HPI She is here for another postoperative visit status post drainage of a large left chest wall abscess. She complains of nausea after starting tamoxifen. She is still having moderate pain at the access site as well as the skin after radiation  Review of Systems     Objective:   Physical Exam On exam, the cellulitis has resolved. There is still a 3 cm round deep open wound of left chest    Assessment:     Patient status post incision and drainage of left chest wall abscess    Plan:     She will continue hydrogel. I will see her back in about 2 weeks or so. I may consider A cell to the wound

## 2011-07-08 ENCOUNTER — Ambulatory Visit
Admission: RE | Admit: 2011-07-08 | Discharge: 2011-07-08 | Disposition: A | Discharge status: Home / Self Care | Payer: BC Managed Care – PPO | Source: Ambulatory Visit | Attending: Radiation Oncology | Admitting: Radiation Oncology

## 2011-07-08 VITALS — BP 113/76 | HR 73 | Temp 98.1°F | Wt 167.5 lb

## 2011-07-08 DIAGNOSIS — Z853 Personal history of malignant neoplasm of breast: Secondary | ICD-10-CM

## 2011-07-08 NOTE — Progress Notes (Signed)
Followup note:  The patient returns today approximately 1 month following completion of post mastectomy radiation therapy in the management of her locally advanced invasive ductal carcinoma of the left breast. She was scheduled to see me in error, and I am seeing her for Dr. Mitzi Hansen and. One week following completion of radiation therapy she developed a left chest wall infection requiring incision and drainage by Dr. Magnus Ivan. She required a one-week hospitalization with antibiotics. She was seen by Dr. Magnus Ivan yesterday for a followup visit. He is packing a 2-3 cm wound. She started tamoxifen under the direction of Dr. Darnelle Catalan.  Physical examination: Nodes: There is no palpable cervical, supraclavicular, or axillary lymphadenopathy. Chest: Left-sided mastectomy. There is a to 3 cm wound along the left lateral mastectomy scar with packing in place. The packing is not removed. There is no obvious evidence for infection. There is residual erythema and hyperpigmentation of the skin the left chest wall. There is no evidence for recurrent disease. Lungs clear.  Impression: A posttreatment left chest wall abscess. We can expect slow healing because of her previous radiation therapy. If there is a significant delay in healing, want to consider hyperbaric oxygen.  Plan: She'll maintain her followup with Dr. Magnus Ivan. I will schedule her to see Dr. Mitzi Hansen for a followup visit in early January.

## 2011-07-08 NOTE — Progress Notes (Signed)
Pt. Had to be hospitalized for infection of left chestwall. Incision and drainage done and now doing dressing change daily wet to dry saline. Has on dressing now.  Dr.Moody patient being seen by Dr. Dayton Scrape today.

## 2011-07-20 ENCOUNTER — Telehealth (INDEPENDENT_AMBULATORY_CARE_PROVIDER_SITE_OTHER): Payer: Self-pay | Admitting: General Surgery

## 2011-07-20 NOTE — Telephone Encounter (Signed)
PT CALLED RE CHEST WOUND. SLIGHT REDNESS NOTED AROUND EDGES. SMALL AMT YELLOW DRAINAGE/ DR. Barrie Dunker ADVISED/ HE SAID FOR PT TO REMOVE PACKING AND SHOWER THEN REPACK/ IF WOUND DOES NOT IMPROVE OR PT HAS FEVER SHE CAN BE SEEN THIS WEEK. PT NOTIFIED/GY

## 2011-07-22 ENCOUNTER — Telehealth (INDEPENDENT_AMBULATORY_CARE_PROVIDER_SITE_OTHER): Payer: Self-pay | Admitting: General Surgery

## 2011-07-22 NOTE — Telephone Encounter (Signed)
Called Mrs Holstein back and talked to her and she stated that she talked to Dr Magnus Ivan on the phone on Monday 07-20-11. He told her to remove the packing and get in the shower and let the water run over it. The pt stated that the redness has gone down some and there is no pus coming out of the wound. I told Mrs Rohrer if the redness and pus comes back to give me a call that Dr Magnus Ivan and myself are here this Friday and Monday, and to make sure she ask for me

## 2011-07-27 ENCOUNTER — Encounter: Payer: Self-pay | Admitting: *Deleted

## 2011-07-27 DIAGNOSIS — C50919 Malignant neoplasm of unspecified site of unspecified female breast: Secondary | ICD-10-CM | POA: Insufficient documentation

## 2011-07-27 DIAGNOSIS — Z923 Personal history of irradiation: Secondary | ICD-10-CM | POA: Insufficient documentation

## 2011-07-27 NOTE — Progress Notes (Signed)
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

## 2011-07-29 ENCOUNTER — Ambulatory Visit
Admission: RE | Admit: 2011-07-29 | Discharge: 2011-07-29 | Disposition: A | Discharge status: Home / Self Care | Payer: BC Managed Care – PPO | Source: Ambulatory Visit | Attending: Radiation Oncology | Admitting: Radiation Oncology

## 2011-07-29 ENCOUNTER — Encounter: Payer: Self-pay | Admitting: *Deleted

## 2011-07-29 ENCOUNTER — Encounter (INDEPENDENT_AMBULATORY_CARE_PROVIDER_SITE_OTHER): Payer: Self-pay | Admitting: Surgery

## 2011-07-29 ENCOUNTER — Ambulatory Visit (INDEPENDENT_AMBULATORY_CARE_PROVIDER_SITE_OTHER): Payer: BC Managed Care – PPO | Admitting: Surgery

## 2011-07-29 VITALS — BP 132/84 | HR 70 | Temp 96.9°F | Resp 16 | Ht 63.5 in | Wt 167.0 lb

## 2011-07-29 DIAGNOSIS — Z09 Encounter for follow-up examination after completed treatment for conditions other than malignant neoplasm: Secondary | ICD-10-CM

## 2011-07-29 DIAGNOSIS — C50419 Malignant neoplasm of upper-outer quadrant of unspecified female breast: Secondary | ICD-10-CM

## 2011-07-29 NOTE — Progress Notes (Signed)
Subjective:     Patient ID: Brandi Bates, female   DOB: 08/22/1955, 56 y.o.   MRN: [id number]  HPI She has no complaints today. She is still treating the wound daily with hydrogel dressing changes  Review of Systems     Objective:   Physical Exam On exam, the wound measures 2 cm x 1 cm. It is still several millimeters deep. There is no evidence of ongoing infection    Assessment:     Patient with nonhealing surgical wound status post drainage of abscess of the left chest mastectomy site    Plan:     I am going to discuss this with one of the A cell representatives and see if she would be appropriate for placement of a dermal graft to see if we can get this wound to heal. I will see her back in approximately 3 weeks.

## 2011-07-29 NOTE — Progress Notes (Signed)
Takes Percocet approx q3-4 days when she over-exerts herself, for pain in L breast/chest wall.  Pt's spouse packs area w/saline gauze once daily, has told pt the area is closing in. Pt has FU w/Dr Magnus Ivan this afternoon.

## 2011-07-29 NOTE — Progress Notes (Signed)
Clinical Social Work met with pt today in office to complete advance directives. Pt completed healthcare living will and healthcare power of attorney document. Pt designated Kellie Moor as her primary healthcare agent. CSW will send documents to medical records to be scanned in. Advance Directives can be found in the Chart Review section under the Media Tab titled "social Work" documents. Pt had no other needs at this time.  Kathrin Penner, MSW, [facility] Clinical Social Worker [facility] [phone]

## 2011-07-30 NOTE — Progress Notes (Signed)
CC:   Peyton Najjar, MD Abigail Miyamoto, M.D. Lowella Dell, M.D.  DIAGNOSIS:  Invasive ductal carcinoma of the left breast.  INTERVAL HISTORY:  Ms. Fulp returns to clinic today for followup.  She completed her adjuvant radiation on 06/08/2011.  Shortly after finishing her radiation, the patient developed an abscess involving the lateral aspect of her surgical incision.  She was hospitalized for approximately 1 week while she was being started on antibiotics for this.  She has continued to see Dr. Magnus Ivan for management of this wound, and she is scheduled to see him later today.  She notes that this area has improved slowly over the last several weeks with no worsening issues in terms of redness or discomfort.  PHYSICAL EXAMINATION:  The patient continues to have a 2 to 3 cm wound along the lateral aspect of the mastectomy scar, which is packed today. There was some firmness around this area but no significant erythema or other changes indicative of an ongoing infection.  IMPRESSION/PLAN:  Ms. Rena continues to slowly recover from her left chest wall abscess.  Her skin has been healing adequately elsewhere. She does see Dr. Magnus Ivan later today, and I will have her return to clinic in 2 months for ongoing followup.  I did discuss with her possible options and did specifically talk to her about the fact that sometimes we refer patients for hyperbaric oxygen.  She has also discussed other alternatives with Dr. Magnus Ivan as well.    ______________________________ Radene Gunning, M.D., Ph.D. JSM/MEDQ  D:  07/30/2011  T:  07/30/2011  Job:  [id number]

## 2011-08-19 ENCOUNTER — Ambulatory Visit (INDEPENDENT_AMBULATORY_CARE_PROVIDER_SITE_OTHER): Payer: BC Managed Care – PPO | Admitting: Surgery

## 2011-08-19 ENCOUNTER — Encounter (INDEPENDENT_AMBULATORY_CARE_PROVIDER_SITE_OTHER): Payer: Self-pay | Admitting: Surgery

## 2011-08-19 VITALS — BP 127/83 | HR 78 | Temp 97.7°F | Resp 20 | Ht 63.5 in | Wt 165.4 lb

## 2011-08-19 DIAGNOSIS — S21009A Unspecified open wound of unspecified breast, initial encounter: Secondary | ICD-10-CM

## 2011-08-19 NOTE — Progress Notes (Signed)
Subjective:     Patient ID: Brandi Bates, female   DOB: 1956/03/23, 56 y.o.   MRN: [id number]  HPI  She is here today for another wound check. She has no complaints other than sensitivity of the skin Review of Systems     Objective:   Physical Exam On exam, the wound is not contracted any further. There still furtherwhich I cleared off. At this point we placed a-cell powder and a graft into the wound. We secured this with Steri-Strips. We then placed Adaptic and Gelfoam the wound. We then finally placed a piece of saline gauze and Tegaderm. 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 She will leave the dressing in place and we will see her again next week. I renewed her Percocet.

## 2011-08-26 ENCOUNTER — Ambulatory Visit (INDEPENDENT_AMBULATORY_CARE_PROVIDER_SITE_OTHER): Payer: BC Managed Care – PPO | Admitting: Surgery

## 2011-08-26 ENCOUNTER — Encounter (INDEPENDENT_AMBULATORY_CARE_PROVIDER_SITE_OTHER): Payer: Self-pay | Admitting: Surgery

## 2011-08-26 VITALS — BP 126/76 | HR 68 | Temp 97.9°F | Resp 18 | Ht 63.5 in | Wt 169.6 lb

## 2011-08-26 DIAGNOSIS — Z09 Encounter for follow-up examination after completed treatment for conditions other than malignant neoplasm: Secondary | ICD-10-CM

## 2011-08-26 NOTE — Progress Notes (Signed)
Subjective:     Patient ID: Brandi Bates, female   DOB: 1956-02-11, 56 y.o.   MRN: [id number]  HPI She is here for a visit status post placement of a xenograft on the left chest wall. She has minimal complaints  Review of Systems     Objective:   Physical Exam    On exam, the xenograft remains intact at the abscess site. There is no evidence of infection Assessment:     Patient with nonhealing postoperative wound now status post xenograft placement    Plan:     We replaced a dressing. I will see her back again next week

## 2011-09-01 ENCOUNTER — Encounter (INDEPENDENT_AMBULATORY_CARE_PROVIDER_SITE_OTHER): Payer: Self-pay | Admitting: Surgery

## 2011-09-01 ENCOUNTER — Encounter (INDEPENDENT_AMBULATORY_CARE_PROVIDER_SITE_OTHER): Payer: Self-pay | Admitting: General Surgery

## 2011-09-01 ENCOUNTER — Ambulatory Visit (INDEPENDENT_AMBULATORY_CARE_PROVIDER_SITE_OTHER): Payer: BC Managed Care – PPO | Admitting: Surgery

## 2011-09-01 VITALS — BP 120/82 | HR 88 | Temp 97.7°F | Resp 18 | Ht 63.5 in | Wt 166.2 lb

## 2011-09-01 DIAGNOSIS — Z09 Encounter for follow-up examination after completed treatment for conditions other than malignant neoplasm: Secondary | ICD-10-CM

## 2011-09-01 DIAGNOSIS — T8189XA Other complications of procedures, not elsewhere classified, initial encounter: Secondary | ICD-10-CM

## 2011-09-01 NOTE — Progress Notes (Signed)
Subjective:     Patient ID: Brandi Bates, female   DOB: [birth year], 56 y.o.   MRN: [id number]  HPI She is here for another visit. She still has some discomfort and over from the wound. Otherwise she is doing well  Review of Systems     Objective:   Physical Exam On exam, the xenograft is still present in the deeper aspect of the wound. Granulation tissue is now starting to form. There is no evidence of infection    Assessment:     Patient with nonhealing surgical wound    Plan:     Today we reapplied another xenograft in the office along with powder. I again secured with Steri-Strips and then placed Adaptic and gauze on the wound.  I will see her back in one week

## 2011-09-04 ENCOUNTER — Other Ambulatory Visit: Payer: Self-pay | Admitting: Obstetrics and Gynecology

## 2011-09-07 ENCOUNTER — Encounter (INDEPENDENT_AMBULATORY_CARE_PROVIDER_SITE_OTHER): Payer: Self-pay | Admitting: Surgery

## 2011-09-07 ENCOUNTER — Ambulatory Visit (INDEPENDENT_AMBULATORY_CARE_PROVIDER_SITE_OTHER): Payer: BC Managed Care – PPO | Admitting: Surgery

## 2011-09-07 VITALS — BP 122/78 | HR 68 | Temp 97.4°F | Resp 18 | Ht 63.5 in | Wt 167.4 lb

## 2011-09-07 DIAGNOSIS — Z09 Encounter for follow-up examination after completed treatment for conditions other than malignant neoplasm: Secondary | ICD-10-CM

## 2011-09-07 NOTE — Progress Notes (Signed)
Subjective:     Patient ID: Brandi Bates, female   DOB: 1956/03/28, 56 y.o.   MRN: [id number]  HPI  She is here for another wound check. She has no complaints today Review of Systems     Objective:   Physical Exam On exam, the wound has contracted slightly further and they're slightly more granulation tissue. The xenograft remains in the deepest part of the wound    Assessment:     Nonhealing surgical wound    Plan:     We will now do wet-to-dry dressings daily for the next 2 weeks and I will see her back then

## 2011-09-10 ENCOUNTER — Other Ambulatory Visit (HOSPITAL_BASED_OUTPATIENT_CLINIC_OR_DEPARTMENT_OTHER): Payer: BC Managed Care – PPO | Admitting: Lab

## 2011-09-10 DIAGNOSIS — Z170 Estrogen receptor positive status [ER+]: Secondary | ICD-10-CM

## 2011-09-10 DIAGNOSIS — C50919 Malignant neoplasm of unspecified site of unspecified female breast: Secondary | ICD-10-CM

## 2011-09-10 DIAGNOSIS — R5383 Other fatigue: Secondary | ICD-10-CM

## 2011-09-10 DIAGNOSIS — R5381 Other malaise: Secondary | ICD-10-CM

## 2011-09-10 DIAGNOSIS — Z5111 Encounter for antineoplastic chemotherapy: Secondary | ICD-10-CM

## 2011-09-10 LAB — COMPREHENSIVE METABOLIC PANEL
ALT: 27 U/L (ref 0–35)
Alkaline Phosphatase: 61 U/L (ref 39–117)
CO2: 25 mEq/L (ref 19–32)
Sodium: 142 mEq/L (ref 135–145)
Total Bilirubin: 0.3 mg/dL (ref 0.3–1.2)
Total Protein: 6.8 g/dL (ref 6.0–8.3)

## 2011-09-10 LAB — CBC WITH DIFFERENTIAL/PLATELET
BASO%: 0.2 % (ref 0.0–2.0)
LYMPH%: 18.8 % (ref 14.0–49.7)
MCHC: 34.5 g/dL (ref 31.5–36.0)
MONO#: 0.2 10*3/uL (ref 0.1–0.9)
Platelets: 178 10*3/uL (ref 145–400)
RBC: 4.39 10*6/uL (ref 3.70–5.45)
WBC: 4.6 10*3/uL (ref 3.9–10.3)

## 2011-09-17 ENCOUNTER — Ambulatory Visit (HOSPITAL_BASED_OUTPATIENT_CLINIC_OR_DEPARTMENT_OTHER): Payer: BC Managed Care – PPO | Admitting: Oncology

## 2011-09-17 ENCOUNTER — Telehealth: Payer: Self-pay | Admitting: Oncology

## 2011-09-17 VITALS — BP 126/80 | HR 93 | Temp 98.7°F | Ht 63.5 in | Wt 167.7 lb

## 2011-09-17 DIAGNOSIS — C50919 Malignant neoplasm of unspecified site of unspecified female breast: Secondary | ICD-10-CM

## 2011-09-17 NOTE — Telephone Encounter (Signed)
gve the pt her may 2013 appt calendar along with the mammo appt 

## 2011-09-17 NOTE — Progress Notes (Signed)
ID: Brandi Bates   DOB: 03-20-1956  MR#: [id number]  ZHY#:[id number]  HISTORY OF PRESENT ILLNESS: The patient felt a mass in her left axilla several months ago.  She did not bring this to her physician's attention because she wasn't sure it really meant anything, but she asked her daughter, and her daughter asked a nurse fried, and they told her she better get on and have this evaluated, so she did bring it to [name] attention and he set her up for diagnostic mammography and ultrasonography performed at [facility] on September 22, 2010.  Dr. [name] was able to demonstrate a spiculated density at the 12 o'clock position of the breast with pleomorphic calcifications measuring about 4 cm.  There was a 2nd focal rounded area of increased density and with the axilla, there was an ovoid mass measuring up to 3.8 cm. Ultrasound showed the area of heterogeneous decreased echogenicity as well as the 2nd discrete focus.  The right breast showed only a cystic mass measuring 1 cm which was of no consequence.  Biopsy was performed of the left breast mass only on the same day.  The pathology report ([id number]) biopsied 2 masses in the left breast.  The second mass was "located more superiorly" and was also identified and biopsied.  The pathology report labels both masses "at 12 o'clock" so we will have to clarify this issue.  At any rate, both masses were Grade 3 invasive ductal carcinoma.  The first one was estrogen receptor positive at 61% but progesterone receptor negative with a proliferation marker of 95%.  The second one was ER positive at 98% and PR "positive" at 3%, also with a proliferation marker of 95%.  Both showed no HER2 amplification.  With this information, the patient was referred for breast MRI.  This was performed September 30, 2010 and showed the main breast mass to measure 3.8 cm.  The posterior mass measured 6 millimeters.  The liver appeared mottled and further evaluation was suggested.  There were some  confluent axillary masses to a maximum measurement of 6.8 cm.    INTERVAL HISTORY: Since the last visit here she completed radiation, but then developed an abscess in the left chest wall, requiring drainage and packing. It is now much better, about 1-1/2 cm of in diameter and perhaps a half a centimeter deep. She started tamoxifen on December 1.  REVIEW OF SYSTEMS: She still feels tired, she says at because of the continuing problem with the abscess. But in addition she sleeps poorly. She never slept are well, but now she goes to bed, falls asleep, and an hour and a half later she is up. That is up for several hours. In the morning she is tired. She is not really able to pinpoint what is keeping her up, but certainly hot flashes aren't major issue right now for her I do think she would benefit from night time gabapentin. Otherwise a detailed review of systems today was stable.  PAST MEDICAL HISTORY: Past Medical History  Diagnosis Date  . Migraines   . Numbness of feet   . GERD (gastroesophageal reflux disease)   . Chronic low back pain   . Eczema   . Breast cancer 10/23/10    s/p L mastectomy, chemo/radiation, er/pr +, Her2 -  . Lymphedema of arm     left  . History of radiation therapy 04/23/11 thru 06/08/11    L breast  Significant for history of migraines, history of GERD, history of chronic  constipation, history of chronic low back pain, history of tobacco abuse, the patient quitting in 1998 with approximately a 50 pack-year history prior to that.  The patient is status post tubal ligation, status post simple hysterectomy without salpingo-oophorectomy and status post cholecystectomy.  PAST SURGICAL HISTORY: Past Surgical History  Procedure Date  . Tubal ligation   . Abdominal hysterectomy   . Gallbladder surgery   . 10/24/10 left modified radical mastectomy   . Breast surgery   . Irrigation and debridement abscess 06/16/2011    Procedure: IRRIGATION AND DEBRIDEMENT ABSCESS;  Surgeon:  Harl Bowie, MD;  Location: [facility];  Service: General;  Laterality: Left;  incision and drainage of left chest wall abcess  . Cholecystectomy     FAMILY HISTORY Family History  Problem Relation Age of Onset  . Cancer Mother     lung  . Hypertension Mother   . Cancer Father     lung  . Cancer Brother     BRAIN CANCER  . Hypertension Brother   . Cancer Cousin      2 PATERNAL COUSINS - BREAST CA  . Hypertension Sister   . Hypertension Maternal Uncle   . Hypertension Maternal Grandmother   The patient's father died at the age of 38 and the patient's mother at the age of 35.  Both were smokers and both had lung cancer. The patient had one brother who died at age 70 with glioblastoma multiforme.  She has one surviving brother, one surviving sister who is present today, and a half-brother.  There are two cousins, both on the father's side, who had breast cancer in their 74s.  There is one uncle with Leverne Humbles disease.  GYNECOLOGIC HISTORY: She is GX P1.  First pregnancy to term at age 4.  She never took hormone replacement.    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  Her husband, [name], is a Geologist, engineering for an [employer].  Daughter Leveda Anna works at the same office as her mother.  Son Louie Casa works for [employer].  The patient has 7 grandchildren. She attends a [employer].     ADVANCED DIRECTIVES:  HEALTH MAINTENANCE: History  Substance Use Topics  . Smoking status: Former Smoker    Quit date: 05/31/1997  . Smokeless tobacco: Never Used  . Alcohol Use: No     Colonoscopy:  PAP: s/p hysterectomy  Bone density: [facility] October 2012, T - 1.4 at [facility]  Lipid panel:  Allergies  Allergen Reactions  . Morphine And Related Hives and Itching    All over the body  . Tylox Nausea And Vomiting  . Latex Rash    Only where touched    Current Outpatient Prescriptions  Medication  Sig Dispense Refill  . B Complex-C (B-COMPLEX WITH VITAMIN C) tablet Take 1 tablet by mouth daily.        . hydrocerin (EUCERIN) CREA Apply 1 application topically 2 (two) times daily.  113 g  0  . hydrocortisone cream 1 % Apply 1 application topically every 6 (six) hours as needed.  30 g  0  . IBUPROFEN IB PO Take 3 tablets by mouth every 8 (eight) hours as needed. For pain/fever      . LORazepam (ATIVAN) 0.5 MG tablet Take 1 mg by mouth at bedtime as needed. For sleep       . ondansetron (ZOFRAN)  4 MG tablet Take 4 mg by mouth every 4 (four) hours as needed.        Marland Kitchen oxyCODONE-acetaminophen (PERCOCET) 5-325 MG per tablet Ad lib.      . polyethylene glycol (MIRALAX / GLYCOLAX) packet as needed.      . tamoxifen (NOLVADEX) 20 MG tablet daily.        OBJECTIVE: Middle-aged white woman in no obvious distress Filed Vitals:   09/17/11 0932  BP: 126/80  Pulse: 93  Temp: 98.7 F (37.1 C)     Body mass index is 29.24 kg/(m^2).    ECOG FS: 0  Sclerae unicteric Oropharynx clear No peripheral adenopathy Lungs no rales or rhonchi Heart regular rate and rhythm Abd benign MSK no focal spinal tenderness, no peripheral edema Neuro: nonfocal Breasts: Right breast, no suspicious finding. The left breast is status post mastectomy. The abscess area is bandaged over and I did not uncover it. Overall in the left chest wall there is no evidence of local recurrence  LAB RESULTS: Lab Results  Component Value Date   WBC 4.6 09/10/2011   NEUTROABS 3.4 09/10/2011   HGB 12.9 09/10/2011   HCT 37.2 09/10/2011   MCV 84.7 09/10/2011   PLT 178 09/10/2011      Chemistry      Component Value Date/Time   NA 142 09/10/2011 1328   K 3.7 09/10/2011 1328   CL 106 09/10/2011 1328   CO2 25 09/10/2011 1328   BUN 14 09/10/2011 1328   CREATININE 0.71 09/10/2011 1328      Component Value Date/Time   CALCIUM 9.2 09/10/2011 1328   ALKPHOS 61 09/10/2011 1328   AST 21 09/10/2011 1328   ALT 27 09/10/2011 1328   BILITOT 0.3  09/10/2011 1328       Lab Results  Component Value Date   LABCA2 22 09/10/2011    No results found for this basename: INR:1;PROTIME:1 in the last 168 hours  No results found for this basename: UACOL:1,UAPR:1,USPG:1,UPH:1,UTP:1,UGL:1,UKET:1,UBIL:1,UHGB:1,UNIT:1,UROB:1,ULEU:1,UEPI:1,UWBC:1,URBC:1,UBAC:1,CAST:1,CRYS:1,UCOM:1,BILUA:1 in the last 72 hours   STUDIES: No new results found.  ASSESSMENT: A 56 year old [place] woman status post left modified radical mastectomy March of 2012 for a T3 N3a (stage IIIC) grade 3 invasive ductal carcinoma which was strongly estrogen receptor positive, progesterone receptor and HER2 negative, with an MIB-1 of 95%.  She had some local regional spread including a left internal mammary lymph node but no evidence of distant metastatic disease.  She completed adjuvantly 4 cycles of dose dense doxorubicin and cyclophosphamide, and 11 doses of weekly paclitaxel, followed by radiation which she completed November 2012, at which time she started tamoxifen  PLAN: She is tolerating the tamoxifen moderately well, except for problems with hot flashes. I think gabapentin at bedtime would help her sleep a little bit better in that regard, and I wrote her that prescription. She is aware of the possible toxicities side effects and complications of this medication, which generally is very well tolerated when used in this fashion. She certainly will call us if there are new problems. Otherwise she will continue to follow her wounds to Dr. Rush Farmer and she will see Dr. Lisbeth Renshaw I believe early next month. She will see [place] again in 3 months. I have set her up for a repeat mammography on the right axilla sometime next week.    Karlina Suares C    09/17/2011

## 2011-09-21 ENCOUNTER — Ambulatory Visit (INDEPENDENT_AMBULATORY_CARE_PROVIDER_SITE_OTHER): Payer: BC Managed Care – PPO | Admitting: Surgery

## 2011-09-21 ENCOUNTER — Encounter (INDEPENDENT_AMBULATORY_CARE_PROVIDER_SITE_OTHER): Payer: Self-pay | Admitting: Surgery

## 2011-09-21 VITALS — BP 124/84 | HR 80 | Temp 97.5°F | Resp 16 | Ht 63.5 in | Wt 167.0 lb

## 2011-09-21 DIAGNOSIS — T8189XA Other complications of procedures, not elsewhere classified, initial encounter: Secondary | ICD-10-CM

## 2011-09-21 NOTE — Progress Notes (Signed)
Subjective:     Patient ID: Brandi Bates, female   DOB: [birth year], 56 y.o.   MRN: [id number]  HPI She is here for followup of her stent graft placement. She is now on gabapentin for Dr. Darnelle Catalan started. I believe this is significantly helping her discomfort from the mastectomy and radiation. She feels great and looks great today  Review of Systems     Objective:   Physical Exam On exam, the wound continues to contract. Granulation tissue is  starting to form    Assessment:    Nonhealing surgical wound    Plan:     We placed a xenograft again today. I will see her back next week for wound check

## 2011-09-25 ENCOUNTER — Telehealth: Payer: Self-pay | Admitting: *Deleted

## 2011-09-25 ENCOUNTER — Encounter: Payer: Self-pay | Admitting: *Deleted

## 2011-09-25 DIAGNOSIS — C50919 Malignant neoplasm of unspecified site of unspecified female breast: Secondary | ICD-10-CM

## 2011-09-25 MED ORDER — CLONAZEPAM 0.5 MG PO TABS: 0.5000 mg | ORAL_TABLET | Freq: Every evening | ORAL | Status: DC | PRN

## 2011-09-25 NOTE — Progress Notes (Unsigned)
Pt called to state since starting the gabapentin per sleep issues discussed with MD at last visit.  Brandi Bates states she has used klonipin in the past with benefit and still has some ( unsure how old ) and would like to reinstitute it if it does not interfere with the tamoxifen.  This RN informed Brandi Bates above would be appropriate and medication called in to pharmacy.  Of note pt is not using ativan as per medication list and med removed.

## 2011-09-29 ENCOUNTER — Encounter: Payer: Self-pay | Admitting: *Deleted

## 2011-09-29 NOTE — Telephone Encounter (Signed)
No entry needed

## 2011-09-30 ENCOUNTER — Encounter (INDEPENDENT_AMBULATORY_CARE_PROVIDER_SITE_OTHER): Payer: Self-pay | Admitting: Surgery

## 2011-09-30 ENCOUNTER — Ambulatory Visit (INDEPENDENT_AMBULATORY_CARE_PROVIDER_SITE_OTHER): Payer: BC Managed Care – PPO | Admitting: Surgery

## 2011-09-30 VITALS — BP 139/80 | HR 74 | Temp 97.7°F | Resp 16 | Ht 63.5 in | Wt 169.2 lb

## 2011-09-30 DIAGNOSIS — T8189XA Other complications of procedures, not elsewhere classified, initial encounter: Secondary | ICD-10-CM

## 2011-09-30 NOTE — Progress Notes (Signed)
Subjective:     Patient ID: Brandi Bates, female   DOB: May 01, 1956, 56 y.o.   MRN: [id number]  HPI She is here today for another wound check status post endograft placement. She is doing well and has no complaints.  Review of Systems     Objective:   Physical Exam On exam, her wound is now showing much better granulation tissue. The deepest area is now building up much more. There is no evidence of infection. At this point, we reapplied the xenograft powdering sheet as this appears to be working very well now.    Assessment:     Nonhealing surgical wound status post xenograft placement    Plan:     We will continue placement of the xenograft and I'll see her back next week

## 2011-10-01 ENCOUNTER — Encounter: Payer: Self-pay | Admitting: *Deleted

## 2011-10-01 ENCOUNTER — Telehealth: Payer: Self-pay | Admitting: Oncology

## 2011-10-01 NOTE — Telephone Encounter (Signed)
S/w kim from [facility] and the pt is scheduled for her appt on 10/02/2011@8 :00am

## 2011-10-02 ENCOUNTER — Encounter: Payer: Self-pay | Admitting: Radiation Oncology

## 2011-10-02 ENCOUNTER — Ambulatory Visit
Admission: RE | Admit: 2011-10-02 | Discharge: 2011-10-02 | Disposition: A | Discharge status: Home / Self Care | Payer: BC Managed Care – PPO | Source: Ambulatory Visit | Attending: Radiation Oncology | Admitting: Radiation Oncology

## 2011-10-02 VITALS — BP 119/79 | HR 77 | Temp 98.0°F | Resp 20 | Wt 168.6 lb

## 2011-10-02 DIAGNOSIS — C50419 Malignant neoplasm of upper-outer quadrant of unspecified female breast: Secondary | ICD-10-CM

## 2011-10-02 NOTE — Progress Notes (Signed)
[facility] Radiation Oncology Follow up Note  Name: Brandi Bates   Date: 10/02/2011    MRN: [id number] DOB: 12-Jun-1956  CC:  Gaye Alken, MD, MD  Magrinat, Valentino Hue, MD  DIAGNOSIS: Invasive ductal carcinoma of the left breast      ALLERGIES: Morphine and related; Gabapentin; Tylox; and Latex   MEDICATIONS:  Current Outpatient Prescriptions  Medication Sig Dispense Refill  . B Complex-C (B-COMPLEX WITH VITAMIN C) tablet Take 1 tablet by mouth daily.        . clonazePAM (KLONOPIN) 0.5 MG tablet Take 1 tablet (0.5 mg total) by mouth at bedtime as needed for anxiety.  30 tablet  3  . IBUPROFEN IB PO Take 3 tablets by mouth every 8 (eight) hours as needed. For pain/fever      . oxyCODONE-acetaminophen (PERCOCET) 5-325 MG per tablet Ad lib.      . polyethylene glycol (MIRALAX / GLYCOLAX) packet as needed.      . tamoxifen (NOLVADEX) 20 MG tablet daily.         NARRATIVE: The patient is seen today for ongoing followup. She completed her course of radiation on 06/08/2011. The patient indicates that her skin in the treatment area in general has improved. She continues to have an area that has healed very slowly and remains open. She is seeing Dr. Magnus Ivan on a weekly basis. This has been improving slowly.   PHYSICAL EXAM:   weight is 168 lb 9.6 oz (76.476 kg). Her oral temperature is 98 F (36.7 C). Her blood pressure is 119/79 and her pulse is 77. Her respiration is 20.  The treatment area shows some residual hyperpigmentation. The greatest area of concern is bandaged at this time.   LABORATORY DATA:  Lab Results  Component Value Date   WBC 4.6 09/10/2011   HGB 12.9 09/10/2011   HCT 37.2 09/10/2011   MCV 84.7 09/10/2011   PLT 178 09/10/2011   Lab Results  Component Value Date   NA 142 09/10/2011   K 3.7 09/10/2011   CL 106 09/10/2011   CO2 25 09/10/2011   Lab Results  Component Value Date   ALT 27 09/10/2011   AST 21 09/10/2011   ALKPHOS 61 09/10/2011   BILITOT 0.3 09/10/2011       RADIOGRAPHIC STUDIES:  The patient underwent a mammogram earlier today. The patient states that there was a concern regarding the right breast and she is being set up for a biopsy. I've been given a report for this and there were a couple of areas which were suspicious.    IMPRESSION: The patient has been healing from her prior treatment including her radiation. Her skin overall in general looks okay in the treatment area but there is a small focus which is continuing to be managed and there is some healing which needs to occur. As noted above the patient is proceeding with a couple of biopsies within the right breast. Hopefully these will return with benign findings.    PLAN: The patient is going to return at this time on a when necessary basis. I will followup on her upcoming results with regards to the right breast and if we need to get her back and to clinic in we will certainly schedule this. Hopefully these do not represent areas of cancer.   I spent 10 minutes minutes face to face with the patient and more than 50% of that time was spent in counseling and/or coordination of care.

## 2011-10-02 NOTE — Progress Notes (Signed)
Pt has been undergoing weekly endograft to L chest wall, Dr Magnus Ivan. She states area constantly sore.  Pt had mammogram this morning, states she may have to have biopsy of area in right breast.

## 2011-10-06 ENCOUNTER — Other Ambulatory Visit: Payer: Self-pay | Admitting: Radiology

## 2011-10-09 ENCOUNTER — Encounter (INDEPENDENT_AMBULATORY_CARE_PROVIDER_SITE_OTHER): Payer: Self-pay | Admitting: Surgery

## 2011-10-09 ENCOUNTER — Ambulatory Visit (INDEPENDENT_AMBULATORY_CARE_PROVIDER_SITE_OTHER): Payer: BC Managed Care – PPO | Admitting: Surgery

## 2011-10-09 VITALS — BP 142/80 | HR 74 | Resp 16 | Ht 63.5 in | Wt 168.0 lb

## 2011-10-09 DIAGNOSIS — T8189XA Other complications of procedures, not elsewhere classified, initial encounter: Secondary | ICD-10-CM

## 2011-10-09 NOTE — Progress Notes (Signed)
Subjective:     Patient ID: Brandi Bates, female   DOB: 10-30-1955, 56 y.o.   MRN: [id number]  HPI She is back today for another wound check. She has had a stereotactic biopsy of her other breast and the pathology is still pending although is felt to be benign. She still a severe discomfort of her left chest  Review of Systems     Objective:   Physical Exam    The wound continues to slowly contract. More granulation tissue starting to build in the base of the wound. Assessment:     Nonhealing surgical wound status post xenograft placement.    Plan:     I placed more of the xenograft powder into the wound today. She will leave this dressing on for 3 days and then start wet-to-dry dressing changes. I will then see her back in a week

## 2011-10-19 ENCOUNTER — Ambulatory Visit (INDEPENDENT_AMBULATORY_CARE_PROVIDER_SITE_OTHER): Payer: BC Managed Care – PPO | Admitting: Surgery

## 2011-10-19 ENCOUNTER — Encounter (INDEPENDENT_AMBULATORY_CARE_PROVIDER_SITE_OTHER): Payer: Self-pay | Admitting: Surgery

## 2011-10-19 VITALS — BP 128/84 | HR 72 | Temp 97.7°F | Resp 14 | Ht 63.5 in | Wt 167.4 lb

## 2011-10-19 DIAGNOSIS — T8189XA Other complications of procedures, not elsewhere classified, initial encounter: Secondary | ICD-10-CM

## 2011-10-19 NOTE — Progress Notes (Signed)
Subjective:     Patient ID: Brandi Bates, female   DOB: 06-17-1956, 56 y.o.   MRN: [id number]  HPI She is back today for another wound check. She has minimal discomfort on the left chest wound.  Review of Systems     Objective:   Physical Exam On exam, the wound has granulated in much better. There is still a deep crevice laterally the breast the wound is clean. We applied more of a xenograft power and then a sheet to the wound. We then covered this with Adaptic hydrogel.    Assessment:     Nonhealing surgical wound which is now improving status post xenograft placement    Plan:     Another xenograft was then placed today. I will see her back in one week

## 2011-10-23 ENCOUNTER — Telehealth: Payer: Self-pay | Admitting: *Deleted

## 2011-10-23 HISTORY — PX: MASTECTOMY MODIFIED RADICAL: SUR848

## 2011-10-23 NOTE — Telephone Encounter (Signed)
Pt called to this RN to inquire " I was told I have carcinoma in my right breast ... Can someone explain this to me ".  This RN reviewed records and noted pt had FNA recently with result positive for cancer.  Pt has history of left breast cancer s/p surgery and radiation with left wall abscess being treated under Dr Rayburn Ma.  Per discussion this RN explained to pt bx is positive for breast ca but unknown if same breast ca as in left. Above needs further work up and review by MD and presented at the  [facility] discussion.  This RN reviewed data with Kaylyn Lim.  Appointment made for Wednesday 10/28/2011 at 1030 for lab and 11am with MD.  Per Tami pt is on the [place] for presentation that AM.  All the above reviewed with pt who verbalized understanding.

## 2011-10-26 ENCOUNTER — Encounter (INDEPENDENT_AMBULATORY_CARE_PROVIDER_SITE_OTHER): Payer: Self-pay | Admitting: Surgery

## 2011-10-26 ENCOUNTER — Ambulatory Visit (INDEPENDENT_AMBULATORY_CARE_PROVIDER_SITE_OTHER): Payer: BC Managed Care – PPO | Admitting: Surgery

## 2011-10-26 VITALS — BP 144/92 | HR 88 | Temp 99.0°F | Resp 18 | Ht 63.0 in | Wt 166.2 lb

## 2011-10-26 DIAGNOSIS — T8189XA Other complications of procedures, not elsewhere classified, initial encounter: Secondary | ICD-10-CM

## 2011-10-26 DIAGNOSIS — J189 Pneumonia, unspecified organism: Secondary | ICD-10-CM

## 2011-10-26 DIAGNOSIS — C50919 Malignant neoplasm of unspecified site of unspecified female breast: Secondary | ICD-10-CM

## 2011-10-26 DIAGNOSIS — C50911 Malignant neoplasm of unspecified site of right female breast: Secondary | ICD-10-CM

## 2011-10-26 HISTORY — DX: Pneumonia, unspecified organism: J18.9

## 2011-10-26 NOTE — Progress Notes (Signed)
Subjective:     Patient ID: Brandi Bates, female   DOB: 10/10/1955, 56 y.o.   MRN: [id number]  HPI  She is here for followup of her left chest wound as well as a recent biopsy of the right breast. She is doing well from the chest wound standpoint. Unfortunately, it is been confirmed a biopsy of the right breast is invasive cancer. Review of Systems     Objective:   Physical Exam On exam, the left chest wall site is healing very well now. I did replace of xenograft however on the wound. I then dressed with dry gauze and Tegaderm.  The pathology report does show invasive mammary carcinoma.    Assessment:     Patient with new right breast cancer as well as nonhealing left  chest wall wound    Plan:     At this point, I had a long discussion with the patient and her family in the room. She was very upset at that time. She reports that she is very upset with me in that she requested mastectomy right breast multiple times the previous year when undergoing a mastectomy for the left breast. I do not recall this conversations at this point. I do understand her feelings. We will be discussing her at tumor conference this week. She was put proceed with mastectomy and sentinel node biopsy. She is going to side with a small Smead do this or transfer care to one of my partners.

## 2011-10-28 ENCOUNTER — Other Ambulatory Visit: Payer: BC Managed Care – PPO

## 2011-10-28 ENCOUNTER — Ambulatory Visit (HOSPITAL_BASED_OUTPATIENT_CLINIC_OR_DEPARTMENT_OTHER): Payer: BC Managed Care – PPO | Admitting: Oncology

## 2011-10-28 ENCOUNTER — Telehealth: Payer: Self-pay | Admitting: *Deleted

## 2011-10-28 VITALS — BP 154/86 | HR 94 | Temp 98.7°F | Ht 63.0 in | Wt 166.2 lb

## 2011-10-28 DIAGNOSIS — C50919 Malignant neoplasm of unspecified site of unspecified female breast: Secondary | ICD-10-CM

## 2011-10-28 DIAGNOSIS — Z170 Estrogen receptor positive status [ER+]: Secondary | ICD-10-CM

## 2011-10-28 NOTE — Telephone Encounter (Signed)
gave patient appointment for 12-08-2011 at 4:30 pm  printed out calendar and gave to the patient

## 2011-10-28 NOTE — Progress Notes (Signed)
ID: Brandi Bates   DOB: 08-Jun-1956  MR#: [id number]  CSN#:[id number]  HISTORY OF PRESENT ILLNESS: The patient felt a mass in her left axilla several months ago.  She did not bring this to her physician's attention because she wasn't sure it really meant anything, but she asked her daughter, and her daughter asked a nurse fried, and they told her she better get on and have this evaluated, so she did bring it to [name] attention and he set her up for diagnostic mammography and ultrasonography performed at [facility] on September 22, 2010.  Dr. [name] was able to demonstrate a spiculated density at the 12 o'clock position of the breast with pleomorphic calcifications measuring about 4 cm.  There was a 2nd focal rounded area of increased density and with the axilla, there was an ovoid mass measuring up to 3.8 cm. Ultrasound showed the area of heterogeneous decreased echogenicity as well as the 2nd discrete focus.  The right breast showed only a cystic mass measuring 1 cm which was of no consequence.  Biopsy was performed of the left breast mass only on the same day.  The pathology report ([id number]) biopsied 2 masses in the left breast.  The second mass was "located more superiorly" and was also identified and biopsied.  The pathology report labels both masses "at 12 o'clock" so we will have to clarify this issue.  At any rate, both masses were Grade 3 invasive ductal carcinoma.  The first one was estrogen receptor positive at 61% but progesterone receptor negative with a proliferation marker of 95%.  The second one was ER positive at 98% and PR "positive" at 3%, also with a proliferation marker of 95%.  Both showed no HER2 amplification.  With this information, the patient was referred for breast MRI.  This was performed September 30, 2010 and showed the main breast mass to measure 3.8 cm.  The posterior mass measured 6 millimeters. There were some confluent axillary masses to a maximum measurement of 6.8 cm. The  patient underwent definitive surgery 10/23/2011 with results and subsequent treatment as detailed below   INTERVAL HISTORY: Brandi Bates returns today for followup of her breast cancer. Since her last visit here she had mammography at [facility] 10/02/2011, which showed an area of asymmetry measuring approximately 1.1 cm in the lateral aspect of the right breast. Ultrasound showed this to measure 5 mm. Biopsy was obtained 10/06/2011, and showed and invasive carcinoma, most likely ductal. The patient is here today to discuss her treatment options.  REVIEW OF SYSTEMS: The abscess at her left mastectomy scar has almost completely healed over. She is not having any fever or bleeding or other systemic symptoms related to this. She does have a variety of chronic problems including constipation, reflux, and headaches. These have not changed in intensity or frequency. She is very anxious and depressed because of her new diagnosis of contralateral breast cancer. She feels her right breast should have been removed last year when she insisted on a. She has discussed this extensively with Dr. Ninfa Linden and she has considered changing surgeons, although she admits she likes and respects Dr. Rush Farmer and appreciates the fact that he apologize for not having listened to her as well as he could have, in her view, last year  PAST MEDICAL HISTORY: Past Medical History  Diagnosis Date  . Migraines   . Numbness of feet   . GERD (gastroesophageal reflux disease)   . Chronic low back pain   . Eczema   .  Lymphedema of arm     left  . History of radiation therapy 04/23/11 thru 06/08/11    L breast  . Breast wound   . Breast cancer 10/23/10    s/p L mastectomy, chemo/radiation, er/pr +, Her2 -  Significant for history of migraines, history of GERD, history of chronic constipation, history of chronic low back pain, history of tobacco abuse, the patient quitting in 1998 with approximately a 50 pack-year history prior to that.  The  patient is status post tubal ligation, status post simple hysterectomy without salpingo-oophorectomy and status post cholecystectomy.  PAST SURGICAL HISTORY: Past Surgical History  Procedure Date  . Tubal ligation   . Abdominal hysterectomy   . Gallbladder surgery   . 10/24/10 left modified radical mastectomy   . Breast surgery   . Irrigation and debridement abscess 06/16/2011    Procedure: IRRIGATION AND DEBRIDEMENT ABSCESS;  Surgeon: Shelly Rubenstein, MD;  Location: [facility];  Service: General;  Laterality: Left;  incision and drainage of left chest wall abcess  . Cholecystectomy     FAMILY HISTORY Family History  Problem Relation Age of Onset  . Cancer Mother     lung  . Hypertension Mother   . Cancer Father     lung  . Cancer Brother     BRAIN CANCER  . Hypertension Brother   . Cancer Cousin      2 PATERNAL COUSINS - BREAST CA  . Hypertension Sister   . Hypertension Maternal Uncle   . Hypertension Maternal Grandmother   The patient's father died at the age of 34 and the patient's mother at the age of 26.  Both were smokers and both had lung cancer. The patient had one brother who died at age 36 with glioblastoma multiforme.  She has one surviving brother, one surviving sister who is present today, and a half-brother.  There are two cousins, both on the father's side, who had breast cancer in their 64s.  There is one uncle with Doreatha Martin disease.  GYNECOLOGIC HISTORY: She is GX P1.  First pregnancy to term at age 78.  She never took hormone replacement.    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  Her husband, [name], is a Therapist, music for [employer].  Daughter [name] works at the same office as her mother.  Son [name] works for [employer].  The patient has 7 grandchildren. She attends a [employer].     ADVANCED DIRECTIVES:  HEALTH MAINTENANCE: History  Substance Use  Topics  . Smoking status: Former Smoker    Quit date: 05/31/1997  . Smokeless tobacco: Never Used  . Alcohol Use: No     Colonoscopy:  PAP: s/p hysterectomy  Bone density: SOLIS October 2012, T - 1.4 at [facility]  Lipid panel:  Allergies  Allergen Reactions  . Morphine And Related Hives and Itching    All over the body  . Prilosec Otc Nausea Only  . Gabapentin Other (See Comments)    Unable to sleep  . Tylox Nausea And Vomiting  . Latex Rash    Only where touched    Current Outpatient Prescriptions  Medication Sig Dispense Refill  . B Complex-C (B-COMPLEX WITH VITAMIN C) tablet Take 1 tablet by mouth daily.        . IBUPROFEN IB PO Take 3 tablets by mouth every 8 (eight) hours as  needed. For pain/fever      . LYSINE PO Take by mouth.      . oxyCODONE-acetaminophen (PERCOCET) 5-325 MG per tablet Ad lib.      . polyethylene glycol (MIRALAX / GLYCOLAX) packet as needed.      . tamoxifen (NOLVADEX) 20 MG tablet daily.      . clonazePAM (KLONOPIN) 0.5 MG tablet Take 1 tablet (0.5 mg total) by mouth at bedtime as needed for anxiety.  30 tablet  3  . DISCONTD: gabapentin (NEURONTIN) 300 MG capsule daily.        OBJECTIVE: Middle-aged white woman who appears anxious Filed Vitals:   10/28/11 1100  BP: 154/86  Pulse: 94  Temp: 98.7 F (37.1 C)     Body mass index is 29.44 kg/(m^2).    ECOG FS: 1  Sclerae unicteric Oropharynx clear No peripheral adenopathy, and specifically there is no palpable right axillary adenopathy Lungs no rales or rhonchi Heart regular rate and rhythm Abd benign MSK no focal spinal tenderness, no peripheral edema Neuro: nonfocal Breasts: I do not palpate any mass in the right breast, there is no skin change and no nipple abnormality. The left breast is status post mastectomy. There is no evidence of local recurrence. The area of ulceration is almost completely healed over  LAB RESULTS: Lab Results  Component Value Date   WBC 4.6 09/10/2011   NEUTROABS  3.4 09/10/2011   HGB 12.9 09/10/2011   HCT 37.2 09/10/2011   MCV 84.7 09/10/2011   PLT 178 09/10/2011      Chemistry      Component Value Date/Time   NA 142 09/10/2011 1328   K 3.7 09/10/2011 1328   CL 106 09/10/2011 1328   CO2 25 09/10/2011 1328   BUN 14 09/10/2011 1328   CREATININE 0.71 09/10/2011 1328      Component Value Date/Time   CALCIUM 9.2 09/10/2011 1328   ALKPHOS 61 09/10/2011 1328   AST 21 09/10/2011 1328   ALT 27 09/10/2011 1328   BILITOT 0.3 09/10/2011 1328       Lab Results  Component Value Date   LABCA2 22 09/10/2011    No results found for this basename: INR:1;PROTIME:1 in the last 168 hours  No results found for this basename: UACOL:1,UAPR:1,USPG:1,UPH:1,UTP:1,UGL:1,UKET:1,UBIL:1,UHGB:1,UNIT:1,UROB:1,ULEU:1,UEPI:1,UWBC:1,URBC:1,UBAC:1,CAST:1,CRYS:1,UCOM:1,BILUA:1 in the last 72 hours   STUDIES: No new results found.  ASSESSMENT: A 56 year old [place] woman with a BRCA mutation of unknown clinical significance  (1) status post left modified radical mastectomy March of 2012 for a T3 N3a (stage IIIC)  invasive ductal carcinoma, grade 3,  which was strongly estrogen receptor positive, progesterone receptor and HER2 negative, with an MIB-1 of 95%.   (2) s/p 4 cycles of adjuvant dose dense doxorubicin and cyclophosphamide, and 11 doses of weekly paclitaxel,   (3) s/p radiation to the left chest and regional nodes completed November 2012, at which time she started tamoxifen  PLAN: She now has what clinically is 8T1A or T1 C. invasive carcinoma, with a prognostic panel pending. She absolutely wants to have a mastectomy this time and that is not unreasonable. She will need sentinel lymph node sampling, and this mandates that her surgery has to be a common hospital. She would like to have her bilateral salpingo-oophorectomy performed at the same time, and this may be difficult for Dr. Philis Pique to schedule.  The patient consider switching surgeons, but she does like Dr.  Ninfa Linden and is going to stick with him. I will as him and Dr. Philis Pique to  schedule her surgery as soon as possible. Jamina is going to call me 2 or 3 days postop to get preliminary results. She will see me again in May to make if final plan regarding adjuvant treatment for this second breast cancer.  Jacqlyn Marolf C    10/28/2011

## 2011-10-29 ENCOUNTER — Other Ambulatory Visit (INDEPENDENT_AMBULATORY_CARE_PROVIDER_SITE_OTHER): Payer: Self-pay | Admitting: Surgery

## 2011-10-29 DIAGNOSIS — C50911 Malignant neoplasm of unspecified site of right female breast: Secondary | ICD-10-CM

## 2011-10-30 ENCOUNTER — Encounter (HOSPITAL_COMMUNITY): Payer: Self-pay | Admitting: Pharmacy Technician

## 2011-11-05 ENCOUNTER — Other Ambulatory Visit: Payer: Self-pay | Admitting: Obstetrics and Gynecology

## 2011-11-05 NOTE — Pre-Procedure Instructions (Signed)
[address] YAMAIRA SPINNER  11/05/2011   Your procedure is scheduled OZ:HYQM, April 16 @ 1:15 PM  Report to [facility] at 10:15 AM.  Call this number if you have problems the morning of surgery: [phone]   Remember:   Do not eat food:After Midnight.  May have clear liquids: up to 4 Hours before arrival.(until 6:15 am)  Clear liquids include soda, tea, black coffee, apple or grape juice, broth,water  Take these medicines the morning of surgery with A SIP OF WATER: Pain Pill(if needed)   Do not wear jewelry, make-up or nail polish.  Do not wear lotions, powders, or perfumes.   Do not shave 48 hours prior to surgery.  Do not bring valuables to the hospital.  Contacts, dentures or bridgework may not be worn into surgery.  Leave suitcase in the car. After surgery it may be brought to your room.  For patients admitted to the hospital, checkout time is 11:00 AM the day of discharge.   Special Instructions: CHG Shower Use Special Wash: 1/2 bottle night before surgery and 1/2 bottle morning of surgery.   Please read over the following fact sheets that you were given: Pain Booklet, Coughing and Deep Breathing, MRSA Information and Surgical Site Infection Prevention

## 2011-11-06 ENCOUNTER — Encounter (HOSPITAL_COMMUNITY): Payer: Self-pay | Admitting: Pharmacy Technician

## 2011-11-06 ENCOUNTER — Encounter (HOSPITAL_COMMUNITY): Payer: Self-pay | Admitting: *Deleted

## 2011-11-06 ENCOUNTER — Encounter (HOSPITAL_COMMUNITY)
Admission: RE | Admit: 2011-11-06 | Discharge: 2011-11-06 | Disposition: A | Discharge status: Home / Self Care | Payer: BC Managed Care – PPO | Source: Ambulatory Visit | Attending: Surgery | Admitting: Surgery

## 2011-11-06 LAB — BASIC METABOLIC PANEL
CO2: 26 mEq/L (ref 19–32)
Calcium: 9.8 mg/dL (ref 8.4–10.5)
Creatinine, Ser: 0.63 mg/dL (ref 0.50–1.10)
GFR calc non Af Amer: >90 mL/min (ref >90)
Glucose, Bld: 99 mg/dL (ref 70–99)

## 2011-11-06 LAB — CBC
MCH: 28.5 pg (ref 26.0–34.0)
MCHC: 33.1 g/dL (ref 30.0–36.0)
MCV: 86.3 fL (ref 78.0–100.0)
Platelets: 173 10*3/uL (ref 150–400)
RDW: 13.8 % (ref 11.5–15.5)

## 2011-11-09 MED ORDER — CEFAZOLIN SODIUM 1-5 GM-% IV SOLN
1.0000 g | INTRAVENOUS | Status: AC
  Administered 2011-11-10: 1 g via INTRAVENOUS
  Filled 2011-11-09: qty 50

## 2011-11-09 NOTE — H&P (Signed)
56 y.o. yo with recurrence of breast Ca desires her ovaries to be removed both to reduce her estrogen burden and also to reduce risk of ovarian cancer secondary to positive BRCA 1 mutation.  Past Medical History  Diagnosis Date  . Numbness of feet   . GERD (gastroesophageal reflux disease)   . Chronic low back pain   . Eczema   . Lymphedema of arm     left  . History of radiation therapy 04/23/11 thru 06/08/11    L breast  . Breast wound   . Breast cancer 10/23/10    s/p L mastectomy, chemo/radiation, er/pr +, Her2 -  . PONV (postoperative nausea and vomiting)   . Migraines   . Neuromuscular disorder     raynauds syndrome    Past Surgical History  Procedure Date  . Tubal ligation   . Abdominal hysterectomy   . Gallbladder surgery   . 10/24/10 left modified radical mastectomy   . Breast surgery   . Irrigation and debridement abscess 06/16/2011    Procedure: IRRIGATION AND DEBRIDEMENT ABSCESS;  Surgeon: Shelly Rubenstein, MD;  Location: [facility];  Service: General;  Laterality: Left;  incision and drainage of left chest wall abcess  . Cholecystectomy     History   Social History  . Marital Status: Married    Spouse Name: N/Brandi Bates    Number of Children: N/Brandi Bates  . Years of Education: N/Brandi Bates   Occupational History  . Not on file.   Social History Main Topics  . Smoking status: Former Smoker    Quit date: 05/31/1997  . Smokeless tobacco: Never Used  . Alcohol Use: No  . Drug Use: No  . Sexually Active: Not on file     P1, 1st pregnancy age 81, no HRT   Other Topics Concern  . Not on file   Social History Narrative  . No narrative on file    No current facility-administered medications on file prior to encounter.   Current Outpatient Prescriptions on File Prior to Encounter  Medication Sig Dispense Refill  . LYSINE PO Take 1 tablet by mouth daily.       Marland Kitchen oxyCODONE-acetaminophen (PERCOCET) 5-325 MG per tablet Take 1 tablet by mouth every 6 (six) hours as needed. For pain       . polyethylene glycol (MIRALAX / GLYCOLAX) packet Take 17 g by mouth daily as needed. For constipation      . DISCONTD: gabapentin (NEURONTIN) 300 MG capsule daily.        Allergies  Allergen Reactions  . Morphine And Related Hives and Itching    All over the body  . Prilosec Otc Nausea Only  . Gabapentin Other (See Comments)    Unable to sleep  . Tylox Nausea And Vomiting  . Latex Rash    Only where touched    Lungs: clear to ascultation Cor:  RRR Abdomen:  soft, nontender, nondistended. Ex:  no cords, erythema Pelvic:  NEFG, normal vagina, possible mass at top of cuff.  U/S  RO 1.5x1.33, LO not seen.  Brandi Bates:  Hx of breast ca, desires BSO via laparscopy.   P:   All risks, benefits and alternatives d/w patient and she desires to proceed with Brandi Bates laparoscopic BSO at the time of mastectomy.           Brandi Brandi Bates

## 2011-11-09 NOTE — Consult Note (Signed)
Anesthesia:  Patient is a 56 year old female scheduled for a right mastectomy and SN biopsy (Dr. Abigail Miyamoto) and laparoscopic BSO (Dr. Frankey Poot) on 11/10/11.  History includes breast CA s/p prior left mastectomy (March 2012) and chemo/radiation, former smoker, GERD, migraines, Raynaud's syndrome, LUE lymphedema.  She is s/p I&D left breast abscess on 06/16/11 under [facility].  She has not documented history of CAD/MI, CHF, or DM.  Pre-operative EKG from 06/16/11 showed NSR, low voltage EKG, anterior T wave abnormality.  The interpreting Cardiologist did not feel that it was significantly changed since September 2004.  Pre-chemo echo on 10/09/10 showed: Left ventricle: The cavity size was normal. Systolic function was normal. The estimated ejection fraction was in the range of 55% to 60%.   CBC, BMET WNL.  CXR on 06/16/11 showed no active disease. Bibasilar atelectasis.   Her EKG appears stable since 2004, no CV symptoms reported at PAT, no known cardiac history, she had a normal EF in March 2012, and tolerated two procedures under [facility] since March 2012.  If remains, asymptomatic then anticipate she can proceed as planned.  Anesthesiologist Dr. Michelle Piper agrees with this plan.

## 2011-11-09 NOTE — H&P (Signed)
Brandi Bates is an 56 y.o. female.   Chief Complaint: right breast cancer HPI: patient with history of left breast cancer s/p mastectomy and axillary dissection, post op chemo and XRT for stage III cancer last year now with new cancer in right breast.  Past Medical History  Diagnosis Date  . Numbness of feet   . GERD (gastroesophageal reflux disease)   . Chronic low back pain   . Eczema   . Lymphedema of arm     left  . History of radiation therapy 04/23/11 thru 06/08/11    L breast  . Breast wound   . Breast cancer 10/23/10    s/p L mastectomy, chemo/radiation, er/pr +, Her2 -  . PONV (postoperative nausea and vomiting)   . Migraines   . Neuromuscular disorder     raynauds syndrome     Past Surgical History  Procedure Date  . Tubal ligation   . Abdominal hysterectomy   . Gallbladder surgery   . 10/24/10 left modified radical mastectomy   . Breast surgery   . Irrigation and debridement abscess 06/16/2011    Procedure: IRRIGATION AND DEBRIDEMENT ABSCESS;  Surgeon: Brandi Rubenstein, MD;  Location: [facility];  Service: General;  Laterality: Left;  incision and drainage of left chest wall abcess  . Cholecystectomy     Family History  Problem Relation Age of Onset  . Cancer Mother     lung  . Hypertension Mother   . Cancer Father     lung  . Cancer Brother     BRAIN CANCER  . Hypertension Brother   . Cancer Cousin      2 PATERNAL COUSINS - BREAST CA  . Hypertension Sister   . Hypertension Maternal Uncle   . Hypertension Maternal Grandmother    Social History:  reports that she quit smoking about 14 years ago. She has never used smokeless tobacco. She reports that she does not drink alcohol or use illicit drugs.  Allergies:  Allergies  Allergen Reactions  . Morphine And Related Hives and Itching    All over the body  . Prilosec Otc Nausea Only  . Gabapentin Other (See Comments)    Unable to sleep  . Tylox Nausea And Vomiting  . Latex Rash    Only where touched     Medications Prior to Admission  Medication Dose Route Frequency Provider Last Rate Last Dose  . ceFAZolin (ANCEF) IVPB 1 g/50 mL premix  1 g Intravenous 60 min Pre-Op Brandi Rubenstein, MD       Medications Prior to Admission  Medication Sig Dispense Refill  . clonazePAM (KLONOPIN) 0.5 MG tablet Take 0.5 mg by mouth at bedtime as needed. For anxiety      . ibuprofen (ADVIL,MOTRIN) 200 MG tablet Take 600 mg by mouth every 8 (eight) hours as needed. For pain      . LYSINE PO Take 1 tablet by mouth daily.       Marland Kitchen oxyCODONE-acetaminophen (PERCOCET) 5-325 MG per tablet Take 1 tablet by mouth every 6 (six) hours as needed. For pain      . polyethylene glycol (MIRALAX / GLYCOLAX) packet Take 17 g by mouth daily as needed. For constipation        No results found for this or any previous visit (from the past 48 hour(s)). No results found.  Review of Systems  All other systems reviewed and are negative.    There were no vitals taken for this visit. Physical  Exam  Constitutional: She is oriented to person, place, and time. She appears well-developed and well-nourished. No distress.  HENT:  Head: Normocephalic and atraumatic.  Right Ear: External ear normal.  Left Ear: External ear normal.  Nose: Nose normal.  Mouth/Throat: Oropharynx is clear and moist.  Eyes: Conjunctivae are normal. Pupils are equal, round, and reactive to light. No scleral icterus.  Neck: Normal range of motion. Neck supple.  Cardiovascular: Normal rate, regular rhythm, normal heart sounds and intact distal pulses.   Respiratory: Effort normal and breath sounds normal.       Left mastectomy  GI: Soft. Bowel sounds are normal. She exhibits no distension. There is no tenderness.  Musculoskeletal: Normal range of motion. She exhibits no edema and no tenderness.  Neurological: She is alert and oriented to person, place, and time.  Skin: Skin is warm and dry. No rash noted. No erythema.  Psychiatric: Her behavior is  normal. Judgment normal.     Assessment/Plan Invasive Right Breast Cancer  At patient request, I am proceeding with right mastectomy and sentinel lymph node biopsy.  Risks of surgery including, but not limited to bleeding, infection, injury, need for further surgery, etc discussed with patient.  Likelihood of success in good.  Dr. Henderson Bates is planning a laparoscopic oopherectomy at same proceedure  Brandi Bates A 11/09/2011, 8:19 PM

## 2011-11-10 ENCOUNTER — Encounter (HOSPITAL_COMMUNITY): Payer: Self-pay | Admitting: Vascular Surgery

## 2011-11-10 ENCOUNTER — Encounter (HOSPITAL_COMMUNITY)
Admission: RE | Disposition: A | Discharge status: Home / Self Care | Payer: Self-pay | Source: Ambulatory Visit | Attending: Surgery

## 2011-11-10 ENCOUNTER — Encounter (HOSPITAL_COMMUNITY): Payer: Self-pay | Admitting: General Practice

## 2011-11-10 ENCOUNTER — Ambulatory Visit (HOSPITAL_COMMUNITY)
Admission: RE | Admit: 2011-11-10 | Discharge: 2011-11-10 | Disposition: A | Discharge status: Home / Self Care | Payer: BC Managed Care – PPO | Source: Ambulatory Visit | Attending: Surgery | Admitting: Surgery

## 2011-11-10 ENCOUNTER — Ambulatory Visit (HOSPITAL_COMMUNITY)
Admission: RE | Admit: 2011-11-10 | Discharge: 2011-11-12 | DRG: 258 | Disposition: A | Discharge status: Home / Self Care | Payer: BC Managed Care – PPO | Source: Ambulatory Visit | Attending: Surgery | Admitting: Surgery

## 2011-11-10 ENCOUNTER — Ambulatory Visit (HOSPITAL_COMMUNITY): Payer: BC Managed Care – PPO | Admitting: Vascular Surgery

## 2011-11-10 ENCOUNTER — Encounter (HOSPITAL_COMMUNITY): Payer: Self-pay | Admitting: *Deleted

## 2011-11-10 DIAGNOSIS — R51 Headache: Secondary | ICD-10-CM | POA: Insufficient documentation

## 2011-11-10 DIAGNOSIS — C50911 Malignant neoplasm of unspecified site of right female breast: Secondary | ICD-10-CM

## 2011-11-10 DIAGNOSIS — C50919 Malignant neoplasm of unspecified site of unspecified female breast: Secondary | ICD-10-CM | POA: Insufficient documentation

## 2011-11-10 DIAGNOSIS — K219 Gastro-esophageal reflux disease without esophagitis: Secondary | ICD-10-CM | POA: Insufficient documentation

## 2011-11-10 DIAGNOSIS — Z9010 Acquired absence of unspecified breast and nipple: Secondary | ICD-10-CM | POA: Insufficient documentation

## 2011-11-10 DIAGNOSIS — Z01812 Encounter for preprocedural laboratory examination: Secondary | ICD-10-CM | POA: Insufficient documentation

## 2011-11-10 DIAGNOSIS — Z4002 Encounter for prophylactic removal of ovary: Secondary | ICD-10-CM | POA: Insufficient documentation

## 2011-11-10 DIAGNOSIS — Z853 Personal history of malignant neoplasm of breast: Secondary | ICD-10-CM | POA: Insufficient documentation

## 2011-11-10 HISTORY — PX: MASTECTOMY W/ SENTINEL NODE BIOPSY: SHX2001

## 2011-11-10 HISTORY — DX: Anxiety disorder, unspecified: F41.9

## 2011-11-10 HISTORY — DX: Shortness of breath: R06.02

## 2011-11-10 HISTORY — PX: MASTECTOMY: SHX3

## 2011-11-10 HISTORY — PX: BILATERAL SALPINGOOPHORECTOMY: SHX1223

## 2011-11-10 SURGERY — MASTECTOMY WITH SENTINEL LYMPH NODE BIOPSY
Anesthesia: General | Site: Breast | Laterality: Right | Wound class: Clean

## 2011-11-10 MED ORDER — POTASSIUM CHLORIDE IN NACL 20-0.9 MEQ/L-% IV SOLN
INTRAVENOUS | Status: DC
  Administered 2011-11-11 (×2): via INTRAVENOUS
  Filled 2011-11-10 (×4): qty 1000

## 2011-11-10 MED ORDER — HYDROMORPHONE 0.3 MG/ML IV SOLN
INTRAVENOUS | Status: DC
  Administered 2011-11-10: 0.9 mg via INTRAVENOUS
  Administered 2011-11-10: 17:00:00 via INTRAVENOUS
  Administered 2011-11-11: 0.9 mg via INTRAVENOUS
  Administered 2011-11-11: 0.3 mg via INTRAVENOUS
  Administered 2011-11-11 (×2): 1.5 mg via INTRAVENOUS
  Administered 2011-11-11: 2.1 mg via INTRAVENOUS
  Administered 2011-11-12: 1.2 mg via INTRAVENOUS

## 2011-11-10 MED ORDER — POTASSIUM CHLORIDE IN NACL 20-0.9 MEQ/L-% IV SOLN
INTRAVENOUS | Status: AC
  Administered 2011-11-10: 75 mL/h via INTRAVENOUS
  Filled 2011-11-10: qty 1000

## 2011-11-10 MED ORDER — NALOXONE HCL 0.4 MG/ML IJ SOLN: 0.4000 mg | INTRAMUSCULAR | Status: DC | PRN

## 2011-11-10 MED ORDER — ONDANSETRON HCL 4 MG/2ML IJ SOLN
INTRAMUSCULAR | Status: DC | PRN
  Administered 2011-11-10: 4 mg via INTRAVENOUS

## 2011-11-10 MED ORDER — MIDAZOLAM HCL 2 MG/2ML IJ SOLN
INTRAMUSCULAR | Status: AC
  Filled 2011-11-10: qty 2

## 2011-11-10 MED ORDER — TECHNETIUM TC 99M SULFUR COLLOID FILTERED
1.0000 | Freq: Once | INTRAVENOUS | Status: AC | PRN
  Administered 2011-11-10: 1 via INTRADERMAL

## 2011-11-10 MED ORDER — DIPHENHYDRAMINE HCL 50 MG/ML IJ SOLN: 12.5000 mg | Freq: Four times a day (QID) | INTRAMUSCULAR | Status: DC | PRN

## 2011-11-10 MED ORDER — ROCURONIUM BROMIDE 100 MG/10ML IV SOLN
INTRAVENOUS | Status: DC | PRN
  Administered 2011-11-10: 50 mg via INTRAVENOUS

## 2011-11-10 MED ORDER — PROPOFOL 10 MG/ML IV EMUL
INTRAVENOUS | Status: DC | PRN
  Administered 2011-11-10: 150 mg via INTRAVENOUS

## 2011-11-10 MED ORDER — SODIUM CHLORIDE 0.9 % IJ SOLN
INTRAMUSCULAR | Status: DC | PRN
  Administered 2011-11-10: 15:00:00

## 2011-11-10 MED ORDER — NEOSTIGMINE METHYLSULFATE 1 MG/ML IJ SOLN
INTRAMUSCULAR | Status: DC | PRN
  Administered 2011-11-10: 4 mg via INTRAVENOUS

## 2011-11-10 MED ORDER — 0.9 % SODIUM CHLORIDE (POUR BTL) OPTIME
TOPICAL | Status: DC | PRN
  Administered 2011-11-10: 1000 mL

## 2011-11-10 MED ORDER — ENOXAPARIN SODIUM 40 MG/0.4ML ~~LOC~~ SOLN
40.0000 mg | SUBCUTANEOUS | Status: DC
  Administered 2011-11-11: 40 mg via SUBCUTANEOUS
  Filled 2011-11-10 (×2): qty 0.4

## 2011-11-10 MED ORDER — ONDANSETRON HCL 4 MG PO TABS: 4.0000 mg | ORAL_TABLET | Freq: Four times a day (QID) | ORAL | Status: DC | PRN

## 2011-11-10 MED ORDER — KETOROLAC TROMETHAMINE 15 MG/ML IJ SOLN
15.0000 mg | Freq: Four times a day (QID) | INTRAMUSCULAR | Status: AC
  Administered 2011-11-10 – 2011-11-11 (×5): 15 mg via INTRAVENOUS
  Filled 2011-11-10 (×4): qty 1

## 2011-11-10 MED ORDER — HYDROMORPHONE HCL PF 1 MG/ML IJ SOLN
INTRAMUSCULAR | Status: AC
  Filled 2011-11-10: qty 1

## 2011-11-10 MED ORDER — MIDAZOLAM HCL 2 MG/2ML IJ SOLN
1.0000 mg | INTRAMUSCULAR | Status: DC | PRN
  Administered 2011-11-10 (×2): 1 mg via INTRAVENOUS

## 2011-11-10 MED ORDER — SCOPOLAMINE 1 MG/3DAYS TD PT72
1.0000 | MEDICATED_PATCH | TRANSDERMAL | Status: DC
  Administered 2011-11-10: 1 via TRANSDERMAL
  Administered 2011-11-10: 1.5 mg via TRANSDERMAL

## 2011-11-10 MED ORDER — LACTATED RINGERS IV SOLN
INTRAVENOUS | Status: DC | PRN
  Administered 2011-11-10 (×2): via INTRAVENOUS

## 2011-11-10 MED ORDER — KETOROLAC TROMETHAMINE 30 MG/ML IJ SOLN
INTRAMUSCULAR | Status: AC
  Administered 2011-11-10: 15 mg
  Filled 2011-11-10: qty 1

## 2011-11-10 MED ORDER — FENTANYL CITRATE 0.05 MG/ML IJ SOLN
50.0000 ug | INTRAMUSCULAR | Status: DC | PRN
  Administered 2011-11-10: 100 ug via INTRAVENOUS

## 2011-11-10 MED ORDER — CLONAZEPAM 0.5 MG PO TABS: 0.5000 mg | ORAL_TABLET | Freq: Every evening | ORAL | Status: DC | PRN

## 2011-11-10 MED ORDER — OXYCODONE-ACETAMINOPHEN 5-325 MG PO TABS: 1.0000 | ORAL_TABLET | ORAL | Status: DC | PRN

## 2011-11-10 MED ORDER — SODIUM CHLORIDE 0.9 % IJ SOLN: 9.0000 mL | INTRAMUSCULAR | Status: DC | PRN

## 2011-11-10 MED ORDER — MIDAZOLAM HCL 5 MG/5ML IJ SOLN
INTRAMUSCULAR | Status: DC | PRN
  Administered 2011-11-10 (×2): 2 mg via INTRAVENOUS

## 2011-11-10 MED ORDER — HYDROMORPHONE HCL PF 1 MG/ML IJ SOLN
0.2500 mg | INTRAMUSCULAR | Status: DC | PRN
  Administered 2011-11-10 (×4): 0.5 mg via INTRAVENOUS

## 2011-11-10 MED ORDER — DIPHENHYDRAMINE HCL 12.5 MG/5ML PO ELIX
12.5000 mg | ORAL_SOLUTION | Freq: Four times a day (QID) | ORAL | Status: DC | PRN
  Filled 2011-11-10: qty 5

## 2011-11-10 MED ORDER — ONDANSETRON HCL 4 MG/2ML IJ SOLN: 4.0000 mg | Freq: Four times a day (QID) | INTRAMUSCULAR | Status: DC | PRN

## 2011-11-10 MED ORDER — MORPHINE SULFATE 2 MG/ML IJ SOLN: 0.0500 mg/kg | INTRAMUSCULAR | Status: DC | PRN

## 2011-11-10 MED ORDER — GLYCOPYRROLATE 0.2 MG/ML IJ SOLN
INTRAMUSCULAR | Status: DC | PRN
  Administered 2011-11-10: .5 mg via INTRAVENOUS

## 2011-11-10 MED ORDER — HYDROMORPHONE 0.3 MG/ML IV SOLN
INTRAVENOUS | Status: AC
  Filled 2011-11-10: qty 25

## 2011-11-10 MED ORDER — FENTANYL CITRATE 0.05 MG/ML IJ SOLN
INTRAMUSCULAR | Status: DC | PRN
  Administered 2011-11-10: 50 ug via INTRAVENOUS
  Administered 2011-11-10 (×2): 100 ug via INTRAVENOUS
  Administered 2011-11-10: 250 ug via INTRAVENOUS

## 2011-11-10 MED ORDER — FENTANYL CITRATE 0.05 MG/ML IJ SOLN
INTRAMUSCULAR | Status: AC
  Filled 2011-11-10: qty 2

## 2011-11-10 SURGICAL SUPPLY — 67 items
ADH SKN CLS LQ APL DERMABOND (GAUZE/BANDAGES/DRESSINGS) ×2
APL SKNCLS STERI-STRIP NONHPOA (GAUZE/BANDAGES/DRESSINGS) ×2
APPLIER CLIP 9.375 MED OPEN (MISCELLANEOUS) ×3
APR CLP MED 9.3 20 MLT OPN (MISCELLANEOUS) ×2
BAG SPEC RTRVL LRG 6X4 10 (ENDOMECHANICALS) ×6
BENZOIN TINCTURE PRP APPL 2/3 (GAUZE/BANDAGES/DRESSINGS) ×3 IMPLANT
BINDER BREAST LRG (GAUZE/BANDAGES/DRESSINGS) IMPLANT
BINDER BREAST XLRG (GAUZE/BANDAGES/DRESSINGS) ×1 IMPLANT
CANISTER SUCTION 2500CC (MISCELLANEOUS) ×6 IMPLANT
CATH ROBINSON RED A/P 14FR (CATHETERS) ×3 IMPLANT
CHLORAPREP W/TINT 26ML (MISCELLANEOUS) ×3 IMPLANT
CLIP APPLIE 9.375 MED OPEN (MISCELLANEOUS) ×2 IMPLANT
CLOTH BEACON ORANGE TIMEOUT ST (SAFETY) ×6 IMPLANT
CLSR STERI-STRIP ANTIMIC 1/2X4 (GAUZE/BANDAGES/DRESSINGS) ×1 IMPLANT
CONT SPEC STER OR (MISCELLANEOUS) ×4 IMPLANT
COVER PROBE W GEL 5X96 (DRAPES) ×3 IMPLANT
COVER SURGICAL LIGHT HANDLE (MISCELLANEOUS) ×6 IMPLANT
DERMABOND ADHESIVE PROPEN (GAUZE/BANDAGES/DRESSINGS) ×1
DERMABOND ADVANCED .7 DNX6 (GAUZE/BANDAGES/DRESSINGS) IMPLANT
DRAIN CHANNEL 19F RND (DRAIN) ×3 IMPLANT
DRAPE LAPAROSCOPIC ABDOMINAL (DRAPES) ×3 IMPLANT
DRAPE PROXIMA HALF (DRAPES) ×1 IMPLANT
ELECT REM PT RETURN 9FT ADLT (ELECTROSURGICAL) ×3
ELECTRODE REM PT RTRN 9FT ADLT (ELECTROSURGICAL) ×2 IMPLANT
EVACUATOR SILICONE 100CC (DRAIN) ×3 IMPLANT
GAUZE SPONGE 4X4 16PLY XRAY LF (GAUZE/BANDAGES/DRESSINGS) ×3 IMPLANT
GLOVE SURG SIGNA 7.5 PF LTX (GLOVE) ×3 IMPLANT
GLOVE SURG SS PI 7.0 STRL IVOR (GLOVE) ×3 IMPLANT
GLOVE SURG SS PI 7.5 STRL IVOR (GLOVE) ×3 IMPLANT
GOWN PREVENTION PLUS XLARGE (GOWN DISPOSABLE) ×3 IMPLANT
GOWN STRL NON-REIN LRG LVL3 (GOWN DISPOSABLE) ×9 IMPLANT
KIT BASIN OR (CUSTOM PROCEDURE TRAY) ×6 IMPLANT
KIT ROOM TURNOVER OR (KITS) ×3 IMPLANT
LIGASURE 5MM LAPAROSCOPIC (INSTRUMENTS) ×3 IMPLANT
NDL 18GX1X1/2 (RX/OR ONLY) (NEEDLE) ×2 IMPLANT
NDL HYPO 25GX1X1/2 BEV (NEEDLE) ×2 IMPLANT
NDL INSUFFLATION 14GA 120MM (NEEDLE) ×2 IMPLANT
NEEDLE 18GX1X1/2 (RX/OR ONLY) (NEEDLE) ×3 IMPLANT
NEEDLE HYPO 25GX1X1/2 BEV (NEEDLE) ×3 IMPLANT
NEEDLE INSUFFLATION 14GA 120MM (NEEDLE) ×3 IMPLANT
NS IRRIG 1000ML POUR BTL (IV SOLUTION) ×3 IMPLANT
PACK GENERAL/GYN (CUSTOM PROCEDURE TRAY) ×3 IMPLANT
PACK LAPAROSCOPIC ABD 0248 (SET/KITS/TRAYS/PACK) ×3 IMPLANT
PAD ARMBOARD 7.5X6 YLW CONV (MISCELLANEOUS) ×6 IMPLANT
POUCH SPECIMEN RETRIEVAL 10MM (ENDOMECHANICALS) ×9 IMPLANT
SHEET LAVH (DRAPES) ×1 IMPLANT
SLEEVE ENDOPATH XCEL 5M (ENDOMECHANICALS) ×6 IMPLANT
SPECIMEN JAR MEDIUM (MISCELLANEOUS) ×2 IMPLANT
SPECIMEN JAR X LARGE (MISCELLANEOUS) ×3 IMPLANT
SPONGE GAUZE 4X4 12PLY (GAUZE/BANDAGES/DRESSINGS) ×3 IMPLANT
STAPLER VISISTAT 35W (STAPLE) ×3 IMPLANT
STRIP CLOSURE SKIN 1/2X4 (GAUZE/BANDAGES/DRESSINGS) ×4 IMPLANT
SUT ETHILON 3 0 FSL (SUTURE) ×3 IMPLANT
SUT MON AB 4-0 PC3 18 (SUTURE) ×3 IMPLANT
SUT SILK 2 0 SH (SUTURE) IMPLANT
SUT VIC AB 2-0 UR6 27 (SUTURE) ×1 IMPLANT
SUT VIC AB 3-0 SH 27 (SUTURE) ×9
SUT VIC AB 3-0 SH 27X BRD (SUTURE) IMPLANT
SUT VIC AB 3-0 SH 27XBRD (SUTURE) ×2 IMPLANT
SUT VICRYL RAPIDE 4/0 PS 2 (SUTURE) ×1 IMPLANT
SYR CONTROL 10ML LL (SYRINGE) ×3 IMPLANT
TOWEL OR 17X24 6PK STRL BLUE (TOWEL DISPOSABLE) ×3 IMPLANT
TOWEL OR 17X26 10 PK STRL BLUE (TOWEL DISPOSABLE) ×6 IMPLANT
TOWEL OR NON WOVEN STRL DISP B (DISPOSABLE) ×1 IMPLANT
TROCAR XCEL 12X100 BLDLESS (ENDOMECHANICALS) ×3 IMPLANT
TROCAR XCEL NON-BLD 5MMX100MML (ENDOMECHANICALS) ×3 IMPLANT
WATER STERILE IRR 1000ML POUR (IV SOLUTION) IMPLANT

## 2011-11-10 NOTE — Interval H&P Note (Signed)
History and Physical Interval Note:  She has had no change in her history or exam  11/10/2011 11:07 AM  Brandi Bates  has presented today for surgery, with the diagnosis of Right breast cancer  The various methods of treatment have been discussed with the patient and family. After consideration of risks, benefits and other options for treatment, the patient has consented to  Procedure(s) (LRB): MASTECTOMY WITH SENTINEL LYMPH NODE BIOPSY (Right) LAPAROSCOPIC BILATERAL SALPINGO OOPHERECTOMY (Bilateral) as a surgical intervention .  The patients' history has been reviewed, patient examined, no change in status, stable for surgery.  I have reviewed the patients' chart and labs.  Questions were answered to the patient's satisfaction.     Ibrahim Mcpheeters A

## 2011-11-10 NOTE — Anesthesia Procedure Notes (Signed)
Date/Time: 11/10/2011 2:22 PM Performed by: Mancel Parsons Pre-anesthesia Checklist: Patient identified, Emergency Drugs available, Suction available, Patient being monitored and Timeout performed Patient Re-evaluated:Patient Re-evaluated prior to inductionOxygen Delivery Method: Circle system utilized Preoxygenation: Pre-oxygenation with 100% oxygen Intubation Type: IV induction Ventilation: Mask ventilation without difficulty Laryngoscope Size: Mac and 3 Grade View: Grade I Tube type: Oral Tube size: 7.5 mm Number of attempts: 1 Airway Equipment and Method: Stylet Placement Confirmation: ETT inserted through vocal cords under direct vision,  positive ETCO2 and breath sounds checked- equal and bilateral Secured at: 22 cm Tube secured with: Tape Dental Injury: Teeth and Oropharynx as per pre-operative assessment

## 2011-11-10 NOTE — Preoperative (Signed)
Beta Blockers   Reason not to administer Beta Blockers:Not Applicable 

## 2011-11-10 NOTE — Op Note (Signed)
MASTECTOMY WITH SENTINEL LYMPH NODE BIOPSY  Procedure Note  Brandi Bates 11/10/2011   Pre-op Diagnosis: Right breast cancer     Post-op Diagnosis: same  Procedure(s): MASTECTOMY WITH SENTINEL LYMPH NODE BIOPSY RIGHT BREAST LAPAROSCOPIC BILATERAL SALPINGO OOPHERECTOMY  Surgeon(s): Shelly Rubenstein, MD Shelly Rubenstein, MD Loney Laurence, MD  Anesthesia: General  Staff:  Gerre Pebbles Sipsis, RN - Circulator Janeece Agee Pingue, CST - Scrub Person Megan Day [place], RN - Circulator Assistant Del Muerto Eulogio Ditch, CST - Relief Scrub Shelia Media, RN - Circulator Doristine Section Dimattia, RN - Circulator  Estimated Blood Loss: Minimal               Specimens: RIGHT BREAST AND SENTINEL LYMPH NODE X 4  Indications: This is a 56 year old female who has already had a left mastectomy and axillary node dissection with chemotherapy and radiation therapy for breast cancer.unfortunately, she has been found to have a new breast cancer in the right breast. The decision has been made to proceed with mastectomy and sentinel node biopsy.  Procedure: The patient was already in the operating room and had radioactive isotope injected into the right breast preoperatively. She had already finished undergoing her laparoscopic salpingo-oophorectomy by the gynecologist. I then injected blue dye underneath the areola and nipple of the right breast. I then massaged the breast. Her right breast and chest were prepped and draped in the sterile fashion. An elliptical incision around the areola of the right breast going from the sternum out laterally. I then examined the breast tissue with electrocautery. I first performed the superior skin flap staying close to the skin and subcutaneous tissue. I took this all the way up to near the level of her Port-A-Cath. I then taken out of the chest wall. Likewise, I created the lower flap with electrocautery going down to the inframammary ridge. I then  dissected the breast tissue the fascia of the pectoralis muscle medial to laterally with electrocautery. As I reached the axilla, the neoprobe was brought to the field. Identified for lymph nodes in the axilla that had uptake of radioactivity and blue dye. These were all completely removed electrocautery and sent separately to pathology for evaluation. Again examine the axilla with the neoprobe and no further increased uptake was identified. I then thoroughly irrigated the chest wall and axilla with normal saline. Hemostasis appeared achieved. A transverse skin incision placed a 19 [place] Blake drain into the axilla and chest wall. I sewed this in place with a nylon suture. I then closed subcutaneous tissue with interrupted 3-0 Vicryl sutures and then closed the skin with a running 4-0 Monocryl suture. Steri-Strips, gauze, and bandage applied. The patient tolerated the procedure well. The breast specimen likewise sent to pathology for evaluation. All the counts were correct in the procedure. The patient was then extubated in the operating room and taken in stable condition to the recovery room.           Brandi Bates A   Date: 11/10/2011  Time: 4:26 PM

## 2011-11-10 NOTE — Anesthesia Preprocedure Evaluation (Addendum)
Anesthesia Evaluation  Patient identified by MRN, date of birth, ID band Patient awake    Reviewed: Allergy & Precautions, H&P , NPO status , Patient's Chart, lab work & pertinent test results  History of Anesthesia Complications (+) PONV  Airway Mallampati: II TM Distance: >3 FB Neck ROM: Full    Dental  (+) Partial Upper and Dental Advisory Given   Pulmonary former smoker   Pulmonary exam normal       Cardiovascular negative cardio ROS  Rhythm:Regular Rate:Normal  Echo 3/12: normal LVF, EF 55-60%, normal valves   Neuro/Psych  Headaches, Chemo for breast cancer- peripheral neuropathy negative psych ROS   GI/Hepatic Neg liver ROS, GERD-  Poorly Controlled,  Endo/Other  Morbid obesity  Renal/GU negative Renal ROS  negative genitourinary   Musculoskeletal negative musculoskeletal ROS (+)   Abdominal (+) + obese,   Peds  Hematology negative hematology ROS (+)   Anesthesia Other Findings   Reproductive/Obstetrics negative OB ROS                          Anesthesia Physical Anesthesia Plan  ASA: III  Anesthesia Plan: General   Post-op Pain Management:    Induction: Intravenous  Airway Management Planned: Oral ETT  Additional Equipment:   Intra-op Plan:   Post-operative Plan: Extubation in OR  Informed Consent: I have reviewed the patients History and Physical, chart, labs and discussed the procedure including the risks, benefits and alternatives for the proposed anesthesia with the patient or authorized representative who has indicated his/her understanding and acceptance.   Dental advisory given  Plan Discussed with: Surgeon, CRNA and Anesthesiologist  Anesthesia Plan Comments: (Plan routine monitors, GETA)       Anesthesia Quick Evaluation

## 2011-11-10 NOTE — Anesthesia Postprocedure Evaluation (Signed)
  Anesthesia Post-op Note  Patient: Brandi Bates  Procedure(s) Performed: Procedure(s) (LRB): MASTECTOMY WITH SENTINEL LYMPH NODE BIOPSY (Right) LAPAROSCOPIC BILATERAL SALPINGO OOPHERECTOMY (Bilateral)  Patient Location: PACU  Anesthesia Type: General  Level of Consciousness: awake, alert  and oriented  Airway and Oxygen Therapy: Patient Spontanous Breathing and Patient connected to nasal cannula oxygen  Post-op Pain: mild  Post-op Assessment: Post-op Vital signs reviewed, Patient's Cardiovascular Status Stable, Respiratory Function Stable, Patent Airway, No signs of Nausea or vomiting and Pain level controlled  Post-op Vital Signs: Reviewed and stable  Complications: No apparent anesthesia complications

## 2011-11-10 NOTE — Progress Notes (Signed)
Total 50 cc bloody drainage from r breast JP drain.

## 2011-11-10 NOTE — Progress Notes (Signed)
There has been no change in the patients history, status or exam since the history and physical.  Filed Vitals:   11/10/11 1121  BP: 141/69  Pulse: 90  Temp: 98.2 F (36.8 C)  TempSrc: Oral  Resp: 18  SpO2: 98%    Lab Results  Component Value Date   WBC 4.5 11/06/2011   HGB 13.3 11/06/2011   HCT 40.2 11/06/2011   MCV 86.3 11/06/2011   PLT 173 11/06/2011    Karan Inclan A

## 2011-11-10 NOTE — Transfer of Care (Signed)
Immediate Anesthesia Transfer of Care Note  Patient: Brandi Bates  Procedure(s) Performed: Procedure(s) (LRB): MASTECTOMY WITH SENTINEL LYMPH NODE BIOPSY (Right) LAPAROSCOPIC BILATERAL SALPINGO OOPHERECTOMY (Bilateral)  Patient Location: PACU  Anesthesia Type: General  Level of Consciousness: awake, alert  and oriented  Airway & Oxygen Therapy: Patient Spontanous Breathing and Patient connected to nasal cannula oxygen  Post-op Assessment: Report given to PACU RN and Post -op Vital signs reviewed and stable  Post vital signs: Reviewed and stable  Complications: No apparent anesthesia complications

## 2011-11-10 NOTE — OR Nursing (Signed)
End of first procedure - 1502, Start of second procedure - 1518

## 2011-11-11 ENCOUNTER — Encounter (HOSPITAL_COMMUNITY): Payer: Self-pay | Admitting: Surgery

## 2011-11-11 ENCOUNTER — Encounter: Payer: Self-pay | Admitting: Oncology

## 2011-11-11 MED ORDER — HYDROMORPHONE 0.3 MG/ML IV SOLN
INTRAVENOUS | Status: AC
  Administered 2011-11-11: 16:00:00
  Filled 2011-11-11: qty 25

## 2011-11-11 MED ORDER — BIOTENE DRY MOUTH MT LIQD: 15.0000 mL | Freq: Two times a day (BID) | OROMUCOSAL | Status: DC

## 2011-11-11 NOTE — Op Note (Signed)
11/10/2011    PATIENT:  Dashia S Keeran  55 y.o. female  PRE-OPERATIVE DIAGNOSIS:  Right breast cancer, hx of BRCA 1 mutation  POST-OPERATIVE DIAGNOSIS:  Right breast cancer, hx of BRCA 1 mutation  PROCEDURE:  Procedure(s) (LRB): LAPAROSCOPIC BILATERAL SALPINGO OOPHERECTOMY (Bilateral)  SURGEON:  Surgeon(s) and Role:  Panel 2:    * Douglas A Blackman, MD - Assisting    * Zailyn Rowser A Carlie Solorzano, MD - Primary    EBL:  minimal  SPECIMEN:  Source of Specimen:  Right and left ovaries and tubes  DISPOSITION OF SPECIMEN:  PATHOLOGY  COUNTS:  YES  PLAN OF CARE: Dr. Blackman then proceeded with R mastectomy  PATIENT DISPOSITION:  PACU - hemodynamically stable.   Delay start of Pharmacological VTE agent (>24hrs) due to surgical blood loss or risk of bleeding: not applicable  Technique:  Meds: none  Complications:  none  Findings: Normal appearing ovaries with normal appearing pelvis and cuff.  Appendix was adhesed but normal.  Technique  After adequate general endotracheal anesthesia was achieved, the patient was prepped and draped in the usual sterile fashion.  A sponge stick was placed in the vagina and the bladder was drained with a non latex catheter during the procedure.   A 2 cm incision was made just below the umbilicus and the abdominal wall tented up.  The Veress needle was inserted at a 45 degree angle to the pelvis and no bowel contents or blood were aspirated.  The abdomen was insufflated and the 12 mm trocar placed without complication.  The straight scope was introduced and the above findings noted.  Two 5 mm trocars were inserted bilaterally 5 cm above the iliac crests under direct visualization.  The right ovary and tube were identified way above the ureter and were removed with the Ligasure instrument both cauterizing and incising the IP ligament and peritoneum well away from the pelvic side wall.  The same procedure was performed on the left.  Each ovary and tube were  removed separately in endobags through the umbilicus incision with the aid of the 5 mm scope.  The pedicles were inspected and found to be hemostatic.  After a quick inspection of the pelvis, appendix and liver edge, all instruments were removed and the abdomen was desufflated.  The 12 mm faschial incision was closed with a figure of eight stitch of 2-vicryl and the 5 mm skin incisions closed with 4-0 vicryl rapide.  All skin incisions were closed with dermabond.  All instruments were removed from the vagina and Dr. Blackman repositions and reprepped the patient for his portion of the case. Catharine Kettlewell A      

## 2011-11-11 NOTE — Progress Notes (Signed)
1 Day Post-Op  Subjective: POD#1 Complains of soreness in right axilla Abdomen minimally sore  Objective: Vital signs in last 24 hours: Temp:  [97.2 F (36.2 C)-98.8 F (37.1 C)] 98.2 F (36.8 C) (04/17 0519) Pulse Rate:  [56-90] 66  (04/17 0519) Resp:  [8-20] 17  (04/17 0519) BP: (119-168)/(49-89) 119/49 mmHg (04/17 0519) SpO2:  [96 %-100 %] 98 % (04/17 0519) Weight:  [164 lb (74.39 kg)] 164 lb (74.39 kg) (04/16 1636) Last BM Date: 11/08/11  Intake/Output from previous day: 04/16 0701 - 04/17 0700 In: 3025.4 [P.O.:150; I.V.:2875.4] Out: 1460 [Urine:1300; Drains:60; Blood:100] Intake/Output this shift: Total I/O In: 1045.4 [P.O.:120; I.V.:925.4] Out: 1110 [Urine:1050; Drains:60]  Looks good Chest without hematoma Drain serosang Abdomen soft, non distended  Lab Results:  No results found for this basename: WBC:2,HGB:2,HCT:2,PLT:2 in the last 72 hours BMET No results found for this basename: NA:2,K:2,CL:2,CO2:2,GLUCOSE:2,BUN:2,CREATININE:2,CALCIUM:2 in the last 72 hours PT/INR No results found for this basename: LABPROT:2,INR:2 in the last 72 hours ABG No results found for this basename: PHART:2,PCO2:2,PO2:2,HCO3:2 in the last 72 hours  Studies/Results: No results found.  Anti-infectives: Anti-infectives     Start     Dose/Rate Route Frequency Ordered Stop   11/09/11 1426   ceFAZolin (ANCEF) IVPB 1 g/50 mL premix        1 g 100 mL/hr over 30 Minutes Intravenous 60 min pre-op 11/09/11 1426 11/10/11 1415          Assessment/Plan: s/p Procedure(s) (LRB): MASTECTOMY WITH SENTINEL LYMPH NODE BIOPSY (Right) LAPAROSCOPIC BILATERAL SALPINGO OOPHERECTOMY (Bilateral)  Continue current care   LOS: 1 day    Sheika Coutts A 11/11/2011

## 2011-11-11 NOTE — Brief Op Note (Signed)
11/10/2011    PATIENT:  Brandi Bates  56 y.o. female  PRE-OPERATIVE DIAGNOSIS:  Right breast cancer, hx of BRCA 1 mutation  POST-OPERATIVE DIAGNOSIS:  Right breast cancer, hx of BRCA 1 mutation  PROCEDURE:  Procedure(s) (LRB): LAPAROSCOPIC BILATERAL SALPINGO OOPHERECTOMY (Bilateral)  SURGEON:  Surgeon(s) and Role:  Panel 2:    * Shelly Rubenstein, MD - Assisting    * Loney Laurence, MD - Primary    EBL:  minimal  SPECIMEN:  Source of Specimen:  Right and left ovaries and tubes  DISPOSITION OF SPECIMEN:  PATHOLOGY  COUNTS:  YES  PLAN OF CARE: Dr. Magnus Ivan then proceeded with R mastectomy  PATIENT DISPOSITION:  PACU - hemodynamically stable.   Delay start of Pharmacological VTE agent (>24hrs) due to surgical blood loss or risk of bleeding: not applicable  Technique:  Meds: none  Complications:  none  Findings: Normal appearing ovaries with normal appearing pelvis and cuff.  Appendix was adhesed but normal.  Technique  After adequate general endotracheal anesthesia was achieved, the patient was prepped and draped in the usual sterile fashion.  A sponge stick was placed in the vagina and the bladder was drained with a non latex catheter during the procedure.   A 2 cm incision was made just below the umbilicus and the abdominal wall tented up.  The Veress needle was inserted at a 45 degree angle to the pelvis and no bowel contents or blood were aspirated.  The abdomen was insufflated and the 12 mm trocar placed without complication.  The straight scope was introduced and the above findings noted.  Two 5 mm trocars were inserted bilaterally 5 cm above the iliac crests under direct visualization.  The right ovary and tube were identified way above the ureter and were removed with the Ligasure instrument both cauterizing and incising the IP ligament and peritoneum well away from the pelvic side wall.  The same procedure was performed on the left.  Each ovary and tube were  removed separately in endobags through the umbilicus incision with the aid of the 5 mm scope.  The pedicles were inspected and found to be hemostatic.  After a quick inspection of the pelvis, appendix and liver edge, all instruments were removed and the abdomen was desufflated.  The 12 mm faschial incision was closed with a figure of eight stitch of 2-vicryl and the 5 mm skin incisions closed with 4-0 vicryl rapide.  All skin incisions were closed with dermabond.  All instruments were removed from the vagina and Dr. Magnus Ivan repositions and reprepped the patient for his portion of the case. Birt Reinoso A

## 2011-11-11 NOTE — Progress Notes (Signed)
UR complete 

## 2011-11-11 NOTE — Discharge Instructions (Signed)
[facility] °[phone] ° °MASTECTOMY: POST OP INSTRUCTIONS ° °Always review your discharge instruction sheet given to you by the facility where your surgery was performed. °IF YOU HAVE DISABILITY OR FAMILY LEAVE FORMS, YOU MUST BRING THEM TO THE OFFICE FOR PROCESSING.   °DO NOT GIVE THEM TO YOUR DOCTOR. °A prescription for pain medication may be given to you upon discharge.  Take your pain medication as prescribed, if needed.  If narcotic pain medicine is not needed, then you may take acetaminophen (Tylenol) or ibuprofen (Advil) as needed. °1. Take your usually prescribed medications unless otherwise directed. °2. If you need a refill on your pain medication, please contact your pharmacy.  They will contact our office to request authorization.  Prescriptions will not be filled after 5pm or on week-ends. °3. You should follow a light diet the first few days after arrival home, such as soup and crackers, etc.  Resume your normal diet the day after surgery. °4. Most patients will experience some swelling and bruising on the chest and underarm.  Ice packs will help.  Swelling and bruising can take several days to resolve.  °5. It is common to experience some constipation if taking pain medication after surgery.  Increasing fluid intake and taking a stool softener (such as Colace) will usually help or prevent this problem from occurring.  A mild laxative (Milk of Magnesia or Miralax) should be taken according to package instructions if there are no bowel movements after 48 hours. °6. Unless discharge instructions indicate otherwise, leave your bandage dry and in place until your next appointment in 3-5 days.  You may take a limited sponge bath.  No tube baths or showers until the drains are removed.  You may have steri-strips (small skin tapes) in place directly over the incision.  These strips should be left on the skin for 7-10 days.  If your surgeon used skin glue on the incision, you may  shower in 24 hours.  The glue will flake off over the next 2-3 weeks.  Any sutures or staples will be removed at the office during your follow-up visit. °7. DRAINS:  If you have drains in place, it is important to keep a list of the amount of drainage produced each day in your drains.  Before leaving the hospital, you should be instructed on drain care.  Call our office if you have any questions about your drains. °8. ACTIVITIES:  You may resume regular (light) daily activities beginning the next day--such as daily self-care, walking, climbing stairs--gradually increasing activities as tolerated.  You may have sexual intercourse when it is comfortable.  Refrain from any heavy lifting or straining until approved by your doctor. °a. You may drive when you are no longer taking prescription pain medication, you can comfortably wear a seatbelt, and you can safely maneuver your car and apply brakes. °b. RETURN TO WORK:  __________________________________________________________ °9. You should see your doctor in the office for a follow-up appointment approximately 3-5 days after your surgery.  Your doctor’s nurse will typically make your follow-up appointment when she calls you with your pathology report.  Expect your pathology report 2-3 business days after your surgery.  You may call to check if you do not hear from us after three days.   °10. OTHER INSTRUCTIONS: ______________________________________________________________________________________________ ____________________________________________________________________________________________ °WHEN TO CALL YOUR DOCTOR: °1. Fever over 101.0 °2. Nausea and/or vomiting °3. Extreme swelling or bruising °4. Continued bleeding from incision. °5. Increased pain, redness, or drainage from the incision. °  The clinic staff is available to answer your questions during regular business hours.  Please don’t hesitate to call and ask to speak to one of the nurses for clinical  concerns.  If you have a medical emergency, go to the nearest emergency room or call 911.  A surgeon from [facility] is always on call at the hospital. °[address], [address], [place], [place]  [address] ? [address] [address], [place], [place]   [address] °[phone] ? [phone] ? FAX [phone] °Web site: [email or web address] °

## 2011-11-12 ENCOUNTER — Telehealth (INDEPENDENT_AMBULATORY_CARE_PROVIDER_SITE_OTHER): Payer: Self-pay | Admitting: Surgery

## 2011-11-12 MED ORDER — OXYCODONE-ACETAMINOPHEN 5-325 MG PO TABS: 1.0000 | ORAL_TABLET | ORAL | Status: DC | PRN

## 2011-11-12 NOTE — Progress Notes (Signed)
Patient ID: Brandi Bates, female   DOB: [birth year], 56 y.o.   MRN: [id number] POD#2  Doing well Chest and abdomen stable  discharge

## 2011-11-12 NOTE — Discharge Summary (Signed)
Physician Discharge Summary  Patient ID: Brandi Bates MRN: [id number] DOB/AGE: Feb 24, 1956 56 y.o.  Admit date: 11/10/2011 Discharge date: 11/12/2011  Admission Diagnoses:  Right breast cancer  Discharge Diagnoses: same Active Problems:  * No active hospital problems. *    Discharged Condition: good  Hospital Course: uneventful.  Discharged POD#2  Consults: None  Significant Diagnostic Studies:   Treatments: surgery: right mastectomy and sentinel node bx,  bilat salpingo-oopherectomy  Discharge Exam: Blood pressure 113/52, pulse 64, temperature 98.2 F (36.8 C), temperature source Oral, resp. rate 18, weight 164 lb (74.39 kg), SpO2 99.00%. General appearance: alert and no distress Chest wall: no tenderness GI: soft, non-tender; bowel sounds normal; no masses,  no organomegaly  Disposition: 06-Home-Health Care Svc  Discharge Orders    Future Appointments: Provider: Department: Dept Phone: Center:   12/02/2011 8:30 AM [facility] Nurse [facility] [phone] None   12/02/2011 9:00 AM Jonna Coup, MD [facility] [phone] None   12/08/2011 4:30 PM Lowella Dell, MD [facility] [phone] None     Medication List  As of 11/12/2011  6:32 AM   TAKE these medications         clonazePAM 0.5 MG tablet   Commonly known as: KLONOPIN   Take 0.5 mg by mouth at bedtime as needed. For anxiety      ibuprofen 200 MG tablet   Commonly known as: ADVIL,MOTRIN   Take 600 mg by mouth every 8 (eight) hours as needed. For pain      LYSINE PO   Take 1 tablet by mouth daily.      oxyCODONE-acetaminophen 5-325 MG per tablet   Commonly known as: PERCOCET   Take 1 tablet by mouth every 6 (six) hours as needed. For pain      oxyCODONE-acetaminophen 5-325 MG per tablet   Commonly known as: PERCOCET   Take 1-2 tablets by mouth every 4 (four) hours as needed.      polyethylene glycol packet   Commonly known as: MIRALAX / GLYCOLAX   Take 17 g by mouth daily as  needed. For constipation      VITAMIN B-12 PO   Take 1 tablet by mouth daily.           Follow-up Information    Follow up with [facility] A, MD. Call on 11/24/2011. [phone])    Contact information:   [facility] [address], [address] [place] [address] [phone]          Signed: Shelly Rubenstein 11/12/2011, 6:32 AM

## 2011-11-12 NOTE — Progress Notes (Signed)
Discharged patient to home as ordered, instructions given and verbalized understanding, escorted by sister, no complaints

## 2011-11-17 ENCOUNTER — Other Ambulatory Visit: Payer: Self-pay | Admitting: Oncology

## 2011-11-17 NOTE — Progress Notes (Signed)
ID: Brandi Bates   DOB: [birth year]  MR#: [id number]  FAO#:[id number]  HISTORY OF PRESENT ILLNESS: The patient felt a mass in her left axilla several months ago.  She did not bring this to her physician's attention because she wasn't sure it really meant anything, but she asked her daughter, and her daughter asked a nurse fried, and they told her she better get on and have this evaluated, so she did bring it to Dr. Frederik Pear attention and he set her up for diagnostic mammography and ultrasonography performed at [facility] on September 22, 2010.  Dr. Tilda Burrow was able to demonstrate a spiculated density at the 12 o'clock position of the breast with pleomorphic calcifications measuring about 4 cm.  There was a 2nd focal rounded area of increased density and with the axilla, there was an ovoid mass measuring up to 3.8 cm. Ultrasound showed the area of heterogeneous decreased echogenicity as well as the 2nd discrete focus.  The right breast showed only a cystic mass measuring 1 cm which was of no consequence.  Biopsy was performed of the left breast mass only on the same day.  The pathology report ([id number]) biopsied 2 masses in the left breast.  The second mass was "located more superiorly" and was also identified and biopsied.  The pathology report labels both masses "at 12 o'clock" so we will have to clarify this issue.  At any rate, both masses were Grade 3 invasive ductal carcinoma.  The first one was estrogen receptor positive at 61% but progesterone receptor negative with a proliferation marker of 95%.  The second one was ER positive at 98% and PR "positive" at 3%, also with a proliferation marker of 95%.  Both showed no HER2 amplification.  With this information, the patient was referred for breast MRI.  This was performed September 30, 2010 and showed the main breast mass to measure 3.8 cm.  The posterior mass measured 6 millimeters. There were some confluent axillary masses to a maximum measurement of 6.8 cm. The  patient underwent definitive surgery 10/23/2011 with results and subsequent treatment as detailed below   INTERVAL HISTORY: Arnice returns today for followup of her breast cancer. Since her last visit here she had mammography at [facility] 10/02/2011, which showed an area of asymmetry measuring approximately 1.1 cm in the lateral aspect of the right breast. Ultrasound showed this to measure 5 mm. Biopsy was obtained 10/06/2011, and showed and invasive carcinoma, most likely ductal. The patient is here today to discuss her treatment options.  REVIEW OF SYSTEMS: The abscess at her left mastectomy scar has almost completely healed over. She is not having any fever or bleeding or other systemic symptoms related to this. She does have a variety of chronic problems including constipation, reflux, and headaches. These have not changed in intensity or frequency. She is very anxious and depressed because of her new diagnosis of contralateral breast cancer. She feels her right breast should have been removed last year when she insisted on a. She has discussed this extensively with Dr. Magnus Ivan and she has considered changing surgeons, although she admits she likes and respects Dr. Rayburn Ma and appreciates the fact that he apologize for not having listened to her as well as he could have, in her view, last year  PAST MEDICAL HISTORY: Past Medical History  Diagnosis Date  . Numbness of feet   . GERD (gastroesophageal reflux disease)   . Chronic low back pain     "everyday"  . Eczema   .  Lymphedema of arm     left  . History of radiation therapy 04/23/11 thru 06/08/11    L breast  . Breast wound   . PONV (postoperative nausea and vomiting)   . Migraines   . Neuromuscular disorder     raynauds syndrome   . Shortness of breath on exertion   . Breast cancer 10/23/10    s/p L mastectomy, chemo/radiation, er/pr +, Her2 -  . Breast cancer 11/10/11    S/P right mastectomy  . Anxiety   Significant for history of  migraines, history of GERD, history of chronic constipation, history of chronic low back pain, history of tobacco abuse, the patient quitting in 1998 with approximately a 50 pack-year history prior to that.  The patient is status post tubal ligation, status post simple hysterectomy without salpingo-oophorectomy and status post cholecystectomy.  PAST SURGICAL HISTORY: Past Surgical History  Procedure Date  . Abdominal hysterectomy   . Gallbladder surgery   . Breast surgery   . Irrigation and debridement abscess 06/16/2011    Procedure: IRRIGATION AND DEBRIDEMENT ABSCESS;  Surgeon: Harl Bowie, MD;  Location: [facility];  Service: General;  Laterality: Left;  incision and drainage of left chest wall abcess  . Bilateral salpingoophorectomy 11/10/11    laparoscopy  . Diagnostic laparoscopy   . Mastectomy 11/10/11    right; w/SNB  . Mastectomy modified radical 10/23/11    left  . Cholecystectomy 1999  . Tubal ligation 1979  . Mastectomy w/ sentinel node biopsy 11/10/2011    Procedure: MASTECTOMY WITH SENTINEL LYMPH NODE BIOPSY;  Surgeon: Harl Bowie, MD;  Location: [place];  Service: General;  Laterality: Right;    FAMILY HISTORY Family History  Problem Relation Age of Onset  . Cancer Mother     lung  . Hypertension Mother   . Cancer Father     lung  . Cancer Brother     BRAIN CANCER  . Hypertension Brother   . Cancer Cousin      2 PATERNAL COUSINS - BREAST CA  . Hypertension Sister   . Hypertension Maternal Uncle   . Hypertension Maternal Grandmother   The patient's father died at the age of 85 and the patient's mother at the age of 27.  Both were smokers and both had lung cancer. The patient had one brother who died at age 75 with glioblastoma multiforme.  She has one surviving brother, one surviving sister who is present today, and a half-brother.  There are two cousins, both on the father's side, who had breast cancer in their 29s.  There is one uncle with Leverne Humbles  disease.  GYNECOLOGIC HISTORY: She is GX P1.  First pregnancy to term at age 56.  She never took hormone replacement.    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  Her husband, [name], is a Geologist, engineering for an [employer].  Daughter Leveda Anna works at the same office as her mother.  Son Louie Casa works for [employer].  The patient has 7 grandchildren. She attends a [employer].     ADVANCED DIRECTIVES:  HEALTH MAINTENANCE: History  Substance Use Topics  . Smoking status: Former Smoker -- 2.0 packs/day for 28 years    Types: Cigarettes    Quit date: 05/31/1997  . Smokeless tobacco: Never Used  . Alcohol Use: No     Colonoscopy:  PAP: s/p  hysterectomy  Bone density: SOLIS October 2012, T - 1.4 [facility]  Lipid panel:  Allergies  Allergen Reactions  . Tylox Nausea And Vomiting  . Morphine And Related Hives and Itching    All over the body  . Prilosec Otc Nausea Only  . Gabapentin Other (See Comments)    Unable to sleep  . Latex Rash    Only where touched    Current Outpatient Prescriptions  Medication Sig Dispense Refill  . clonazePAM (KLONOPIN) 0.5 MG tablet Take 0.5 mg by mouth at bedtime as needed. For anxiety      . Cyanocobalamin (VITAMIN B-12 PO) Take 1 tablet by mouth daily.      Marland Kitchen ibuprofen (ADVIL,MOTRIN) 200 MG tablet Take 600 mg by mouth every 8 (eight) hours as needed. For pain      . LYSINE PO Take 1 tablet by mouth daily.       Marland Kitchen oxyCODONE-acetaminophen (PERCOCET) 5-325 MG per tablet Take 1 tablet by mouth every 6 (six) hours as needed. For pain      . oxyCODONE-acetaminophen (PERCOCET) 5-325 MG per tablet Take 1-2 tablets by mouth every 4 (four) hours as needed.  50 tablet  0  . polyethylene glycol (MIRALAX / GLYCOLAX) packet Take 17 g by mouth daily as needed. For constipation      . DISCONTD: gabapentin (NEURONTIN) 300 MG capsule daily.        OBJECTIVE: Middle-aged  white woman who appears anxious There were no vitals filed for this visit.   There is no height or weight on file to calculate BMI.    ECOG FS: 1  Sclerae unicteric Oropharynx clear No peripheral adenopathy, and specifically there is no palpable right axillary adenopathy Lungs no rales or rhonchi Heart regular rate and rhythm Abd benign MSK no focal spinal tenderness, no peripheral edema Neuro: nonfocal Breasts: I do not palpate any mass in the right breast, there is no skin change and no nipple abnormality. The left breast is status post mastectomy. There is no evidence of local recurrence. The area of ulceration is almost completely healed over  LAB RESULTS: Lab Results  Component Value Date   WBC 4.5 11/06/2011   NEUTROABS 3.4 09/10/2011   HGB 13.3 11/06/2011   HCT 40.2 11/06/2011   MCV 86.3 11/06/2011   PLT 173 11/06/2011      Chemistry      Component Value Date/Time   NA 144 11/06/2011 0912   K 4.0 11/06/2011 0912   CL 106 11/06/2011 0912   CO2 26 11/06/2011 0912   BUN 14 11/06/2011 0912   CREATININE 0.63 11/06/2011 0912      Component Value Date/Time   CALCIUM 9.8 11/06/2011 0912   ALKPHOS 61 09/10/2011 1328   AST 21 09/10/2011 1328   ALT 27 09/10/2011 1328   BILITOT 0.3 09/10/2011 1328       Lab Results  Component Value Date   LABCA2 22 09/10/2011    No results found for this basename: INR:1;PROTIME:1 in the last 168 hours  No results found for this basename: UACOL:1,UAPR:1,USPG:1,UPH:1,UTP:1,UGL:1,UKET:1,UBIL:1,UHGB:1,UNIT:1,UROB:1,ULEU:1,UEPI:1,UWBC:1,URBC:1,UBAC:1,CAST:1,CRYS:1,UCOM:1,BILUA:1 in the last 72 hours   STUDIES: No new results found.  ASSESSMENT: A 56 year old [place] woman with a BRCA mutation of unknown clinical significance  (1) status post left modified radical mastectomy March of 2012 for a T3 N3a (stage IIIC)  invasive ductal carcinoma, grade 3,  which was strongly estrogen receptor positive, progesterone receptor and HER2 negative, with an MIB-1  of 95%.   (2) s/p 4  cycles of adjuvant dose dense doxorubicin and cyclophosphamide, and 11 doses of weekly paclitaxel,   (3) s/p radiation to the left chest and regional nodes completed November 2012, at which time she started tamoxifen  (4) s/p Right mastectomy and sentinel lymph node sampling 11/10/2011 for a pT1b pN0, stage IA invasive ductal carcinoma, grade 1, prognostic profile pending  (5) s/p bilateral salpingo-oophorectomy 11/10/2011 withy benign pathology  PLAN: She now has what clinically is 8T1A or T1 C. invasive carcinoma, with a prognostic panel pending. She absolutely wants to have a mastectomy this time and that is not unreasonable. She will need sentinel lymph node sampling, and this mandates that her surgery has to be a common hospital. She would like to have her bilateral salpingo-oophorectomy performed at the same time, and this may be difficult for Dr. Philis Pique to schedule.  The patient consider switching surgeons, but she does like Dr. Ninfa Linden and is going to stick with him. I will as him and Dr. Philis Pique to schedule her surgery as soon as possible. Chamya is going to call me 2 or 3 days postop to get preliminary results. She will see me again in May to make if final plan regarding adjuvant treatment for this second breast cancer.  MAGRINAT,GUSTAV C    11/17/2011

## 2011-11-19 ENCOUNTER — Inpatient Hospital Stay (EMERGENCY_DEPARTMENT_HOSPITAL)
Admission: AD | Admit: 2011-11-19 | Discharge: 2011-11-19 | Disposition: A | Discharge status: Home / Self Care | Payer: BC Managed Care – PPO | Source: Ambulatory Visit | Attending: Obstetrics & Gynecology | Admitting: Obstetrics & Gynecology

## 2011-11-19 ENCOUNTER — Encounter (HOSPITAL_COMMUNITY): Payer: Self-pay | Admitting: Obstetrics and Gynecology

## 2011-11-19 DIAGNOSIS — M62830 Muscle spasm of back: Secondary | ICD-10-CM

## 2011-11-19 DIAGNOSIS — M545 Low back pain, unspecified: Secondary | ICD-10-CM | POA: Insufficient documentation

## 2011-11-19 DIAGNOSIS — M62838 Other muscle spasm: Secondary | ICD-10-CM | POA: Insufficient documentation

## 2011-11-19 DIAGNOSIS — M539 Dorsopathy, unspecified: Secondary | ICD-10-CM

## 2011-11-19 LAB — URINALYSIS, ROUTINE W REFLEX MICROSCOPIC
Ketones, ur: NEGATIVE mg/dL
Leukocytes, UA: NEGATIVE
Nitrite: NEGATIVE
Protein, ur: NEGATIVE mg/dL
pH: 6 (ref 5.0–8.0)

## 2011-11-19 MED ORDER — HYDROMORPHONE HCL 2 MG PO TABS: 2.0000 mg | ORAL_TABLET | ORAL | Status: DC | PRN

## 2011-11-19 MED ORDER — HYDROMORPHONE HCL PF 1 MG/ML IJ SOLN
2.0000 mg | INTRAMUSCULAR | Status: AC
  Administered 2011-11-19: 2 mg via INTRAMUSCULAR
  Filled 2011-11-19: qty 2

## 2011-11-19 MED ORDER — CYCLOBENZAPRINE HCL 10 MG PO TABS: 10.0000 mg | ORAL_TABLET | Freq: Three times a day (TID) | ORAL | Status: AC | PRN

## 2011-11-19 MED ORDER — CYCLOBENZAPRINE HCL 10 MG PO TABS
10.0000 mg | ORAL_TABLET | ORAL | Status: AC
  Administered 2011-11-19: 10 mg via ORAL
  Filled 2011-11-19: qty 1

## 2011-11-19 NOTE — MAU Provider Note (Signed)
History     CSN: [id number]  Arrival date and time: 11/19/11 4098   First Provider Initiated Contact with Patient 11/19/11 2027     55 y.o.G2P2 Chief Complaint  Patient presents with  . Back Pain   HPI Pt is s/p Right mastectomy and bilateral oophorectomy on 11/10/11 with history of chronic back pain presenting with sudden onset sharp intense pain in her lower back.  She appears uncomfortable, is crying with pain, and reports that changing positions is very difficult because of the pain.    She reports her postop pain was in her chest and lower abdomen and has resolved and the back pain is new onset today.   OB History    Grav Para Term Preterm Abortions TAB SAB Ect Mult Living   2 2              Past Medical History  Diagnosis Date  . Numbness of feet   . GERD (gastroesophageal reflux disease)   . Chronic low back pain     "everyday"  . Eczema   . Lymphedema of arm     left  . History of radiation therapy 04/23/11 thru 06/08/11    L breast  . Breast wound   . PONV (postoperative nausea and vomiting)   . Migraines   . Neuromuscular disorder     raynauds syndrome   . Shortness of breath on exertion   . Breast cancer 10/23/10    s/p L mastectomy, chemo/radiation, er/pr +, Her2 -  . Breast cancer 11/10/11    S/P right mastectomy  . Anxiety     Past Surgical History  Procedure Date  . Abdominal hysterectomy   . Gallbladder surgery   . Breast surgery   . Irrigation and debridement abscess 06/16/2011    Procedure: IRRIGATION AND DEBRIDEMENT ABSCESS;  Surgeon: Shelly Rubenstein, MD;  Location: [facility];  Service: General;  Laterality: Left;  incision and drainage of left chest wall abcess  . Bilateral salpingoophorectomy 11/10/11    laparoscopy  . Diagnostic laparoscopy   . Mastectomy 11/10/11    right; w/SNB  . Mastectomy modified radical 10/23/11    left  . Cholecystectomy 1999  . Tubal ligation 1979  . Mastectomy w/ sentinel node biopsy 11/10/2011    Procedure:  MASTECTOMY WITH SENTINEL LYMPH NODE BIOPSY;  Surgeon: Shelly Rubenstein, MD;  Location: [facility];  Service: General;  Laterality: Right;    Family History  Problem Relation Age of Onset  . Cancer Mother     lung  . Hypertension Mother   . Cancer Father     lung  . Cancer Brother     BRAIN CANCER  . Hypertension Brother   . Cancer Cousin      2 PATERNAL COUSINS - BREAST CA  . Hypertension Sister   . Hypertension Maternal Uncle   . Hypertension Maternal Grandmother     History  Substance Use Topics  . Smoking status: Former Smoker -- 2.0 packs/day for 28 years    Types: Cigarettes    Quit date: 05/31/1997  . Smokeless tobacco: Never Used  . Alcohol Use: No    Allergies:  Allergies  Allergen Reactions  . Tylox Nausea And Vomiting  . Morphine And Related Hives and Itching    All over the body  . Prilosec Otc Nausea Only  . Gabapentin Other (See Comments)    Unable to sleep  . Latex Rash    Only where touched  Prescriptions prior to admission  Medication Sig Dispense Refill  . clonazePAM (KLONOPIN) 0.5 MG tablet Take 0.5 mg by mouth at bedtime as needed. For anxiety      . Cyanocobalamin (VITAMIN B-12 PO) Take 1 tablet by mouth daily.      Marland Kitchen ibuprofen (ADVIL,MOTRIN) 200 MG tablet Take 600 mg by mouth every 8 (eight) hours as needed. For pain      . LYSINE PO Take 1 tablet by mouth daily.       Marland Kitchen oxyCODONE-acetaminophen (PERCOCET) 5-325 MG per tablet Take 1-2 tablets by mouth every 4 (four) hours as needed.  50 tablet  0  . polyethylene glycol (MIRALAX / GLYCOLAX) packet Take 17 g by mouth daily as needed. For constipation      . DISCONTD: oxyCODONE-acetaminophen (PERCOCET) 5-325 MG per tablet Take 1 tablet by mouth every 6 (six) hours as needed. For pain        Review of Systems  Constitutional: Negative for fever, chills and malaise/fatigue.  Eyes: Negative for blurred vision.  Respiratory: Negative for cough and shortness of breath.   Cardiovascular: Negative for  chest pain.  Gastrointestinal: Negative for heartburn, nausea, vomiting, abdominal pain, constipation and blood in stool.  Genitourinary: Negative for dysuria, urgency, frequency and flank pain.  Musculoskeletal: Positive for back pain.  Neurological: Negative for dizziness and headaches.  Psychiatric/Behavioral: Negative for depression.   Physical Exam   Blood pressure 149/89, pulse 92, temperature 98 F (36.7 C), temperature source Oral, resp. rate 22, SpO2 100.00%.  Physical Exam  Nursing note and vitals reviewed. Constitutional: She is oriented to person, place, and time. She appears well-developed and well-nourished.  Neck: Normal range of motion.  Cardiovascular: Normal rate, regular rhythm and normal heart sounds.   Respiratory: Breath sounds normal. Tachypnea noted. She exhibits tenderness.    GI: Soft. There is tenderness in the right lower quadrant and left lower quadrant. There is no rebound, no guarding, no CVA tenderness and no tenderness at McBurney's point.  Musculoskeletal: Normal range of motion.       Lumbar back: She exhibits tenderness, pain and spasm.       Back:  Neurological: She is alert and oriented to person, place, and time.  Skin: Skin is warm and dry.  Psychiatric: She has a normal mood and affect. Her behavior is normal. Judgment and thought content normal.  Negative CVA tenderness  Results for orders placed during the hospital encounter of 11/19/11 (from the past 24 hour(s))  URINALYSIS, ROUTINE W REFLEX MICROSCOPIC     Status: Abnormal   Collection Time   11/19/11  7:50 PM      Component Value Range   Color, Urine YELLOW  YELLOW    APPearance CLEAR  CLEAR    Specific Gravity, Urine 1.010  1.005 - 1.030    pH 6.0  5.0 - 8.0    Glucose, UA NEGATIVE  NEGATIVE (mg/dL)   Hgb urine dipstick MODERATE (*) NEGATIVE    Bilirubin Urine NEGATIVE  NEGATIVE    Ketones, ur NEGATIVE  NEGATIVE (mg/dL)   Protein, ur NEGATIVE  NEGATIVE (mg/dL)   Urobilinogen,  UA 0.2  0.0 - 1.0 (mg/dL)   Nitrite NEGATIVE  NEGATIVE    Leukocytes, UA NEGATIVE  NEGATIVE   URINE MICROSCOPIC-ADD ON     Status: Abnormal   Collection Time   11/19/11  7:50 PM      Component Value Range   Squamous Epithelial / LPF FEW (*) RARE    WBC,  UA 3-6  <3 (WBC/hpf)   RBC / HPF 3-6  <3 (RBC/hpf)   Urine-Other MUCOUS PRESENT     MAU Course  Procedures Discussed pt assessment and findings with Dr Aldona Bar in MAU Dilaudid 2 mg IM and Flexeril 10 mg PO in MAU Pt reports significant relief with medications  Assessment and Plan  Musculoskeletal pain Muscle spasms  D/C home Dilaudid 2-4 mg PO Q 4 hours Flexeril 10 mg PO Q 8 hours F/U as scheduled with Dr Henderson Cloud tomorrow Return to MAU as needed  LEFTWICH-KIRBY, Chareese Sergent 11/19/2011, 8:45 PM

## 2011-11-19 NOTE — Discharge Instructions (Signed)
Musculoskeletal Pain   Musculoskeletal pain is muscle and boney aches and pains. These pains can occur in any part of the body. Your caregiver may treat you without knowing the cause of the pain. They may treat you if blood or urine tests, X-rays, and other tests were normal.   CAUSES   There is often not a definite cause or reason for these pains. These pains may be caused by a type of germ (virus). The discomfort may also come from overuse. Overuse includes working out too hard when your body is not fit. Boney aches also come from weather changes. Bone is sensitive to atmospheric pressure changes.   HOME CARE INSTRUCTIONS   Ask when your test results will be ready. Make sure you get your test results.   Only take over-the-counter or prescription medicines for pain, discomfort, or fever as directed by your caregiver. If you were given medications for your condition, do not drive, operate machinery or power tools, or sign legal documents for 24 hours. Do not drink alcohol. Do not take sleeping pills or other medications that may interfere with treatment.   Continue all activities unless the activities cause more pain. When the pain lessens, slowly resume normal activities. Gradually increase the intensity and duration of the activities or exercise.   During periods of severe pain, bed rest may be helpful. Lay or sit in any position that is comfortable.   Putting ice on the injured area.   Put ice in a bag.   Place a towel between your skin and the bag.   Leave the ice on for 15 to 20 minutes, 3 to 4 times a day.   Follow up with your caregiver for continued problems and no reason can be found for the pain. If the pain becomes worse or does not go away, it may be necessary to repeat tests or do additional testing. Your caregiver may need to look further for a possible cause.   SEEK IMMEDIATE MEDICAL CARE IF:   You have pain that is getting worse and is not relieved by medications.   You develop chest pain that is  associated with shortness or breath, sweating, feeling sick to your stomach (nauseous), or throw up (vomit).   Your pain becomes localized to the abdomen.   You develop any new symptoms that seem different or that concern you.   MAKE SURE YOU:   Understand these instructions.   Will watch your condition.   Will get help right away if you are not doing well or get worse.   Document Released: 07/13/2005 Document Revised: 07/02/2011 Document Reviewed: 03/02/2008   [employer]® Patient Information ©2012 [employer].

## 2011-11-19 NOTE — MAU Note (Signed)
Pt reports really bad pain in mid lower back since this am, intensified this pm .denies nausea , vomiting, diarrhea, fever.

## 2011-11-20 ENCOUNTER — Other Ambulatory Visit (HOSPITAL_COMMUNITY): Payer: Self-pay | Admitting: Obstetrics and Gynecology

## 2011-11-20 ENCOUNTER — Encounter (HOSPITAL_COMMUNITY): Payer: Self-pay | Admitting: *Deleted

## 2011-11-20 ENCOUNTER — Inpatient Hospital Stay (HOSPITAL_COMMUNITY): Admission: AD | Admit: 2011-11-20 | Payer: BC Managed Care – PPO | Admitting: Obstetrics and Gynecology

## 2011-11-20 ENCOUNTER — Inpatient Hospital Stay (HOSPITAL_COMMUNITY)
Admission: EM | Admit: 2011-11-20 | Discharge: 2011-11-26 | DRG: 541 | Disposition: A | Discharge status: Home / Self Care | Payer: BC Managed Care – PPO | Attending: Internal Medicine | Admitting: Internal Medicine

## 2011-11-20 ENCOUNTER — Emergency Department (HOSPITAL_COMMUNITY): Payer: BC Managed Care – PPO

## 2011-11-20 ENCOUNTER — Other Ambulatory Visit: Payer: Self-pay

## 2011-11-20 ENCOUNTER — Ambulatory Visit (HOSPITAL_COMMUNITY)
Admission: RE | Admit: 2011-11-20 | Discharge: 2011-11-20 | Disposition: A | Discharge status: Home / Self Care | Payer: BC Managed Care – PPO | Source: Ambulatory Visit | Attending: Obstetrics and Gynecology | Admitting: Obstetrics and Gynecology

## 2011-11-20 DIAGNOSIS — R0602 Shortness of breath: Secondary | ICD-10-CM | POA: Diagnosis present

## 2011-11-20 DIAGNOSIS — K5900 Constipation, unspecified: Secondary | ICD-10-CM | POA: Diagnosis present

## 2011-11-20 DIAGNOSIS — M549 Dorsalgia, unspecified: Secondary | ICD-10-CM | POA: Insufficient documentation

## 2011-11-20 DIAGNOSIS — C50919 Malignant neoplasm of unspecified site of unspecified female breast: Secondary | ICD-10-CM | POA: Diagnosis present

## 2011-11-20 DIAGNOSIS — IMO0002 Reserved for concepts with insufficient information to code with codable children: Secondary | ICD-10-CM

## 2011-11-20 DIAGNOSIS — F411 Generalized anxiety disorder: Secondary | ICD-10-CM | POA: Diagnosis present

## 2011-11-20 DIAGNOSIS — Z90710 Acquired absence of both cervix and uterus: Secondary | ICD-10-CM | POA: Insufficient documentation

## 2011-11-20 DIAGNOSIS — K760 Fatty (change of) liver, not elsewhere classified: Secondary | ICD-10-CM | POA: Diagnosis present

## 2011-11-20 DIAGNOSIS — E663 Overweight: Secondary | ICD-10-CM

## 2011-11-20 DIAGNOSIS — K7689 Other specified diseases of liver: Secondary | ICD-10-CM | POA: Insufficient documentation

## 2011-11-20 DIAGNOSIS — Z853 Personal history of malignant neoplasm of breast: Secondary | ICD-10-CM

## 2011-11-20 DIAGNOSIS — D72829 Elevated white blood cell count, unspecified: Secondary | ICD-10-CM | POA: Diagnosis present

## 2011-11-20 DIAGNOSIS — I2699 Other pulmonary embolism without acute cor pulmonale: Secondary | ICD-10-CM

## 2011-11-20 DIAGNOSIS — C50911 Malignant neoplasm of unspecified site of right female breast: Secondary | ICD-10-CM

## 2011-11-20 DIAGNOSIS — N8111 Cystocele, midline: Secondary | ICD-10-CM | POA: Insufficient documentation

## 2011-11-20 DIAGNOSIS — D630 Anemia in neoplastic disease: Secondary | ICD-10-CM | POA: Diagnosis present

## 2011-11-20 DIAGNOSIS — J189 Pneumonia, unspecified organism: Secondary | ICD-10-CM | POA: Diagnosis present

## 2011-11-20 DIAGNOSIS — Z9010 Acquired absence of unspecified breast and nipple: Secondary | ICD-10-CM

## 2011-11-20 DIAGNOSIS — Z6829 Body mass index (BMI) 29.0-29.9, adult: Secondary | ICD-10-CM

## 2011-11-20 DIAGNOSIS — K219 Gastro-esophageal reflux disease without esophagitis: Secondary | ICD-10-CM | POA: Diagnosis present

## 2011-11-20 DIAGNOSIS — Z9221 Personal history of antineoplastic chemotherapy: Secondary | ICD-10-CM

## 2011-11-20 DIAGNOSIS — Z923 Personal history of irradiation: Secondary | ICD-10-CM

## 2011-11-20 DIAGNOSIS — D649 Anemia, unspecified: Secondary | ICD-10-CM | POA: Diagnosis present

## 2011-11-20 HISTORY — DX: Other pulmonary embolism without acute cor pulmonale: I26.99

## 2011-11-20 HISTORY — DX: Reserved for concepts with insufficient information to code with codable children: IMO0002

## 2011-11-20 HISTORY — DX: Fatty (change of) liver, not elsewhere classified: K76.0

## 2011-11-20 LAB — URINALYSIS, ROUTINE W REFLEX MICROSCOPIC
Bilirubin Urine: NEGATIVE
Specific Gravity, Urine: >1.046 — ABNORMAL HIGH (ref 1.005–1.030)
pH: 6 (ref 5.0–8.0)

## 2011-11-20 LAB — CBC
HCT: 34.2 % — ABNORMAL LOW (ref 36.0–46.0)
MCHC: 33 g/dL (ref 30.0–36.0)
MCV: 86.4 fL (ref 78.0–100.0)
Platelets: 157 10*3/uL (ref 150–400)
Platelets: 161 10*3/uL (ref 150–400)
RDW: 13.9 % (ref 11.5–15.5)
RDW: 14.3 % (ref 11.5–15.5)
WBC: 10.9 10*3/uL — ABNORMAL HIGH (ref 4.0–10.5)
WBC: 11.4 10*3/uL — ABNORMAL HIGH (ref 4.0–10.5)

## 2011-11-20 LAB — PROTIME-INR
INR: 1.09 (ref 0.00–1.49)
INR: 1.16 (ref 0.00–1.49)
Prothrombin Time: 14.3 seconds (ref 11.6–15.2)

## 2011-11-20 LAB — COMPREHENSIVE METABOLIC PANEL
AST: 18 U/L (ref 0–37)
Albumin: 3.2 g/dL — ABNORMAL LOW (ref 3.5–5.2)
BUN: 8 mg/dL (ref 6–23)
Chloride: 101 mEq/L (ref 96–112)
Creatinine, Ser: 0.57 mg/dL (ref 0.50–1.10)
Total Bilirubin: 0.6 mg/dL (ref 0.3–1.2)
Total Protein: 6.7 g/dL (ref 6.0–8.3)

## 2011-11-20 LAB — APTT
aPTT: 34 seconds (ref 24–37)
aPTT: 48 seconds — ABNORMAL HIGH (ref 24–37)

## 2011-11-20 LAB — URINE MICROSCOPIC-ADD ON

## 2011-11-20 LAB — DIFFERENTIAL
Basophils Absolute: 0 10*3/uL (ref 0.0–0.1)
Lymphocytes Relative: 9 % — ABNORMAL LOW (ref 12–46)
Neutro Abs: 9.6 10*3/uL — ABNORMAL HIGH (ref 1.7–7.7)

## 2011-11-20 MED ORDER — SODIUM CHLORIDE 0.9 % IJ SOLN
3.0000 mL | Freq: Two times a day (BID) | INTRAMUSCULAR | Status: DC
  Administered 2011-11-22: 3 mL via INTRAVENOUS

## 2011-11-20 MED ORDER — VITAMIN B-12 100 MCG PO TABS
100.0000 ug | ORAL_TABLET | Freq: Every day | ORAL | Status: DC
  Administered 2011-11-20 – 2011-11-26 (×7): 100 ug via ORAL
  Filled 2011-11-20 (×7): qty 1

## 2011-11-20 MED ORDER — ENOXAPARIN SODIUM 80 MG/0.8ML ~~LOC~~ SOLN
1.0000 mg/kg | Freq: Two times a day (BID) | SUBCUTANEOUS | Status: DC
  Administered 2011-11-20 – 2011-11-21 (×2): 75 mg via SUBCUTANEOUS
  Filled 2011-11-20: qty 1.2
  Filled 2011-11-20 (×3): qty 0.8
  Filled 2011-11-20: qty 1.2

## 2011-11-20 MED ORDER — CYCLOBENZAPRINE HCL 10 MG PO TABS
10.0000 mg | ORAL_TABLET | Freq: Three times a day (TID) | ORAL | Status: DC | PRN
  Administered 2011-11-20 – 2011-11-21 (×2): 10 mg via ORAL
  Filled 2011-11-20 (×3): qty 1

## 2011-11-20 MED ORDER — IOHEXOL 300 MG/ML  SOLN
100.0000 mL | Freq: Once | INTRAMUSCULAR | Status: AC | PRN
  Administered 2011-11-20: 100 mL via INTRAVENOUS

## 2011-11-20 MED ORDER — HYDROMORPHONE HCL PF 1 MG/ML IJ SOLN
1.0000 mg | INTRAMUSCULAR | Status: DC | PRN
  Administered 2011-11-21: 1 mg via INTRAVENOUS
  Filled 2011-11-20: qty 1

## 2011-11-20 MED ORDER — HYDROMORPHONE HCL 4 MG PO TABS: 2.0000 mg | ORAL_TABLET | ORAL | Status: DC | PRN

## 2011-11-20 MED ORDER — CLONAZEPAM 0.5 MG PO TABS: 0.5000 mg | ORAL_TABLET | Freq: Two times a day (BID) | ORAL | Status: DC | PRN

## 2011-11-20 MED ORDER — ALBUTEROL SULFATE (5 MG/ML) 0.5% IN NEBU: 2.5000 mg | INHALATION_SOLUTION | RESPIRATORY_TRACT | Status: DC | PRN

## 2011-11-20 MED ORDER — MOXIFLOXACIN HCL IN NACL 400 MG/250ML IV SOLN
400.0000 mg | INTRAVENOUS | Status: DC
  Administered 2011-11-20 – 2011-11-23 (×4): 400 mg via INTRAVENOUS
  Filled 2011-11-20 (×4): qty 250

## 2011-11-20 MED ORDER — IOHEXOL 300 MG/ML  SOLN
100.0000 mL | Freq: Once | INTRAMUSCULAR | Status: AC | PRN
  Administered 2011-11-20: 63 mL via INTRAVENOUS

## 2011-11-20 MED ORDER — HYDROMORPHONE HCL PF 1 MG/ML IJ SOLN
1.0000 mg | INTRAMUSCULAR | Status: AC | PRN
  Administered 2011-11-20 (×3): 1 mg via INTRAVENOUS
  Filled 2011-11-20 (×3): qty 1

## 2011-11-20 MED ORDER — POLYETHYLENE GLYCOL 3350 17 G PO PACK
17.0000 g | PACK | Freq: Every day | ORAL | Status: DC | PRN
  Filled 2011-11-20: qty 1

## 2011-11-20 MED ORDER — SODIUM CHLORIDE 0.9 % IJ SOLN
10.0000 mL | INTRAMUSCULAR | Status: DC | PRN
  Administered 2011-11-26: 10 mL

## 2011-11-20 NOTE — H&P (Addendum)
PCP:  Gaye Alken, MD, MD   DOA:  11/20/2011  2:04 PM  Chief Complaint:  Shortness of breath and back pain  HPI:  56 year old female with past medical history including but not limited to left breast carcinoma status post left modified radical mastectomy (09/2010) and now right breast cancer status post recent mastectomy who presented to ED with complaints of lower and mid back pain stated few days ago but has worsened since then. The pain is described as sharp, non radiating, worse with movement and not relieved with her usual analgesics (percocet and dilaudid). Patient recalls no similar type of pain in past. No complaints of cough or fever or chills. She does report feeling short of breath and occasional chest tightness. No reports of lightheadedness or dizziness or loss of consciousness, no abdominal pain, no nausea or vomiting, no blood in stool or urine and no diarrhea or constipation.  Assessment/Plan  Principal Problem  *Acute pulmonary embolism - in the area of right lower lobe - likely secondary to malignancy - lovenox sub Q already initiated by ED attending and we will continue this regimen - provide albuterol Q 2 hours PRN shortness of breath - we will cycle cardiac enzymes and obtain 2 D ECHO to evaluate for right heart strain - obtain TSH level  Active Problems:  Community acquired pneumonia - we will start Avelox 400 mg IV daily - O2 support via 2 L nasal canula to keep O2 saturation above 90% - nebulizer treatment Q 2 hours PRN shortness of breath  Leukocytosis - likely secondary to combination of pulmonary embolism and pneumonia - we will start avelox 400 mg IV daily  Anemia - likely secondary to malignancy - hemoglobin stable on admission  DVT Prophylaxis - on lovenox subQ for pulmonary embolism  Code Status - full code  Education  - test results and diagnostic studies were discussed with patient  - patient verbalized the understanding -  questions were answered at the bedside and contact information was provided for additional questions or concerns  Allergies: Allergies  Allergen Reactions  . Tylox Nausea And Vomiting  . Morphine And Related Hives and Itching    All over the body  . Prilosec Otc Nausea Only  . Gabapentin Other (See Comments)    Unable to sleep  . Latex Rash    Only where touched    Prior to Admission medications   Medication Sig Start Date End Date Taking? Authorizing Provider  clonazePAM (KLONOPIN) 0.5 MG tablet Take 0.5 mg by mouth at bedtime as needed. For anxiety   Yes Historical Provider, MD  Cyanocobalamin (VITAMIN B-12 PO) Take 1 tablet by mouth daily.   Yes Historical Provider, MD  cyclobenzaprine (FLEXERIL) 10 MG tablet Take 1 tablet (10 mg total) by mouth 3 (three) times daily as needed for muscle spasms. 11/19/11 11/29/11 Yes Lisa A Leftwich-Kirby, CNM  HYDROmorphone (DILAUDID) 2 MG tablet Take 1-2 tablets (2-4 mg total) by mouth every 4 (four) hours as needed for pain. 11/19/11 11/29/11 Yes Lisa A Leftwich-Kirby, CNM  ibuprofen (ADVIL,MOTRIN) 200 MG tablet Take 600 mg by mouth every 8 (eight) hours as needed. For pain   Yes Historical Provider, MD  LYSINE PO Take 1 tablet by mouth daily.    Yes Historical Provider, MD  oxyCODONE-acetaminophen (PERCOCET) 5-325 MG per tablet  10/16/11  Yes Historical Provider, MD  polyethylene glycol (MIRALAX / GLYCOLAX) packet Take 17 g by mouth daily as needed. For constipation   Yes Historical Provider,  MD    Past Medical History  Diagnosis Date  . Numbness of feet   . GERD (gastroesophageal reflux disease)   . Chronic low back pain     "everyday"  . Eczema   . Lymphedema of arm     left  . History of radiation therapy 04/23/11 thru 06/08/11    L breast  . Breast wound   . PONV (postoperative nausea and vomiting)   . Migraines   . Neuromuscular disorder     raynauds syndrome   . Shortness of breath on exertion   . Breast cancer 10/23/10    s/p L  mastectomy, chemo/radiation, er/pr +, Her2 -  . Breast cancer 11/10/11    S/P right mastectomy  . Anxiety     Past Surgical History  Procedure Date  . Abdominal hysterectomy   . Gallbladder surgery   . Breast surgery   . Irrigation and debridement abscess 06/16/2011    Procedure: IRRIGATION AND DEBRIDEMENT ABSCESS;  Surgeon: Shelly Rubenstein, MD;  Location: [facility];  Service: General;  Laterality: Left;  incision and drainage of left chest wall abcess  . Bilateral salpingoophorectomy 11/10/11    laparoscopy  . Diagnostic laparoscopy   . Mastectomy 11/10/11    right; w/[facility]  . Mastectomy modified radical 10/23/11    left  . Cholecystectomy 1999  . Tubal ligation 1979  . Mastectomy w/ sentinel node biopsy 11/10/2011    Procedure: MASTECTOMY WITH SENTINEL LYMPH NODE BIOPSY;  Surgeon: Shelly Rubenstein, MD;  Location: [facility];  Service: General;  Laterality: Right;    Social History:  reports that she quit smoking about 14 years ago. Her smoking use included Cigarettes. She has a 56 pack-year smoking history. She has never used smokeless tobacco. She reports that she does not drink alcohol or use illicit drugs.  Family History  Problem Relation Age of Onset  . Cancer Mother     lung  . Hypertension Mother   . Cancer Father     lung  . Cancer Brother     BRAIN CANCER  . Hypertension Brother   . Cancer Cousin      2 PATERNAL COUSINS - BREAST CA  . Hypertension Sister   . Hypertension Maternal Uncle   . Hypertension Maternal Grandmother     Review of Systems:  Constitutional: Denies fever, chills, diaphoresis, appetite change and fatigue.  HEENT: Denies photophobia, eye pain, redness, hearing loss, ear pain, congestion, sore throat, rhinorrhea, sneezing, mouth sores, trouble swallowing, neck pain, neck stiffness and tinnitus.   Respiratory: per HPI.   Cardiovascular: Denies chest pain, palpitations and leg swelling.  Gastrointestinal: Denies nausea, vomiting, abdominal pain,  diarrhea, constipation, blood in stool and abdominal distention.  Genitourinary: Denies dysuria, urgency, frequency, hematuria, flank pain and difficulty urinating.  Musculoskeletal: Denies myalgias, back pain, joint swelling, arthralgias and gait problem.  Skin: Denies pallor, rash and wound.  Neurological: Denies dizziness, seizures, syncope, weakness, light-headedness, numbness and headaches.  Hematological: Denies adenopathy. Easy bruising, personal or family bleeding history  Psychiatric/Behavioral: Denies suicidal ideation, mood changes, confusion, nervousness, sleep disturbance and agitation   Physical Exam:  Filed Vitals:   11/20/11 1808 11/20/11 1815 11/20/11 1821 11/20/11 1830  BP: 135/61 121/50  114/44  Pulse: 101 99  101  Temp:      TempSrc:      Resp: 28 27  26   Height:   5' 2.99" (1.6 m)   Weight:   74.4 kg (164 lb 0.4 oz)  SpO2: 98% 97%  97%    Constitutional: Vital signs reviewed.  Patient is in no acute distress and cooperative with exam. Alert and oriented x3.  Head: Normocephalic and atraumatic Ear: TM normal bilaterally Mouth: no erythema or exudates, MMM Eyes: PERRL, EOMI, conjunctivae normal, No scleral icterus.  Neck: Supple, Trachea midline normal ROM, No JVD, mass, thyromegaly, or carotid bruit present.  Cardiovascular: RRR, S1 normal, S2 normal, no MRG, pulses symmetric and intact bilaterally; bilateral mastectomy Pulmonary/Chest: CTAB, no wheezes, rales, or rhonchi Abdominal: Soft. Non-tender, non-distended, bowel sounds are normal, no masses, organomegaly, or guarding present.  GU: no CVA tenderness Musculoskeletal: No joint deformities, erythema, or stiffness, ROM full and no nontender Ext: no edema and no cyanosis, pulses palpable bilaterally (DP and PT) Hematology: no cervical, inginal, or axillary adenopathy.  Neurological: A&O x3, Strenght is normal and symmetric bilaterally, cranial nerve II-XII are grossly intact, no focal motor deficit, sensory  intact to light touch bilaterally.  Skin: Warm, dry and intact. No rash, cyanosis, or clubbing.  Psychiatric: Normal mood and affect. speech and behavior is normal. Judgment and thought content normal. Cognition and memory are normal.   Labs on Admission:  Results for orders placed during the hospital encounter of 11/20/11 (from the past 48 hour(s))  URINALYSIS, ROUTINE W REFLEX MICROSCOPIC     Status: Abnormal   Collection Time   11/20/11  2:12 PM      Component Value Range Comment   Color, Urine YELLOW  YELLOW     APPearance CLEAR  CLEAR     Specific Gravity, Urine >1.046 (*) 1.005 - 1.030     pH 6.0  5.0 - 8.0     Glucose, UA NEGATIVE  NEGATIVE (mg/dL)    Hgb urine dipstick SMALL (*) NEGATIVE     Bilirubin Urine NEGATIVE  NEGATIVE     Ketones, ur NEGATIVE  NEGATIVE (mg/dL)    Protein, ur NEGATIVE  NEGATIVE (mg/dL)    Urobilinogen, UA 0.2  0.0 - 1.0 (mg/dL)    Nitrite NEGATIVE  NEGATIVE     Leukocytes, UA NEGATIVE  NEGATIVE    URINE MICROSCOPIC-ADD ON     Status: Normal   Collection Time   11/20/11  2:12 PM      Component Value Range Comment   Squamous Epithelial / LPF RARE  RARE     WBC, UA 0-2  <3 (WBC/hpf)    RBC / HPF 0-2  <3 (RBC/hpf)    Bacteria, UA RARE  RARE    CBC     Status: Abnormal   Collection Time   11/20/11  3:40 PM      Component Value Range Comment   WBC 10.9 (*) 4.0 - 10.5 (K/uL)    RBC 3.96  3.87 - 5.11 (MIL/uL)    Hemoglobin 11.3 (*) 12.0 - 15.0 (g/dL)    HCT 40.9 (*) 81.1 - 46.0 (%)    MCV 86.4  78.0 - 100.0 (fL)    MCH 28.5  26.0 - 34.0 (pg)    MCHC 33.0  30.0 - 36.0 (g/dL)    RDW 91.4  78.2 - 95.6 (%)    Platelets 157  150 - 400 (K/uL)   COMPREHENSIVE METABOLIC PANEL     Status: Abnormal   Collection Time   11/20/11  3:40 PM      Component Value Range Comment   Sodium 137  135 - 145 (mEq/L)    Potassium 3.7  3.5 - 5.1 (mEq/L)    Chloride 101  96 - 112 (mEq/L)    CO2 24  19 - 32 (mEq/L)    Glucose, Bld 144 (*) 70 - 99 (mg/dL)    BUN 8  6 - 23  (mg/dL)    Creatinine, Ser 4.09  0.50 - 1.10 (mg/dL)    Calcium 9.0  8.4 - 10.5 (mg/dL)    Total Protein 6.7  6.0 - 8.3 (g/dL)    Albumin 3.2 (*) 3.5 - 5.2 (g/dL)    AST 18  0 - 37 (U/L)    ALT 23  0 - 35 (U/L)    Alkaline Phosphatase 59  39 - 117 (U/L)    Total Bilirubin 0.6  0.3 - 1.2 (mg/dL)    GFR calc non Af Amer >90  >90 (mL/min)    GFR calc Af Amer >90  >90 (mL/min)     Radiological Exams on Admission: No results found.  Time Spent on Admission: Over 30 minutes  Brandi Bates 11/20/2011, 7:17 PM  [facility] Pager # [phone] Main Office # [phone]

## 2011-11-20 NOTE — ED Provider Notes (Signed)
History    CSN: [id number] Arrival date & time 11/20/11  1358 First MD Initiated Contact with Patient 11/20/11 1501     Chief Complaint  Patient presents with  . Back Pain    HPI Pt has  Been having pain in the lower back.  The pain has been ongoing for the last week.  Pt had her mastectomy as well as oopherectomy last week.  The pain increases with movement.  She has been taking dilaudid and percocet without much relief.  She does get short of breath with movement and when the pain occurs.  She was seen yesterday in the hospital and was treated for her pain.  She had an outpatient CT abdomen/pelvis today that showed questionable pna.  Pt was told by her doctor to come to the ED because of her persistent severe pain and possible pna.  Pt denies cough, abdominal pain, no dysuria.  No weakness or numbness.  The pain is sharp and severe. Past Medical History  Diagnosis Date  . Numbness of feet   . GERD (gastroesophageal reflux disease)   . Chronic low back pain     "everyday"  . Eczema   . Lymphedema of arm     left  . History of radiation therapy 04/23/11 thru 06/08/11    L breast  . Breast wound   . PONV (postoperative nausea and vomiting)   . Migraines   . Neuromuscular disorder     raynauds syndrome   . Shortness of breath on exertion   . Breast cancer 10/23/10    s/p L mastectomy, chemo/radiation, er/pr +, Her2 -  . Breast cancer 11/10/11    S/P right mastectomy  . Anxiety     Past Surgical History  Procedure Date  . Abdominal hysterectomy   . Gallbladder surgery   . Breast surgery   . Irrigation and debridement abscess 06/16/2011    Procedure: IRRIGATION AND DEBRIDEMENT ABSCESS;  Surgeon: Shelly Rubenstein, MD;  Location: [facility];  Service: General;  Laterality: Left;  incision and drainage of left chest wall abcess  . Bilateral salpingoophorectomy 11/10/11    laparoscopy  . Diagnostic laparoscopy   . Mastectomy 11/10/11    right; w/SNB  . Mastectomy modified radical  10/23/11    left  . Cholecystectomy 1999  . Tubal ligation 1979  . Mastectomy w/ sentinel node biopsy 11/10/2011    Procedure: MASTECTOMY WITH SENTINEL LYMPH NODE BIOPSY;  Surgeon: Shelly Rubenstein, MD;  Location: [facility];  Service: General;  Laterality: Right;    Family History  Problem Relation Age of Onset  . Cancer Mother     lung  . Hypertension Mother   . Cancer Father     lung  . Cancer Brother     BRAIN CANCER  . Hypertension Brother   . Cancer Cousin      2 PATERNAL COUSINS - BREAST CA  . Hypertension Sister   . Hypertension Maternal Uncle   . Hypertension Maternal Grandmother     History  Substance Use Topics  . Smoking status: Former Smoker -- 2.0 packs/day for 28 years    Types: Cigarettes    Quit date: 05/31/1997  . Smokeless tobacco: Never Used  . Alcohol Use: No    OB History    Grav Para Term Preterm Abortions TAB SAB Ect Mult Living   2 2              Review of Systems  Constitutional: Negative for fever.  Respiratory: Positive for shortness of breath. Negative for cough.   Cardiovascular: Positive for chest pain.  Genitourinary: Negative for dysuria.  All other systems reviewed and are negative.    Allergies  Tylox; Morphine and related; Prilosec otc; Gabapentin; and Latex  Home Medications   Current Outpatient Rx  Name Route Sig Dispense Refill  . CLONAZEPAM 0.5 MG PO TABS Oral Take 0.5 mg by mouth at bedtime as needed. For anxiety    . VITAMIN B-12 PO Oral Take 1 tablet by mouth daily.    . CYCLOBENZAPRINE HCL 10 MG PO TABS Oral Take 1 tablet (10 mg total) by mouth 3 (three) times daily as needed for muscle spasms. 30 tablet 0  . HYDROMORPHONE HCL 2 MG PO TABS Oral Take 1-2 tablets (2-4 mg total) by mouth every 4 (four) hours as needed for pain. 30 tablet 0  . IBUPROFEN 200 MG PO TABS Oral Take 600 mg by mouth every 8 (eight) hours as needed. For pain    . LYSINE PO Oral Take 1 tablet by mouth daily.     . OXYCODONE-ACETAMINOPHEN 5-325  MG PO TABS      . POLYETHYLENE GLYCOL 3350 PO PACK Oral Take 17 g by mouth daily as needed. For constipation      BP 129/60  Pulse 104  Temp(Src) 99.3 F (37.4 C) (Oral)  Resp 16  SpO2 95%  Physical Exam  Nursing note and vitals reviewed. Constitutional: She appears well-developed and well-nourished. She appears distressed.  HENT:  Head: Normocephalic and atraumatic.  Right Ear: External ear normal.  Left Ear: External ear normal.  Eyes: Conjunctivae are normal. Right eye exhibits no discharge. Left eye exhibits no discharge. No scleral icterus.  Neck: Neck supple. No tracheal deviation present.  Cardiovascular: Normal rate, regular rhythm and intact distal pulses.   Pulmonary/Chest: Effort normal and breath sounds normal. No stridor. No respiratory distress. She has no wheezes. She has no rales.  Abdominal: Soft. Bowel sounds are normal. She exhibits no distension. There is no tenderness. There is CVA tenderness. There is no rebound and no guarding. No hernia.  Musculoskeletal: She exhibits no edema and no tenderness.       Lumbar back: She exhibits tenderness. She exhibits no swelling and no edema.  Neurological: She is alert. She has normal strength. No sensory deficit. Cranial nerve deficit:  no gross defecits noted. She exhibits normal muscle tone. She displays no seizure activity. Coordination normal.  Skin: Skin is warm and dry. No rash noted.  Psychiatric: She has a normal mood and affect.    ED Course  Procedures (including critical care time)  Rate: 102  Rhythm: sinus tachycardia  QRS Axis: right  Intervals: normal  ST/T Wave abnormalities: normal  Conduction Disutrbances:none  Narrative Interpretation: consider RVH   Old EKG Reviewed: possible RVH is new  Medications  oxyCODONE-acetaminophen (PERCOCET) 5-325 MG per tablet (not administered)  HYDROmorphone (DILAUDID) injection 1 mg (1 mg Intravenous Given 11/20/11 1809)  enoxaparin (LOVENOX) injection 75 mg (not  administered)  iohexol (OMNIPAQUE) 300 MG/ML solution 100 mL (63 mL Intravenous Contrast Given 11/20/11 1758)   CRITICAL CARE Performed by: Linwood Dibbles R Total critical care time: 30 Critical care time was exclusive of separately billable procedures and treating other patients. Critical care was necessary to treat or prevent imminent or life-threatening deterioration. Critical care was time spent personally by me on the following activities: development of treatment plan with patient and/or surrogate as well as nursing, discussions with consultants, evaluation  of patient's response to treatment, examination of patient, obtaining history from patient or surrogate, ordering and performing treatments and interventions, ordering and review of laboratory studies, ordering and review of radiographic studies, pulse oximetry and re-evaluation of patient's condition.  Labs Reviewed  URINALYSIS, ROUTINE W REFLEX MICROSCOPIC - Abnormal; Notable for the following:    Specific Gravity, Urine >1.046 (*)    Hgb urine dipstick SMALL (*)    All other components within normal limits  CBC - Abnormal; Notable for the following:    WBC 10.9 (*)    Hemoglobin 11.3 (*)    HCT 34.2 (*)    All other components within normal limits  COMPREHENSIVE METABOLIC PANEL - Abnormal; Notable for the following:    Glucose, Bld 144 (*)    Albumin 3.2 (*)    All other components within normal limits  URINE MICROSCOPIC-ADD ON   Ct Abdomen Pelvis W Wo Contrast  11/20/2011  *RADIOLOGY REPORT*  Clinical Data: Acute onset back pain, right breast cancer status post mastectomy, history of left mastectomy, total abdominal hysterectomy, and cholecystectomy.  CT ABDOMEN AND PELVIS WITHOUT AND WITH CONTRAST  Technique:  Multidetector CT imaging of the abdomen and pelvis was performed without contrast material in one or both body regions, followed by contrast material(s) and further sections in one or both body regions.  Contrast:  OMNIPAQUE IOHEXOL 300 MG/ML  SOLN  Comparison: PET CT dated 10/10/2010.  Findings: Patchy right lower lobe opacities, suspicious for pneumonia.  Associated small right pleural effusion.  Status post right mastectomy with subcutaneous drain.  Severe hepatic steatosis.  Spleen, pancreas, and adrenal glands within normal limits.  Status post cholecystectomy.  No intrahepatic or extrahepatic ductal dilatation.  Kidneys are within normal limits.  No enhancing renal lesions.  No renal calculi or hydronephrosis.  No evidence of bowel obstruction. Normal appendix.  Moderate stool in the right colon.  No evidence of abdominal aortic aneurysm.  No abdominopelvic ascites.  No suspicious abdominopelvic lymphadenopathy.  Status post hysterectomy.  No adnexal masses.  No ureteral or bladder calculi.  Low-lying bladder/cystocele.  Mild degenerative changes of the visualized thoracolumbar spine. Left partial sacralization of L5.  IMPRESSION: No renal, ureteral, or bladder calculi.  No hydronephrosis.  Normal appendix.  No evidence of bowel obstruction.  Moderate stool in the right colon.  Patchy right lower lobe opacities, suspicious for pneumonia. Associated small right pleural effusion.  Severe hepatic steatosis.  Low-lying bladder/cystocele.  Status post right mastectomy with indwelling surgical drain.  Original Report Authenticated By: Charline Bills, M.D.     1. Pulmonary embolism       MDM  Pt continues to have significant back pain postoperatively.  CT scan of the abdomen pelvis region earlier today showed no evidence of acute abdominal/pelvic abnormality.  Pt does have slight tachycardia and decreased o2 sat.  Will plan on CT of the chest to rule out pulmonary embolism.    6:34 PM ct SCan shows PE.  Pt started on lovenox.  Remains hemodynamically stable.  Will admit to the hospital for further treatment.   Findings discussed with patient and family.  Doubt concurrent pna.        Celene Kras,  MD 11/20/11 [phone]

## 2011-11-20 NOTE — ED Notes (Signed)
Pt reports she was seen by [facility] last night for pain. Pt was given a shot for pain and sent home.  Pt saw MD Henderson Cloud and was sent to [facility] for a CT scan.  Pt was told by MD Henderson Cloud to [facility] for treatment. Pt is unsure what she is being seen for. Pt reports lower central back pain that started suddenly yesterday while sitting and pt describes it as a constant stabbing pain.  Pt had a recent right mastectomy  And ovaries removed last Tuesday. Pt is currently not being treated with chemo. Pt has taken 2mg  dilaudid PO at 1130AM that did not improve pain.

## 2011-11-21 DIAGNOSIS — E663 Overweight: Secondary | ICD-10-CM

## 2011-11-21 DIAGNOSIS — K760 Fatty (change of) liver, not elsewhere classified: Secondary | ICD-10-CM | POA: Diagnosis present

## 2011-11-21 DIAGNOSIS — Z853 Personal history of malignant neoplasm of breast: Secondary | ICD-10-CM

## 2011-11-21 DIAGNOSIS — K5900 Constipation, unspecified: Secondary | ICD-10-CM | POA: Diagnosis present

## 2011-11-21 LAB — CBC
HCT: 31.5 % — ABNORMAL LOW (ref 36.0–46.0)
Hemoglobin: 10.3 g/dL — ABNORMAL LOW (ref 12.0–15.0)
WBC: 10 10*3/uL (ref 4.0–10.5)

## 2011-11-21 LAB — COMPREHENSIVE METABOLIC PANEL
ALT: 19 U/L (ref 0–35)
BUN: 9 mg/dL (ref 6–23)
CO2: 25 mEq/L (ref 19–32)
Calcium: 8.7 mg/dL (ref 8.4–10.5)
Calcium: 8.8 mg/dL (ref 8.4–10.5)
Creatinine, Ser: 0.62 mg/dL (ref 0.50–1.10)
GFR calc Af Amer: >90 mL/min (ref >90)
GFR calc Af Amer: >90 mL/min (ref >90)
GFR calc non Af Amer: >90 mL/min (ref >90)
Glucose, Bld: 126 mg/dL — ABNORMAL HIGH (ref 70–99)
Glucose, Bld: 100 mg/dL — ABNORMAL HIGH (ref 70–99)
Sodium: 136 mEq/L (ref 135–145)
Total Protein: 6.3 g/dL (ref 6.0–8.3)

## 2011-11-21 LAB — CARDIAC PANEL(CRET KIN+CKTOT+MB+TROPI)
CK, MB: 1.3 ng/mL (ref 0.3–4.0)
CK, MB: 1.4 ng/mL (ref 0.3–4.0)
Total CK: 29 U/L (ref 7–177)
Troponin I: <0.3 ng/mL (ref <0.30)
Troponin I: <0.3 ng/mL (ref <0.30)

## 2011-11-21 LAB — PHOSPHORUS: Phosphorus: 3.1 mg/dL (ref 2.3–4.6)

## 2011-11-21 LAB — MAGNESIUM: Magnesium: 1.8 mg/dL (ref 1.5–2.5)

## 2011-11-21 LAB — HEMOGLOBIN A1C: Mean Plasma Glucose: 111 mg/dL (ref <117)

## 2011-11-21 LAB — TSH: TSH: 2.708 u[IU]/mL (ref 0.350–4.500)

## 2011-11-21 MED ORDER — OXYCODONE-ACETAMINOPHEN 5-325 MG PO TABS
1.0000 | ORAL_TABLET | ORAL | Status: DC | PRN
  Administered 2011-11-21 – 2011-11-22 (×4): 2 via ORAL
  Administered 2011-11-22: 1 via ORAL
  Administered 2011-11-22 – 2011-11-25 (×2): 2 via ORAL
  Administered 2011-11-26 (×3): 1 via ORAL
  Filled 2011-11-21: qty 2
  Filled 2011-11-21: qty 1
  Filled 2011-11-21: qty 2
  Filled 2011-11-21 (×2): qty 1
  Filled 2011-11-21: qty 2
  Filled 2011-11-21: qty 1
  Filled 2011-11-21 (×4): qty 2

## 2011-11-21 MED ORDER — WARFARIN VIDEO
Freq: Once | Status: AC
  Administered 2011-11-21: 13:00:00

## 2011-11-21 MED ORDER — KETOROLAC TROMETHAMINE 30 MG/ML IJ SOLN
30.0000 mg | Freq: Four times a day (QID) | INTRAMUSCULAR | Status: AC | PRN
  Administered 2011-11-24 – 2011-11-26 (×3): 30 mg via INTRAVENOUS
  Filled 2011-11-21 (×3): qty 1

## 2011-11-21 MED ORDER — KETOROLAC TROMETHAMINE 30 MG/ML IJ SOLN: 30.0000 mg | Freq: Four times a day (QID) | INTRAMUSCULAR | Status: DC | PRN

## 2011-11-21 MED ORDER — WARFARIN - PHARMACIST DOSING INPATIENT: Freq: Every day | Status: DC

## 2011-11-21 MED ORDER — ENOXAPARIN SODIUM 80 MG/0.8ML ~~LOC~~ SOLN
75.0000 mg | Freq: Two times a day (BID) | SUBCUTANEOUS | Status: AC
  Administered 2011-11-21 – 2011-11-25 (×9): 75 mg via SUBCUTANEOUS
  Filled 2011-11-21 (×9): qty 0.8

## 2011-11-21 MED ORDER — HYDROMORPHONE HCL PF 1 MG/ML IJ SOLN
1.0000 mg | INTRAMUSCULAR | Status: DC | PRN
  Administered 2011-11-21 – 2011-11-25 (×38): 1 mg via INTRAVENOUS
  Filled 2011-11-21 (×38): qty 1

## 2011-11-21 MED ORDER — POLYETHYLENE GLYCOL 3350 17 G PO PACK
17.0000 g | PACK | Freq: Every day | ORAL | Status: DC
  Administered 2011-11-21 – 2011-11-26 (×6): 17 g via ORAL
  Filled 2011-11-21 (×6): qty 1

## 2011-11-21 MED ORDER — HYDROMORPHONE HCL PF 1 MG/ML IJ SOLN
1.0000 mg | INTRAMUSCULAR | Status: DC | PRN
  Administered 2011-11-21: 1 mg via INTRAVENOUS
  Filled 2011-11-21: qty 1

## 2011-11-21 MED ORDER — WARFARIN SODIUM 7.5 MG PO TABS
7.5000 mg | ORAL_TABLET | Freq: Once | ORAL | Status: AC
  Administered 2011-11-21: 7.5 mg via ORAL
  Filled 2011-11-21: qty 1

## 2011-11-21 MED ORDER — ENOXAPARIN SODIUM 80 MG/0.8ML ~~LOC~~ SOLN
75.0000 mg | Freq: Two times a day (BID) | SUBCUTANEOUS | Status: DC
  Filled 2011-11-21 (×2): qty 0.8

## 2011-11-21 MED ORDER — COUMADIN BOOK
Freq: Once | Status: AC
  Administered 2011-11-21: 13:00:00
  Filled 2011-11-21: qty 1

## 2011-11-21 NOTE — Progress Notes (Signed)
ANTICOAGULATION CONSULT NOTE - Initial Consult  Pharmacy Consult for Coumadin Indication: PE  Allergies  Allergen Reactions  . Tylox Nausea And Vomiting  . Morphine And Related Hives and Itching    All over the body  . Prilosec Otc Nausea Only  . Gabapentin Other (See Comments)    Unable to sleep  . Latex Rash    Only where touched    Patient Measurements: Height: 5' 2.99" (160 cm) (as reported on 11/06/11) Weight: 163 lb 9.6 oz (74.208 kg) IBW/kg (Calculated) : 52.38   Vital Signs: Temp: 98.7 F (37.1 C) (04/27 0648) Temp src: Oral (04/27 0648) BP: 132/74 mmHg (04/27 0648) Pulse Rate: 89  (04/27 0648)  Labs:  Basename 11/21/11 0820 11/20/11 2329 11/20/11 1840 11/20/11 1540  HGB 10.3* 10.5* -- --  HCT 31.5* 32.6* -- 34.2*  PLT 158 161 -- 157  APTT -- 48* 34 --  LABPROT -- 15.0 14.3 --  INR -- 1.16 1.09 --  HEPARINUNFRC -- -- -- --  CREATININE 0.67 0.62 -- 0.57  CKTOTAL 26 30 -- --  CKMB 1.3 1.4 -- --  TROPONINI <0.30 <0.30 -- --   Estimated Creatinine Clearance: 76.6 ml/min (by C-G formula based on Cr of 0.67).  Medical History: Past Medical History  Diagnosis Date  . Numbness of feet   . GERD (gastroesophageal reflux disease)   . Chronic low back pain     "everyday"  . Eczema   . Lymphedema of arm     left  . History of radiation therapy 04/23/11 thru 06/08/11    L breast  . Breast wound   . PONV (postoperative nausea and vomiting)   . Migraines   . Neuromuscular disorder     raynauds syndrome   . Shortness of breath on exertion   . Breast cancer 10/23/10    s/p L mastectomy, chemo/radiation, er/pr +, Her2 -  . Breast cancer 11/10/11    S/P right mastectomy  . Anxiety     Medications:  Scheduled:    . enoxaparin (LOVENOX) injection  75 mg Subcutaneous Q12H  . moxifloxacin  400 mg Intravenous Q24H  . polyethylene glycol  17 g Oral Daily  . sodium chloride  3 mL Intravenous Q12H  . vitamin B-12  100 mcg Oral Daily  . DISCONTD: enoxaparin  1  mg/kg Subcutaneous Q12H  . DISCONTD: enoxaparin (LOVENOX) injection  75 mg Subcutaneous Q12H   Infusions:   PRN: albuterol, clonazePAM, cyclobenzaprine, HYDROmorphone (DILAUDID) injection, HYDROmorphone, iohexol, ketorolac, sodium chloride, DISCONTD: HYDROmorphone, DISCONTD: HYDROmorphone, DISCONTD: HYDROmorphone, DISCONTD: polyethylene glycol  Assessment: 55 YOF w/ PMH breast ca, started on Lovenox 1mg /kg q12h (75mg  sq q12h) for PE. Now to start coumadin per pharmacy. Pt is on Avelox which may increase sensitivity to warfarin.  Goal of Therapy:  INR 2-3   Plan:   Coumadin 7.5 mg tonight  Daily INR  Day 1 of 5 day minimum overlap w/ LMWH. Will need 2 day overlap w/ Tx INR (whichever is longer).  Coumadin education book and video, counseling prior to d/c  Gwen Her PharmD  [phone] 11/21/2011 12:25 PM

## 2011-11-21 NOTE — Plan of Care (Signed)
Problem: Phase I Progression Outcomes Goal: Initial discharge plan identified Outcome: Completed/Met Date Met:  11/21/11 To be discharged to home

## 2011-11-21 NOTE — Progress Notes (Addendum)
PROGRESS NOTE  Brandi Bates ZOX:[id number] DOB: Dec 05, 1955 DOA: 11/20/2011 PCP: Gaye Alken, MD, MD  Brief narrative: Brandi Bates is a 23 growth female with a past medical history of left breast carcinoma status post left modified radical mastectomy in March of 2012, right breast cancer with mastectomy and sentinel lymph node biopsy done 11/10/2011 (negative nodes), who presented to the hospital on 11/20/2011 with a chief complaint of back pain and was ultimately diagnosed with acute pulmonary embolus.  Assessment/Plan: Principal Problem:  *Acute pulmonary embolism with back pain  Risk factors for pulmonary embolism include malignancy, treatment with tamoxifen and postoperative status.  With negative sentinel lymph nodes, may be a candidate for Coumadin therapy.  Consulted Dr. Welton Flakes of oncology by telephone to obtain her opinion regarding chronic Lovenox versus Lovenox with transition to Coumadin, and she recommends Lovenox with transition to Coumadin.  Cancel two-dimensional echocardiogram as there was no evidence of right heart strain on 12-lead EKG and cardiac markers x2 are negative.  Continues to have significant back pain so will add Toradol, continue Dilaudid-HP as needed, and add Percocet. Active Problems:  Community acquired pneumonia / Leukocytosis  Pneumonia noted on CT of the chest.  White blood cell count minimally elevated.  Low-grade fever.  On empiric Avelox therapy.  Normocytic anemia  On B12 supplementation.  History of breast cancer  +BRCA mutation.  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 Maintained on tamoxifen post therapy.  Second breast tumor found and is now status post mastectomy and sentinel lymph node biopsy on  11/10/2011 with negative sentinel lymph nodes.  Hepatic steatosis / Overweight  BMI 29.1  Weight loss encouraged.  Constipation  MiraLAX daily, since on pain medications which can cause constipation.  Code Status: Full Family Communication: Daughter updated at bedside. Disposition Plan: Home when stable.  Medical Consultants:  Telephone consultation made with Dr. Drue Second  Other consultants:  Pharmacy  Antibiotics:  Avelox 11/20/11--->   Subjective  Brandi Bates continues to have significant back pain, worse with movement. She is short of breath. No complaints of lower extremity swelling.   Objective    Interim History: Stable overnight.   Objective: Filed Vitals:   11/20/11 2015 11/20/11 2045 11/20/11 2145 11/21/11 0648  BP: 116/50 117/41 138/80 132/74  Pulse: 86 88 93 89  Temp:   99.3 F (37.4 C) 98.7 F (37.1 C)  TempSrc:   Oral Oral  Resp: 26 31 18 26   Height:      Weight:   74.208 kg (163 lb 9.6 oz)   SpO2: 97% 97% 93% 96%    Intake/Output Summary (Last 24 hours) at 11/21/11 1123 Last data filed at 11/21/11 0700  Gross per 24 hour  Intake    490 ml  Output    710 ml  Net   -220 ml    Exam: Gen:  NAD Cardiovascular:  RRR, No M/R/G Respiratory: Lungs diminished Gastrointestinal: Abdomen soft, NT/ND with normal active bowel sounds. Extremities: Trace edema '   Data Reviewed: Basic Metabolic Panel:  Lab 11/21/11 8119 11/20/11 2329 11/20/11 1540  NA 136 134* 137  K 3.8 3.6 --  CL 102 100 101  CO2 26 25 24   GLUCOSE 100* 126* 144*  BUN 9 8 8   CREATININE 0.67 0.62 0.57  CALCIUM 8.8  8.7 9.0  MG -- 1.8 --  PHOS -- 3.1 --   GFR Estimated Creatinine Clearance: 76.6 ml/min (by C-G formula based on Cr of 0.67). Liver Function Tests:  Lab 11/21/11 0820 11/20/11 2329 11/20/11 1540  AST 14 15 18   ALT 19 21 23   ALKPHOS 57 56 59  BILITOT 0.5 0.6 0.6  PROT 6.3 6.3 6.7  ALBUMIN 2.8* 2.9* 3.2*   Coagulation profile  Lab 11/20/11 2329  11/20/11 1840  INR 1.16 1.09  PROTIME -- --    CBC:  Lab 11/21/11 0820 11/20/11 2329 11/20/11 1540  WBC 10.0 11.4* 10.9*  NEUTROABS -- 9.6* --  HGB 10.3* 10.5* 11.3*  HCT 31.5* 32.6* 34.2*  MCV 87.3 86.9 86.4  PLT 158 161 157   Cardiac Enzymes:  Lab 11/21/11 0820 11/20/11 2329  CKTOTAL 26 30  CKMB 1.3 1.4  CKMBINDEX -- --  TROPONINI <0.30 <0.30   Hgb A1c  Basename 11/20/11 2329  HGBA1C 5.5   Thyroid function studies  Basename 11/20/11 2329  TSH 2.708  T4TOTAL --  T3FREE --  THYROIDAB --    Procedures and Diagnostic Studies:  Ct Abdomen Pelvis W Wo Contrast 11/20/2011 IMPRESSION: No renal, ureteral, or bladder calculi.  No hydronephrosis.  Normal appendix.  No evidence of bowel obstruction.  Moderate stool in the right colon.  Patchy right lower lobe opacities, suspicious for pneumonia. Associated small right pleural effusion.  Severe hepatic steatosis.  Low-lying bladder/cystocele.  Status post right mastectomy with indwelling surgical drain.  Original Report Authenticated By: Charline Bills, M.D.    Ct Angio Chest W/cm &/or Wo Cm 11/20/2011  IMPRESSION: The study is positive for acute pulmonary thromboembolism within segmental branches of the right lower lobe.  Right pleural effusion.  Right lower lobe airspace opacities.  Postoperative changes.  Critical Value/emergent results were called by telephone at the time of interpretation on 11/20/2011  at 1811 hours  to  Dr. Lynelle Doctor, who verbally acknowledged these results.  Original Report Authenticated By: Donavan Burnet, M.D.    Scheduled Meds:    . enoxaparin (LOVENOX) injection  75 mg Subcutaneous Q12H  . moxifloxacin  400 mg Intravenous Q24H  . sodium chloride  3 mL Intravenous Q12H  . vitamin B-12  100 mcg Oral Daily  . DISCONTD: enoxaparin  1 mg/kg Subcutaneous Q12H   Continuous Infusions:     LOS: 1 day   Brandi Aldo, MD Pager [phone]  11/21/2011, 11:23 AM   Patient Teaching (information  given to and reviewed with the patient): Brandi Bates 11/21/2011  Treatment team:   Dr. Hillery Bates, Hospitalist (Internist)   Dr. Ruthann Cancer, Oncologist  Medical Issues and plan: Principal Problem:  *Blood clot in the lungs  Risk factors for blood clots include malignancy, treatment with tamoxifen and postoperative status.  With negative sentinel lymph nodes, may be a candidate for Coumadin therapy.  Spoke with Dr. Welton Flakes of oncology by telephone to obtain her opinion regarding the best way to treat this blood clot. She recommends treatment with Lovenox and Coumadin until the Coumadin levels are therapeutic. It typically takes about 5 days on Lovenox before Coumadin levels become therapeutic enough to stop the Lovenox.  No evidence of heart strain on your 12-lead EKG. Your heart enzyme tests were normal which indicates there has not been any heart damage. Active Problems: Possible pneumonia with elevated white blood cells   Pneumonia noted on CT of the chest.  White blood cell count minimally elevated (these cells  grow up in cases of infection or inflammation).  Low-grade fever.  On antibiotics.  Low blood count (anemia)  This can be associated with chronic diseases or nutritional deficiencies.  On B12 supplementation.  History of breast cancer  +BRCA mutation.  Status post left modified radical mastectomy March of 2012, status post chemotherapy and radiation therapy. Maintained on tamoxifen post therapy.  Second breast tumor found and is now status post mastectomy and sentinel lymph node biopsy on 11/10/2011 with negative sentinel lymph nodes.  Fatty liver / Overweight  Fatty liver found on your CT scans incidentally. This can be associated with weight gain.  BMI 29.1  Recommend weight loss.  Constipation  MiraLAX daily, since on pain medications which can cause constipation.   Anticipated discharge date: Depends on whether or not you've can give yourself  Lovenox injections. If you are able to give yourself Lovenox injections, he can likely go home today. If you have are nervous about this, we can teach you how to do it and send her home when you feel comfortable. The worst case scenario is if you are unable to give yourself Lovenox injections, then you would have to stay in the hospital until your Coumadin levels were therapeutic, which can take 5-8 days.

## 2011-11-21 NOTE — Progress Notes (Signed)
Pt shown video on Lovenox and Coumadin.

## 2011-11-22 DIAGNOSIS — I82409 Acute embolism and thrombosis of unspecified deep veins of unspecified lower extremity: Secondary | ICD-10-CM

## 2011-11-22 LAB — PROTIME-INR: INR: 1.25 (ref 0.00–1.49)

## 2011-11-22 LAB — GLUCOSE, CAPILLARY
Glucose-Capillary: 83 mg/dL (ref 70–99)
Glucose-Capillary: 128 mg/dL — ABNORMAL HIGH (ref 70–99)

## 2011-11-22 MED ORDER — WARFARIN SODIUM 7.5 MG PO TABS
7.5000 mg | ORAL_TABLET | Freq: Once | ORAL | Status: AC
  Administered 2011-11-22: 7.5 mg via ORAL
  Filled 2011-11-22: qty 1

## 2011-11-22 MED ORDER — OXYCODONE HCL 10 MG PO TB12
10.0000 mg | ORAL_TABLET | Freq: Two times a day (BID) | ORAL | Status: DC
  Administered 2011-11-22 – 2011-11-25 (×7): 10 mg via ORAL
  Filled 2011-11-22 (×7): qty 1

## 2011-11-22 NOTE — Progress Notes (Signed)
PROGRESS NOTE  Brandi Bates:[id number] DOB: Jul 05, 1956 DOA: 11/20/2011 PCP: Gaye Alken, MD, MD  Brief narrative: Ms. Bates is a 83 growth female with a past medical history of left breast carcinoma status post left modified radical mastectomy in March of 2012, right breast cancer with mastectomy and sentinel lymph node biopsy done 11/10/2011 (negative nodes), who presented to the hospital on 11/20/2011 with a chief complaint of back pain and was ultimately diagnosed with acute pulmonary embolus.  Assessment/Plan: Principal Problem:  *Acute pulmonary embolism with back pain  Risk factors for pulmonary embolism include malignancy, treatment with tamoxifen and postoperative status.  With negative sentinel lymph nodes, may be a candidate for Coumadin therapy.  Consulted Dr. Welton Flakes of oncology by telephone to obtain her opinion regarding chronic Lovenox versus Lovenox with transition to Coumadin, and she recommends Lovenox with transition to Coumadin.  Cancel two-dimensional echocardiogram as there was no evidence of right heart strain on 12-lead EKG and cardiac markers x2 are negative.  Continues to have significant back pain despite Toradol, Dilaudid-HP as needed, and Percocet.  Start OxyContin and check lower extremity dopplers to rule out DVT in the left lower extremity vessels. Active Problems:  Community acquired pneumonia / Leukocytosis  Pneumonia noted on CT of the chest.  White blood cell count minimally elevated on admission, now WNL.  Low-grade fever resolved.  On empiric Avelox therapy.  Normocytic anemia  On B12 supplementation.  History of breast cancer  +BRCA mutation.  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 Maintained on  tamoxifen post therapy.  Second breast tumor found and is now status post mastectomy and sentinel lymph node biopsy on 11/10/2011 with negative sentinel lymph nodes.  Hepatic steatosis / Overweight  BMI 29.1  Weight loss encouraged.  Constipation  MiraLAX daily, since on pain medications which can cause constipation.  Code Status: Full Family Communication: Husband updated at bedside. Disposition Plan: Home when stable.  Medical Consultants:  Telephone consultation made with Dr. Drue Second  Other consultants:  Pharmacy  Antibiotics:  Avelox 11/20/11--->   Subjective  Brandi Bates continues to have significant back pain and RLQ pain, worse with movement. She is short of breath. No complaints of lower extremity swelling.  Agrees to try OxyContin.   Objective    Interim History: Stable overnight.   Objective: Filed Vitals:   11/21/11 0648 11/21/11 1423 11/21/11 2200 11/22/11 0533  BP: 132/74 97/58 121/76 117/75  Pulse: 89 87 87 82  Temp: 98.7 F (37.1 C) 98.9 F (37.2 C) 98.8 F (37.1 C) 98.5 F (36.9 C)  TempSrc: Oral Oral Oral Oral  Resp: 26 21 24 22   Height:      Weight:      SpO2: 96% 96% 96% 96%    Intake/Output Summary (Last 24 hours) at 11/22/11 1121 Last data filed at 11/22/11 1008  Gross per 24 hour  Intake    730 ml  Output   1405 ml  Net   -675 ml    Exam: Gen:  NAD Cardiovascular:  RRR, No M/R/G Respiratory: Lungs diminished Gastrointestinal: Abdomen soft, NT/ND with normal active bowel sounds. Extremities: Trace edema '   Data Reviewed: Basic Metabolic Panel:  Lab 11/21/11 8119 11/20/11 2329 11/20/11 1540  NA 136 134* 137  K 3.8 3.6 --  CL 102 100 101  CO2 26 25 24   GLUCOSE 100* 126* 144*  BUN 9 8 8   CREATININE 0.67 0.62 0.57  CALCIUM 8.8 8.7 9.0  MG -- 1.8 --  PHOS -- 3.1 --   GFR Estimated Creatinine Clearance: 76.6 ml/min (by C-G formula based on Cr of 0.67). Liver Function Tests:  Lab 11/21/11 0820 11/20/11 2329  11/20/11 1540  AST 14 15 18   ALT 19 21 23   ALKPHOS 57 56 59  BILITOT 0.5 0.6 0.6  PROT 6.3 6.3 6.7  ALBUMIN 2.8* 2.9* 3.2*   Coagulation profile  Lab 11/22/11 0620 11/20/11 2329 11/20/11 1840  INR 1.25 1.16 1.09  PROTIME -- -- --    CBC:  Lab 11/21/11 0820 11/20/11 2329 11/20/11 1540  WBC 10.0 11.4* 10.9*  NEUTROABS -- 9.6* --  HGB 10.3* 10.5* 11.3*  HCT 31.5* 32.6* 34.2*  MCV 87.3 86.9 86.4  PLT 158 161 157   Cardiac Enzymes:  Lab 11/21/11 1427 11/21/11 0820 11/20/11 2329  CKTOTAL 29 26 30   CKMB 1.4 1.3 1.4  CKMBINDEX -- -- --  TROPONINI <0.30 <0.30 <0.30   Hgb A1c  Basename 11/20/11 2329  HGBA1C 5.5   Thyroid function studies  Basename 11/20/11 2329  TSH 2.708  T4TOTAL --  T3FREE --  THYROIDAB --    Procedures and Diagnostic Studies:  Ct Abdomen Pelvis W Wo Contrast 11/20/2011 IMPRESSION: No renal, ureteral, or bladder calculi.  No hydronephrosis.  Normal appendix.  No evidence of bowel obstruction.  Moderate stool in the right colon.  Patchy right lower lobe opacities, suspicious for pneumonia. Associated small right pleural effusion.  Severe hepatic steatosis.  Low-lying bladder/cystocele.  Status post right mastectomy with indwelling surgical drain.  Original Report Authenticated By: Charline Bills, M.D.    Ct Angio Chest W/cm &/or Wo Cm 11/20/2011  IMPRESSION: The study is positive for acute pulmonary thromboembolism within segmental branches of the right lower lobe.  Right pleural effusion.  Right lower lobe airspace opacities.  Postoperative changes.  Critical Value/emergent results were called by telephone at the time of interpretation on 11/20/2011  at 1811 hours  to  Dr. Lynelle Doctor, who verbally acknowledged these results.  Original Report Authenticated By: Donavan Burnet, M.D.    Scheduled Meds:    . coumadin book   Does not apply Once  . enoxaparin (LOVENOX) injection  75 mg Subcutaneous Q12H  . moxifloxacin  400 mg Intravenous Q24H  . polyethylene  glycol  17 g Oral Daily  . sodium chloride  3 mL Intravenous Q12H  . vitamin B-12  100 mcg Oral Daily  . warfarin  7.5 mg Oral ONCE-1800  . warfarin   Does not apply Once  . Warfarin - Pharmacist Dosing Inpatient   Does not apply q1800  . DISCONTD: enoxaparin (LOVENOX) injection  75 mg Subcutaneous Q12H   Continuous Infusions:     LOS: 2 days   [name], MD Pager [phone]  11/22/2011, 11:21 AM   Patient Teaching (information given to and reviewed with the patient): Brandi Bates 11/22/2011  Treatment team:   Dr. [name], Hospitalist (Internist)   Dr. Ruthann Cancer, Oncologist  Medical Issues and plan: Principal Problem:  *Blood clot in the lungs  Risk factors for blood clots include malignancy, treatment with tamoxifen and postoperative status.  With negative sentinel lymph nodes, may be a candidate for Coumadin therapy.  Spoke with Dr. Welton Flakes of oncology by telephone to obtain her opinion regarding the best way  to treat this blood clot. She recommends treatment with Lovenox and Coumadin until the Coumadin levels are therapeutic. It typically takes about 5 days on Lovenox before Coumadin levels become therapeutic enough to stop the Lovenox.  Blood test used to evaluate coumadin is called the "INR" and the goal is for this to be between 2-3.  Your INR today is 1.25.  Given education and coumadin instruction by pharmacy staff.  OxyContin started for pain management.    No evidence of heart strain on your 12-lead EKG. Your heart enzyme tests were normal which indicates there has not been any heart damage. Active Problems: Possible pneumonia with elevated white blood cells   Pneumonia noted on CT of the chest.  White blood cell count were minimally elevated (these cells grow up in cases of infection or inflammation) on admission and are normal today.  Low-grade fever.  On antibiotics.  Low blood count (anemia)  This can be associated with chronic  diseases or nutritional deficiencies.  On B12 supplementation.  History of breast cancer  +BRCA mutation.  Status post left modified radical mastectomy March of 2012, status post chemotherapy and radiation therapy. Maintained on tamoxifen post therapy.  Second breast tumor found and is now status post mastectomy and sentinel lymph node biopsy on 11/10/2011 with negative sentinel lymph nodes.  Fatty liver / Overweight  Fatty liver found on your CT scans incidentally. This can be associated with weight gain.  BMI 29.1  Recommend weight loss.  Constipation  MiraLAX daily, since on pain medications which can cause constipation.   Anticipated discharge date: Depends on whether or not you've can give yourself Lovenox injections and pain control.

## 2011-11-22 NOTE — Progress Notes (Signed)
Pt husband taught how to admn. Lovenox INJ. Pt husband demonstrated and gave verbal feedback of steps of INJ .

## 2011-11-22 NOTE — Progress Notes (Signed)
VASCULAR LAB PRELIMINARY  PRELIMINARY  PRELIMINARY  PRELIMINARY  Bilateral lower extremity venous Dopplers completed.    Preliminary report:  There is no DVT or SVT noted in the bilateral lower extremities.  Brandi Bates, 11/22/2011, 2:37 PM

## 2011-11-22 NOTE — Progress Notes (Signed)
ANTICOAGULATION CONSULT NOTE - Initial Consult  Pharmacy Consult for Coumadin Indication: PE  Allergies  Allergen Reactions  . Tylox Nausea And Vomiting    Tolerated Percocet during admission 4/13  . Morphine And Related Hives and Itching    All over the body  . Prilosec Otc Nausea Only  . Gabapentin Other (See Comments)    Unable to sleep  . Latex Rash    Only where touched    Patient Measurements: Height: 5' 2.99" (160 cm) (as reported on 11/06/11) Weight: 163 lb 9.6 oz (74.208 kg) IBW/kg (Calculated) : 52.38   Vital Signs: Temp: 98.5 F (36.9 C) (04/28 0533) Temp src: Oral (04/28 0533) BP: 117/75 mmHg (04/28 0533) Pulse Rate: 82  (04/28 0533)  Labs:  Brandi Bates 11/22/11 0620 11/21/11 1427 11/21/11 0820 11/20/11 2329 11/20/11 1840 11/20/11 1540  HGB -- -- 10.3* 10.5* -- --  HCT -- -- 31.5* 32.6* -- 34.2*  PLT -- -- 158 161 -- 157  APTT -- -- -- 48* 34 --  LABPROT 16.0* -- -- 15.0 14.3 --  INR 1.25 -- -- 1.16 1.09 --  HEPARINUNFRC -- -- -- -- -- --  CREATININE -- -- 0.67 0.62 -- 0.57  CKTOTAL -- 29 26 30  -- --  CKMB -- 1.4 1.3 1.4 -- --  TROPONINI -- <0.30 <0.30 <0.30 -- --   Estimated Creatinine Clearance: 76.6 ml/min (by C-G formula based on Cr of 0.67).  Medical History: Past Medical History  Diagnosis Date  . Numbness of feet   . GERD (gastroesophageal reflux disease)   . Chronic low back pain     "everyday"  . Eczema   . Lymphedema of arm     left  . History of radiation therapy 04/23/11 thru 06/08/11    L breast  . Breast wound   . PONV (postoperative nausea and vomiting)   . Migraines   . Neuromuscular disorder     raynauds syndrome   . Shortness of breath on exertion   . Breast cancer 10/23/10    s/p L mastectomy, chemo/radiation, er/pr +, Her2 -  . Breast cancer 11/10/11    S/P right mastectomy  . Anxiety     Medications:  Scheduled:     . coumadin book   Does not apply Once  . enoxaparin (LOVENOX) injection  75 mg Subcutaneous Q12H  .  moxifloxacin  400 mg Intravenous Q24H  . oxyCODONE  10 mg Oral Q12H  . polyethylene glycol  17 g Oral Daily  . sodium chloride  3 mL Intravenous Q12H  . vitamin B-12  100 mcg Oral Daily  . warfarin  7.5 mg Oral ONCE-1800  . warfarin   Does not apply Once  . Warfarin - Pharmacist Dosing Inpatient   Does not apply q1800   Infusions:   PRN: albuterol, clonazePAM, cyclobenzaprine, HYDROmorphone, ketorolac, oxyCODONE-acetaminophen, sodium chloride, DISCONTD: ketorolac  Assessment:  51 YOF w/ PMH breast ca, started on Lovenox 1mg /kg q12h (75mg  sq q12h) for PE.  Started Coumadin yesterday, INR up after 1 x 7.5mg  yesterday.  Pt is on Avelox which may increase sensitivity to warfarin.   No bleeding reported  Day 2 of 5 day minimum overlap w/ LMWH or will need 2 day overlap w/ Tx INR (whichever is longer).  Goal of Therapy:  INR 2-3   Plan:   Repeat Coumadin 7.5 mg tonight  Daily INR  Cont LMWH + Coumadin for minimum of 5 days or 2 day overlap of INR >=2, whichever is  longer  Coumadin counseling prior to d/c  Gwen Her PharmD  [phone] 11/22/2011 12:04 PM

## 2011-11-23 ENCOUNTER — Telehealth (INDEPENDENT_AMBULATORY_CARE_PROVIDER_SITE_OTHER): Payer: Self-pay | Admitting: Surgery

## 2011-11-23 MED ORDER — MAGNESIUM CITRATE PO SOLN
1.0000 | Freq: Once | ORAL | Status: AC
  Administered 2011-11-23: 1 via ORAL

## 2011-11-23 MED ORDER — GUAIFENESIN ER 600 MG PO TB12
600.0000 mg | ORAL_TABLET | Freq: Two times a day (BID) | ORAL | Status: DC
  Administered 2011-11-23 – 2011-11-26 (×6): 600 mg via ORAL
  Filled 2011-11-23 (×7): qty 1

## 2011-11-23 MED ORDER — WARFARIN SODIUM 5 MG PO TABS
5.0000 mg | ORAL_TABLET | Freq: Once | ORAL | Status: AC
  Administered 2011-11-23: 5 mg via ORAL
  Filled 2011-11-23: qty 1

## 2011-11-23 MED ORDER — HYDROCOD POLST-CHLORPHEN POLST 10-8 MG/5ML PO LQCR: 5.0000 mL | Freq: Two times a day (BID) | ORAL | Status: DC | PRN

## 2011-11-23 NOTE — Telephone Encounter (Signed)
I called the pt back and she states she is in [facility] hospital room 1433 for a blood clot of her lungs.  She was supposed to come in tomorrow for a drain pull but she cancelled.  I told her I would let Dr Magnus Ivan know.  I also left him a voice message.

## 2011-11-23 NOTE — Progress Notes (Signed)
   CARE MANAGEMENT NOTE 11/23/2011  Patient:  Brandi Bates, Brandi Bates   Account Number:  [id number]  Date Initiated:  11/23/2011  Documentation initiated by:  Jiles Crocker  Subjective/Objective Assessment:   ADMITTED WITH PULMONARY EMBOLISM     Action/Plan:   PCP IS DR Zachery Dauer; LIVES AT HOME WITH SPOUSE   Anticipated DC Date:  11/30/2011   Anticipated DC Plan:  HOME/SELF CARE          Status of service:  In process, will continue to follow  Comments:  11/23/2011- B Antawn Sison RN, BSN, MHA

## 2011-11-23 NOTE — Progress Notes (Signed)
ANTICOAGULATION CONSULT NOTE - Follow Up  Pharmacy Consult for Coumadin Indication: PE  Allergies  Allergen Reactions  . Tylox Nausea And Vomiting    Tolerated Percocet during admission 4/13  . Morphine And Related Hives and Itching    All over the body  . Prilosec Otc Nausea Only  . Gabapentin Other (See Comments)    Unable to sleep  . Latex Rash    Only where touched    Patient Measurements: Height: 5' 2.99" (160 cm) (as reported on 11/06/11) Weight: 165 lb 6.4 oz (75.025 kg) IBW/kg (Calculated) : 52.38   Vital Signs: Temp: 99 F (37.2 C) (04/29 0503) Temp src: Oral (04/29 0503) BP: 146/79 mmHg (04/29 0503) Pulse Rate: 89  (04/29 0503)  Labs:  Basename 11/23/11 0500 11/22/11 0620 11/21/11 1427 11/21/11 0820 11/20/11 2329 11/20/11 1840 11/20/11 1540  HGB -- -- -- 10.3* 10.5* -- --  HCT -- -- -- 31.5* 32.6* -- 34.2*  PLT -- -- -- 158 161 -- 157  APTT -- -- -- -- 48* 34 --  LABPROT 19.4* 16.0* -- -- 15.0 -- --  INR 1.61* 1.25 -- -- 1.16 -- --  HEPARINUNFRC -- -- -- -- -- -- --  CREATININE -- -- -- 0.67 0.62 -- 0.57  CKTOTAL -- -- 29 26 30  -- --  CKMB -- -- 1.4 1.3 1.4 -- --  TROPONINI -- -- <0.30 <0.30 <0.30 -- --   Estimated Creatinine Clearance: 77 ml/min (by C-G formula based on Cr of 0.67).  Medical History: Past Medical History  Diagnosis Date  . Numbness of feet   . GERD (gastroesophageal reflux disease)   . Chronic low back pain     "everyday"  . Eczema   . Lymphedema of arm     left  . History of radiation therapy 04/23/11 thru 06/08/11    L breast  . Breast wound   . PONV (postoperative nausea and vomiting)   . Migraines   . Neuromuscular disorder     raynauds syndrome   . Shortness of breath on exertion   . Breast cancer 10/23/10    s/p L mastectomy, chemo/radiation, er/pr +, Her2 -  . Breast cancer 11/10/11    S/P right mastectomy  . Anxiety     Medications:  Scheduled:     . enoxaparin (LOVENOX) injection  75 mg Subcutaneous Q12H  .  moxifloxacin  400 mg Intravenous Q24H  . oxyCODONE  10 mg Oral Q12H  . polyethylene glycol  17 g Oral Daily  . sodium chloride  3 mL Intravenous Q12H  . vitamin B-12  100 mcg Oral Daily  . warfarin  7.5 mg Oral ONCE-1800  . Warfarin - Pharmacist Dosing Inpatient   Does not apply q1800   Infusions:   PRN: albuterol, clonazePAM, cyclobenzaprine, HYDROmorphone, ketorolac, oxyCODONE-acetaminophen, sodium chloride  Assessment:  55 YOF w/ PMH breast ca, started on Lovenox 1mg /kg q12h (75mg  sq q12h) for PE.  INR subtherapeutic but rising after 2 doses of 7.5mg  of warfarin  Pt is on Avelox which may increase sensitivity to warfarin.   No bleeding reported  Day 3 of 5 day minimum overlap w/ LMWH or will need 2 day overlap w/ Tx INR (whichever is longer).  Goal of Therapy:  INR 2-3   Plan:   Coumadin 5mg  tonight  Daily INR  Cont LMWH + Coumadin for minimum of 5 days or 2 day overlap of INR >=2, whichever is longer  Coumadin counseling prior to d/c  Hessie Knows, PharmD, BCPS Pager [phone] 11/23/2011 1:20 PM

## 2011-11-23 NOTE — Progress Notes (Signed)
PROGRESS NOTE  Brandi Bates:[id number] DOB: Sep 19, 1955 DOA: 11/20/2011 PCP: Gaye Alken, MD, MD  Brief narrative: Ms. Brandi Bates is a 40 growth female with a past medical history of left breast carcinoma status post left modified radical mastectomy in March of 2012, right breast cancer with mastectomy and sentinel lymph node biopsy done 11/10/2011 (negative nodes), who presented to the hospital on 11/20/2011 with a chief complaint of back pain and was ultimately diagnosed with acute pulmonary embolus.  Assessment/Plan: Principal Problem:  *Acute pulmonary embolism with back pain  Risk factors for pulmonary embolism include malignancy, treatment with tamoxifen and postoperative status.  With negative sentinel lymph nodes, may be a candidate for Coumadin therapy.  Consulted Dr. Welton Flakes of oncology by telephone to obtain her opinion regarding chronic Lovenox versus Lovenox with transition to Coumadin, and she recommends Lovenox with transition to Coumadin.  Cancel two-dimensional echocardiogram as there was no evidence of right heart strain on 12-lead EKG and cardiac markers x2 are negative.  Continues to have significant back pain despite Toradol, Dilaudid-HP as needed, and Percocet.  Started OxyContin 11/22/11 for better pain control.  Lower extremity dopplers to rule out DVT in the left lower extremity vessels performed 11/22/11: Negative. Active Problems:  Community acquired pneumonia / Leukocytosis  Pneumonia noted on CT of the chest.  White blood cell count minimally elevated on admission, now WNL.  Low-grade fever resolved.  On empiric Avelox therapy.  Normocytic anemia  On B12 supplementation.  History of breast cancer  +BRCA mutation.  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 Maintained on tamoxifen post therapy.  Second breast tumor found and is now status post mastectomy and sentinel lymph node biopsy on 11/10/2011 with negative sentinel lymph nodes.  Hepatic steatosis / Overweight  BMI 29.1  Weight loss encouraged.  Constipation  MiraLAX daily, since on pain medications which can cause constipation.  Mag citrate today for ongoing constipation.  Code Status: Full Family Communication: Family updated at bedside. Disposition Plan: Home when stable.  Medical Consultants:  Telephone consultation made with Dr. Drue Second  Other consultants:  Pharmacy  Antibiotics:  Avelox 11/20/11--->   Subjective  [name] continues to have significant back pain that is aggravated with movement.  She has only been able to ambulate to the [facility].  No nausea or vomiting.  Constipated.   Objective    Interim History: Stable overnight.   Objective: Filed Vitals:   11/22/11 2310 11/23/11 0105 11/23/11 0503 11/23/11 1449  BP:   146/79 126/69  Pulse:   89 83  Temp: 99.1 F (37.3 C) 98.5 F (36.9 C) 99 F (37.2 C) 98.8 F (37.1 C)  TempSrc: Oral Oral Oral Oral  Resp:   22 18  Height:      Weight:   75.025 kg (165 lb 6.4 oz)   SpO2:   96% 98%    Intake/Output Summary (Last 24 hours) at 11/23/11 1526 Last data filed at 11/23/11 1454  Gross per 24 hour  Intake    730 ml  Output   2680 ml  Net  -1950 ml    Exam: Gen:  NAD Cardiovascular:  RRR, No M/R/G Respiratory: Lungs diminished Gastrointestinal: Abdomen soft, NT/ND with normal active bowel sounds. Extremities: Trace edema '   Data Reviewed: Basic Metabolic Panel:  Lab 11/21/11 0820 11/20/11 2329 11/20/11 1540  NA 136 134* 137  K 3.8 3.6 --  CL 102 100 101  CO2 26 25 24   GLUCOSE 100* 126* 144*  BUN 9 8 8   CREATININE 0.67 0.62 0.57  CALCIUM 8.8 8.7 9.0  MG -- 1.8 --  PHOS -- 3.1 --   GFR Estimated Creatinine Clearance: 77 ml/min (by C-G  formula based on Cr of 0.67). Liver Function Tests:  Lab 11/21/11 0820 11/20/11 2329 11/20/11 1540  AST 14 15 18   ALT 19 21 23   ALKPHOS 57 56 59  BILITOT 0.5 0.6 0.6  PROT 6.3 6.3 6.7  ALBUMIN 2.8* 2.9* 3.2*   Coagulation profile  Lab 11/23/11 0500 11/22/11 0620 11/20/11 2329 11/20/11 1840  INR 1.61* 1.25 1.16 1.09  PROTIME -- -- -- --    CBC:  Lab 11/21/11 0820 11/20/11 2329 11/20/11 1540  WBC 10.0 11.4* 10.9*  NEUTROABS -- 9.6* --  HGB 10.3* 10.5* 11.3*  HCT 31.5* 32.6* 34.2*  MCV 87.3 86.9 86.4  PLT 158 161 157   Cardiac Enzymes:  Lab 11/21/11 1427 11/21/11 0820 11/20/11 2329  CKTOTAL 29 26 30   CKMB 1.4 1.3 1.4  CKMBINDEX -- -- --  TROPONINI <0.30 <0.30 <0.30   Hgb A1c  Basename 11/20/11 2329  HGBA1C 5.5   Thyroid function studies  Basename 11/20/11 2329  TSH 2.708  T4TOTAL --  T3FREE --  THYROIDAB --    Procedures and Diagnostic Studies:  Ct Abdomen Pelvis W Wo Contrast 11/20/2011 IMPRESSION: No renal, ureteral, or bladder calculi.  No hydronephrosis.  Normal appendix.  No evidence of bowel obstruction.  Moderate stool in the right colon.  Patchy right lower lobe opacities, suspicious for pneumonia. Associated small right pleural effusion.  Severe hepatic steatosis.  Low-lying bladder/cystocele.  Status post right mastectomy with indwelling surgical drain.  Original Report Authenticated By: Charline Bills, M.D.    Ct Angio Chest W/cm &/or Wo Cm 11/20/2011  IMPRESSION: The study is positive for acute pulmonary thromboembolism within segmental branches of the right lower lobe.  Right pleural effusion.  Right lower lobe airspace opacities.  Postoperative changes.  Critical Value/emergent results were called by telephone at the time of interpretation on 11/20/2011  at 1811 hours  to  Dr. Lynelle Doctor, who verbally acknowledged these results.  Original Report Authenticated By: Donavan Burnet, M.D.    Scheduled Meds:    . enoxaparin (LOVENOX) injection  75 mg  Subcutaneous Q12H  . moxifloxacin  400 mg Intravenous Q24H  . oxyCODONE  10 mg Oral Q12H  . polyethylene glycol  17 g Oral Daily  . sodium chloride  3 mL Intravenous Q12H  . vitamin B-12  100 mcg Oral Daily  . warfarin  5 mg Oral ONCE-1800  . warfarin  7.5 mg Oral ONCE-1800  . Warfarin - Pharmacist Dosing Inpatient   Does not apply q1800   Continuous Infusions:     LOS: 3 days   [name], MD Pager [phone]  11/23/2011, 3:26 PM   Patient Teaching (information given to and reviewed with the patient): CHANON LONEY 11/23/2011  Treatment team:   Dr. [name], Hospitalist (Internist)   Dr. Ruthann Cancer, Oncologist  Medical Issues and plan: Principal Problem:  *Blood clot in the lungs  Risk factors for blood clots include malignancy, treatment with tamoxifen and postoperative status.  With negative sentinel lymph nodes, may be a candidate for Coumadin therapy.  Spoke with Dr. Welton Flakes of oncology by telephone  to obtain her opinion regarding the best way to treat this blood clot. She recommends treatment with Lovenox and Coumadin until the Coumadin levels are therapeutic. It typically takes about 5 days on Lovenox before Coumadin levels become therapeutic enough to stop the Lovenox.  Blood test used to evaluate coumadin is called the "INR" and the goal is for this to be between 2-3.  Your INR today is 1.61.  Given education and coumadin instruction by pharmacy staff.  OxyContin started for pain management.    No evidence of heart strain on your 12-lead EKG. Your heart enzyme tests were normal which indicates there has not been any heart damage, so no need to do Echo at this time.  Dopplers of your legs done on or/28/2013 and these were negative for any blood clots in the legs. Active Problems: Possible pneumonia with elevated white blood cells   Pneumonia noted on CT of the chest.  White blood cell count were minimally elevated (these cells grow up in  cases of infection or inflammation) on admission and are normal today.  Low-grade fever.  On antibiotics.  Low blood count (anemia)  This can be associated with chronic diseases or nutritional deficiencies.  On B12 supplementation.  History of breast cancer  +BRCA mutation.  Status post left modified radical mastectomy March of 2012, status post chemotherapy and radiation therapy. Maintained on tamoxifen post therapy.  Second breast tumor found and is now status post mastectomy and sentinel lymph node biopsy on 11/10/2011 with negative sentinel lymph nodes.  Fatty liver / Overweight  Fatty liver found on your CT scans incidentally. This can be associated with weight gain.  BMI 29.1  Recommend weight loss.  Constipation  MiraLAX daily, since on pain medications which can cause constipation.  We'll try mag citrate today.   Anticipated discharge date: Depends on whether or not you've can give yourself Lovenox injections and pain control.

## 2011-11-24 ENCOUNTER — Encounter (INDEPENDENT_AMBULATORY_CARE_PROVIDER_SITE_OTHER): Payer: BC Managed Care – PPO | Admitting: Surgery

## 2011-11-24 LAB — GLUCOSE, CAPILLARY
Glucose-Capillary: 76 mg/dL (ref 70–99)
Glucose-Capillary: 94 mg/dL (ref 70–99)

## 2011-11-24 MED ORDER — WARFARIN SODIUM 2.5 MG PO TABS
2.5000 mg | ORAL_TABLET | Freq: Once | ORAL | Status: AC
  Administered 2011-11-24: 2.5 mg via ORAL
  Filled 2011-11-24: qty 1

## 2011-11-24 MED ORDER — MOXIFLOXACIN HCL 400 MG PO TABS
400.0000 mg | ORAL_TABLET | Freq: Every day | ORAL | Status: DC
  Administered 2011-11-24 – 2011-11-25 (×2): 400 mg via ORAL
  Filled 2011-11-24 (×3): qty 1

## 2011-11-24 NOTE — Plan of Care (Signed)
Problem: Phase I Progression Outcomes Goal: Dyspnea controlled at rest Outcome: Completed/Met Date Met:  11/24/11 Pt had diagnosis of PE.

## 2011-11-24 NOTE — Progress Notes (Signed)
Received CM referral to arrange [facility]; [facility] called [phone]- was informed that the Oncologist are the only ones that can order for a patient to be seen at the coumadin clinic, message left with Valerie/ Dr Darnelle Catalan nurse for arrangements; awaiting a callback; B Ave Filter RN, BSN, [place]

## 2011-11-24 NOTE — Progress Notes (Signed)
PROGRESS NOTE  Brandi Bates OZH:[id number] DOB: 1955/08/06 DOA: 11/20/2011 PCP: Gaye Alken, MD, MD  Brief narrative: Ms. Blane is a 60 growth female with a past medical history of left breast carcinoma status post left modified radical mastectomy in March of 2012, right breast cancer with mastectomy and sentinel lymph node biopsy done 11/10/2011 (negative nodes), who presented to the hospital on 11/20/2011 with a chief complaint of back pain and was ultimately diagnosed with acute pulmonary embolus.  Assessment/Plan: Principal Problem:  *Acute pulmonary embolism with back pain  Risk factors for pulmonary embolism include malignancy, treatment with tamoxifen and postoperative status.  With negative sentinel lymph nodes, may be a candidate for Coumadin therapy.  Consulted Dr. Welton Flakes of oncology by telephone to obtain her opinion regarding chronic Lovenox versus Lovenox with transition to Coumadin, and she recommends Lovenox with transition to Coumadin since the patient has no evidence of metastatic disease.  Cancelled two-dimensional echocardiogram as there was no evidence of right heart strain on 12-lead EKG and cardiac markers x2 are negative.  Continues to have significant back pain despite Toradol, Dilaudid-HP as needed, and Percocet.  Started OxyContin 11/22/11 for better pain control.  Lower extremity dopplers to rule out DVT in the left lower extremity vessels performed 11/22/11: Negative.  INR therapeutic.  Can d/c Lovenox after tomorrow's doses if she remains therapeutic.  Care management consulted for coumadin clinic referral. Active Problems:  Community acquired pneumonia / Leukocytosis  Pneumonia noted on CT of the chest.  White blood cell count minimally elevated on admission, now WNL.  Low-grade fever resolved.  On empiric Avelox therapy.  Normocytic anemia  On B12 supplementation.  History of breast cancer  +BRCA mutation.  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 Maintained on tamoxifen post therapy.  Second breast tumor found and is now status post mastectomy and sentinel lymph node biopsy on 11/10/2011 with negative sentinel lymph nodes.  Follow up with Dr. Darnelle Catalan post discharge.  Hepatic steatosis / Overweight  BMI 29.1  Weight loss encouraged.  Constipation  MiraLAX daily, since on pain medications which can cause constipation.  Mag citrate given 11/23/11 with good results.  Code Status: Full Family Communication: Family updated at bedside. Disposition Plan: Home when stable.  Medical Consultants:  Telephone consultation made with Dr. Drue Second  Other consultants:  Pharmacy  Physical Therapy  Occupational Therapy  Antibiotics:  Avelox 11/20/11--->   Subjective  Brandi Bates continues to have significant back pain that is aggravated with movement, but has improved.  Some nausea but no vomiting.  Appetite remains poor.  Bowels moved yesterday with magnesium citrate.   Objective    Interim History: Stable overnight.   Objective: Filed Vitals:   11/23/11 1449 11/23/11 2208 11/24/11 0500 11/24/11 0600  BP: 126/69 113/70  137/77  Pulse: 83 82  85  Temp: 98.8 F (37.1 C) 98.8 F (37.1 C)  98.4 F (36.9 C)  TempSrc: Oral Oral  Oral  Resp: 18 20  19   Height:      Weight:   75.569 kg (166 lb 9.6 oz)   SpO2: 98% 97%  96%    Intake/Output Summary (Last 24 hours) at 11/24/11 1336 Last data filed at 11/24/11 0600  Gross per 24 hour  Intake    730 ml  Output    225 ml  Net    505 ml    Exam: Gen:  NAD Cardiovascular:  RRR, No M/R/G Respiratory: Lungs diminished Gastrointestinal: Abdomen soft, NT/ND with normal active bowel sounds. Extremities: Trace  edema '   Data Reviewed: Basic Metabolic Panel:  Lab 11/21/11 1610 11/20/11 2329 11/20/11 1540  NA 136 134* 137  K 3.8 3.6 --  CL 102 100 101  CO2 26 25 24   GLUCOSE 100* 126* 144*  BUN 9 8 8   CREATININE 0.67 0.62 0.57  CALCIUM 8.8 8.7 9.0  MG -- 1.8 --  PHOS -- 3.1 --   GFR Estimated Creatinine Clearance: 77.4 ml/min (by C-G formula based on Cr of 0.67). Liver Function Tests:  Lab 11/21/11 0820 11/20/11 2329 11/20/11 1540  AST 14 15 18   ALT 19 21 23   ALKPHOS 57 56 59  BILITOT 0.5 0.6 0.6  PROT 6.3 6.3 6.7  ALBUMIN 2.8* 2.9* 3.2*   Coagulation profile  Lab 11/24/11 0530 11/23/11 0500 11/22/11 0620 11/20/11 2329 11/20/11 1840  INR 2.15* 1.61* 1.25 1.16 1.09  PROTIME -- -- -- -- --    CBC:  Lab 11/21/11 0820 11/20/11 2329 11/20/11 1540  WBC 10.0 11.4* 10.9*  NEUTROABS -- 9.6* --  HGB 10.3* 10.5* 11.3*  HCT 31.5* 32.6* 34.2*  MCV 87.3 86.9 86.4  PLT 158 161 157   Cardiac Enzymes:  Lab 11/21/11 1427 11/21/11 0820 11/20/11 2329  CKTOTAL 29 26 30   CKMB 1.4 1.3 1.4  CKMBINDEX -- -- --  TROPONINI <0.30 <0.30 <0.30    Procedures and Diagnostic Studies:  Ct Abdomen Pelvis W Wo Contrast 11/20/2011 IMPRESSION: No renal, ureteral, or bladder calculi.  No hydronephrosis.  Normal appendix.  No evidence of bowel obstruction.  Moderate stool in the right colon.  Patchy right lower lobe opacities, suspicious for pneumonia. Associated small right pleural effusion.  Severe hepatic steatosis.  Low-lying bladder/cystocele.  Status post right mastectomy with indwelling surgical drain.  Original Report Authenticated By: Charline Bills, M.D.    Ct Angio Chest W/cm &/or Wo Cm 11/20/2011  IMPRESSION: The study is positive for acute pulmonary thromboembolism within segmental branches of the right lower lobe.  Right pleural effusion.  Right lower lobe airspace opacities.  Postoperative changes.  Critical Value/emergent results were called by telephone at the time of interpretation on  11/20/2011  at 1811 hours  to  Dr. Lynelle Doctor, who verbally acknowledged these results.  Original Report Authenticated By: Donavan Burnet, M.D.    Scheduled Meds:    . enoxaparin (LOVENOX) injection  75 mg Subcutaneous Q12H  . guaiFENesin  600 mg Oral BID  . magnesium citrate  1 Bottle Oral Once  . moxifloxacin  400 mg Oral q1800  . oxyCODONE  10 mg Oral Q12H  . polyethylene glycol  17 g Oral Daily  . sodium chloride  3 mL Intravenous Q12H  . vitamin B-12  100 mcg Oral Daily  . warfarin  2.5 mg Oral ONCE-1800  . warfarin  5 mg Oral ONCE-1800  . Warfarin - Pharmacist Dosing Inpatient   Does not apply q1800  . DISCONTD: moxifloxacin  400 mg Intravenous Q24H   Continuous Infusions:     LOS: 4 days   [name], MD Pager [phone]  11/24/2011, 1:36 PM   Patient Teaching (information given to and reviewed with the patient): Brandi Bates 11/24/2011  Treatment team:   Dr. [name], Hospitalist (Internist)   Dr. Ruthann Cancer, Oncologist  Medical  Issues and plan: Principal Problem:  *Blood clot in the lungs  Risk factors for blood clots include malignancy, treatment with tamoxifen and postoperative status.  With negative sentinel lymph nodes, may be a candidate for Coumadin therapy.  Spoke with Dr. Welton Flakes of oncology by telephone to obtain her opinion regarding the best way to treat this blood clot. She recommends treatment with Lovenox and Coumadin until the Coumadin levels are therapeutic. It typically takes about 5 days on Lovenox before Coumadin levels become therapeutic enough to stop the Lovenox.  Blood test used to evaluate coumadin is called the "INR" and the goal is for this to be between 2-3.  Your INR today is 2.15.  We can likely stop your lovenox after tomorrow's doses..  Given education and coumadin instruction by pharmacy staff.  OxyContin started for pain management.    No evidence of heart strain on your 12-lead EKG. Your heart enzyme tests  were normal which indicates there has not been any heart damage, so no need to do Echo at this time.  Dopplers of your legs done on or/28/2013 and these were negative for any blood clots in the legs. Active Problems: Possible pneumonia with elevated white blood cells   Pneumonia noted on CT of the chest.  White blood cell count were minimally elevated (these cells grow up in cases of infection or inflammation) on admission and are normal today.  Low-grade fever.  On antibiotics.  Low blood count (anemia)  This can be associated with chronic diseases or nutritional deficiencies.  On B12 supplementation.  History of breast cancer  +BRCA mutation.  Status post left modified radical mastectomy March of 2012, status post chemotherapy and radiation therapy. Maintained on tamoxifen post therapy.  Second breast tumor found and is now status post mastectomy and sentinel lymph node biopsy on 11/10/2011 with negative sentinel lymph nodes.  Fatty liver / Overweight  Fatty liver found on your CT scans incidentally. This can be associated with weight gain.  BMI 29.1  Recommend weight loss.  Constipation  MiraLAX daily, since on pain medications which can cause constipation.  Mag citrate given 11/24/11.   Anticipated discharge date: Today or tomorrow depending on pain control, ability to get around.  Will wait for physical therapy evaluation before we make a decision.

## 2011-11-24 NOTE — Evaluation (Signed)
Physical Therapy Evaluation Patient Details Name: Brandi Bates MRN: [id number] DOB: Jul 24, 1956 Today's Date: 11/24/2011 Time: 1405-1430 PT Time Calculation (min): 25 min  PT Assessment / Plan / Recommendation Clinical Impression  56 yo female with bilateral mastecomies admitted with PE.  She is limited by decreased O2 sats  and increasing back and lower right lat chest pain with activity.  She is assisted by use of RW for gait and does need supplemental O2.  She would benefit from HHPT to work on increase balance to return to ambulation without devise, work toward weaning of O2 and improve shoulder ROM post mastectomies. Pt will benefit from temporary use of RW at home    PT Assessment  Patient needs continued PT services    Follow Up Recommendations  Home health PT    Equipment Recommendations  Rolling walker with 5" wheels    Frequency Min 3X/week    Precautions / Restrictions Restrictions Weight Bearing Restrictions: No   Pertinent Vitals/Pain Pain in back/ lower right chest. O2 sat 85% on RA with activity      Mobility  Bed Mobility Bed Mobility: Not assessed (pt/grandaughter in room and pt got up to the bathroom ) Details for Bed Mobility Assistance: pt hs episodes of increase in coughing and increased pain with moblity Transfers Transfers: Sit to Stand;Stand to Sit Sit to Stand: 7: Independent Stand to Sit: 7: Independent Ambulation/Gait Ambulation/Gait Assistance: 5: Supervision Ambulation Distance (Feet): 100 Feet Assistive device: Rolling walker;Other (Comment) (supplemental O2) Ambulation/Gait Assistance Details: pt is able to achieve good posture and activate glutes and abds, but needs RW for confidence and stability in gait.  She continues to have coughing with activity and has increased back pain with mobility Gait Pattern: Step-through pattern Gait velocity: decreased Stairs: No Wheelchair Mobility Wheelchair Mobility: No    Exercises     PT Goals Acute  Rehab PT Goals PT Goal Formulation: With patient Time For Goal Achievement: 12/08/11 Potential to Achieve Goals: Good Pt will Ambulate: >150 feet;Independently;Other (comment) (with O2 sats> 90% on RA) PT Goal: Ambulate - Progress: Goal set today Pt will Go Up / Down Stairs: 3-5 stairs;with modified independence PT Goal: Up/Down Stairs - Progress: Goal set today Pt will Perform Home Exercise Program: Independently PT Goal: Perform Home Exercise Program - Progress: Goal set today  Visit Information  Last PT Received On: 11/24/11 Assistance Needed: +1 PT/OT Co-Evaluation/Treatment: Yes    Subjective Data  Subjective: pt has most problems from pain in back that increases with mobility Patient Stated Goal: not to go home until pain is better   Prior Functioning  Home Living Lives With: Family Available Help at Discharge: Family Type of Home: House Home Access: Stairs to enter Secretary/administrator of Steps: 5 Entrance Stairs-Rails: Right;Left;Can reach both Home Layout: One level Prior Function Level of Independence: Independent Able to Take Stairs?: Yes Comments: pt had left mastectomy last march with chemo/radiation and then 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 and still is not healed per patient Communication Communication: No difficulties    Cognition  Overall Cognitive Status: Appears within functional limits for tasks assessed/performed Arousal/Alertness: Awake/alert Orientation Level: Oriented X4 / Intact Behavior During Session: [facility] for tasks performed    Extremity/Trunk Assessment Left Upper Extremity Assessment LUE ROM/Strength/Tone: [facility] for tasks assessed Right Lower Extremity Assessment RLE ROM/Strength/Tone: [facility] for tasks assessed Left Lower Extremity Assessment LLE ROM/Strength/Tone: [facility] for tasks assessed   Balance Balance Balance Assessed: No  End of Session  PT - End of Session Activity Tolerance: Patient limited by  fatigue;Treatment limited secondary to medical complications (Comment) Patient left: in chair;with nursing in room;with family/visitor present Nurse Communication: Other (comment) (need for supplemental O2 for mobility)   Donnetta Hail 11/24/2011, 2:54 PM

## 2011-11-24 NOTE — Progress Notes (Addendum)
ANTICOAGULATION CONSULT NOTE - Follow Up  Pharmacy Consult for Coumadin Indication: PE  Allergies  Allergen Reactions  . Oxycodone-Acetaminophen Nausea And Vomiting    Tolerated Percocet during admission 4/13  . Morphine And Related Hives and Itching    All over the body  . Omeprazole Magnesium Nausea Only  . Gabapentin Other (See Comments)    Unable to sleep  . Latex Rash    Only where touched    Patient Measurements: Height: 5' 2.99" (160 cm) (as reported on 11/06/11) Weight: 166 lb 9.6 oz (75.569 kg) IBW/kg (Calculated) : 52.38   Vital Signs: Temp: 98.4 F (36.9 C) (04/30 0600) Temp src: Oral (04/30 0600) BP: 137/77 mmHg (04/30 0600) Pulse Rate: 85  (04/30 0600)  Labs:  Basename 11/24/11 0530 11/23/11 0500 11/22/11 0620 11/21/11 1427 11/21/11 0820  HGB -- -- -- -- 10.3*  HCT -- -- -- -- 31.5*  PLT -- -- -- -- 158  APTT -- -- -- -- --  LABPROT 24.4* 19.4* 16.0* -- --  INR 2.15* 1.61* 1.25 -- --  HEPARINUNFRC -- -- -- -- --  CREATININE -- -- -- -- 0.67  CKTOTAL -- -- -- 29 26  CKMB -- -- -- 1.4 1.3  TROPONINI -- -- -- <0.30 <0.30   Estimated Creatinine Clearance: 77.4 ml/min (by C-G formula based on Cr of 0.67).  Medical History: Past Medical History  Diagnosis Date  . Numbness of feet   . GERD (gastroesophageal reflux disease)   . Chronic low back pain     "everyday"  . Eczema   . Lymphedema of arm     left  . History of radiation therapy 04/23/11 thru 06/08/11    L breast  . Breast wound   . PONV (postoperative nausea and vomiting)   . Migraines   . Neuromuscular disorder     raynauds syndrome   . Shortness of breath on exertion   . Breast cancer 10/23/10    s/p L mastectomy, chemo/radiation, er/pr +, Her2 -  . Breast cancer 11/10/11    S/P right mastectomy  . Anxiety     Medications:  Scheduled:     . enoxaparin (LOVENOX) injection  75 mg Subcutaneous Q12H  . guaiFENesin  600 mg Oral BID  . magnesium citrate  1 Bottle Oral Once  .  moxifloxacin  400 mg Intravenous Q24H  . oxyCODONE  10 mg Oral Q12H  . polyethylene glycol  17 g Oral Daily  . sodium chloride  3 mL Intravenous Q12H  . vitamin B-12  100 mcg Oral Daily  . warfarin  5 mg Oral ONCE-1800  . Warfarin - Pharmacist Dosing Inpatient   Does not apply q1800   Infusions:   PRN: albuterol, chlorpheniramine-HYDROcodone, clonazePAM, cyclobenzaprine, HYDROmorphone, ketorolac, oxyCODONE-acetaminophen, sodium chloride  Assessment:  55 YOF w/ PMH breast ca, started on Lovenox 1mg /kg q12h (75mg  sq q12h) for PE.  INR now therapeutic after 3rd dose of warfarin - 2 doses of 7.5mg  and 1 dose of 5mg  yesterday  Pt is on Avelox which may increase sensitivity to warfarin.   No bleeding reported  Day 4 of 5 day minimum overlap w/ LMWH or will need 2 day overlap w/ Tx INR (whichever is longer).  Goal of Therapy:  INR 2-3   Plan:   Expect patient will likely need a dose less than 5mg  daily but could potentially be an effect of the concurrent Avelox as well - 2.5mg  tonight  Cont LMWH + Coumadin for 2 more  days and d/c after tomorrow's doses if INR > 2 again as expected  Warfarin education completed yesterday 4/29 - answered patient's questions and she verbalized understanding  Note per protocol, have changed IV Avelox to PO   Hessie Knows, PharmD, BCPS Pager [phone] 11/24/2011 7:43 AM

## 2011-11-24 NOTE — Evaluation (Signed)
Occupational Therapy Evaluation Patient Details Name: Brandi Bates MRN: [id number] DOB: 03-Feb-1956 Today's Date: 11/24/2011 Time: 4098-1191 OT Time Calculation (min): 27 min  OT Assessment / Plan / Recommendation Clinical Impression  Pt presents to OT with decreased I with ADL activity due to pain. Pt will benefit from skilled OT to increase I with ADL activity and return to PLOF. Pt may benefit from HHOT to address ROM and strength BUE as both are limited s/p masectomies     OT Assessment  Patient needs continued OT Services       Equipment Recommendations  Rolling walker with 5" wheels       Precautions / Restrictions Precautions Precautions: Fall Restrictions Weight Bearing Restrictions: No       ADL  Grooming: Simulated;Set up Where Assessed - Grooming: Supported sitting Upper Body Bathing: Simulated;Minimal assistance Where Assessed - Upper Body Bathing: Sitting, chair;Unsupported Lower Body Bathing: Simulated;Minimal assistance Where Assessed - Lower Body Bathing: Sit to stand from chair Upper Body Dressing: Simulated;Minimal assistance Where Assessed - Upper Body Dressing: Sitting, chair;Supported Lower Body Dressing: Simulated;Minimal assistance Where Assessed - Lower Body Dressing: Sit to stand from chair Toilet Transfer: Performed;Minimal assistance Toilet Transfer Method: Proofreader: Regular height toilet;Grab bars Toileting - Clothing Manipulation: Simulated;Minimal assistance Where Assessed - Glass blower/designer Manipulation: Standing Toileting - Hygiene: Simulated;Minimal assistance Where Assessed - Toileting Hygiene: Standing Tub/Shower Transfer: Not assessed Tub/Shower Transfer Method: Not assessed Ambulation Related to ADLs: Pt in significant pain with coughing and moving about.  Getting OOB seemed to increase pts coughing and pain. RN aware. ADL Comments: Has help 24/7 with ADL activity    OT Goals Acute Rehab OT Goals OT  Goal Formulation: With patient ADL Goals Pt Will Perform Grooming: with modified independence;Standing at sink ADL Goal: Grooming - Progress: Goal set today Pt Will Transfer to Toilet: with modified independence;Comfort height toilet ADL Goal: Toilet Transfer - Progress: Goal set today Arm Goals Additional Arm Goal #1: Pt will perform BUE exercise as able to increase AROM and maintain joint mobility and integrity with S  Visit Information  Assistance Needed: +1 PT/OT Co-Evaluation/Treatment: Yes       Prior Functioning  Home Living Lives With: Family Available Help at Discharge: Family Type of Home: House Home Access: Stairs to enter Secretary/administrator of Steps: 5 Entrance Stairs-Rails: Right;Left;Can reach both Home Layout: One level Prior Function Level of Independence: Independent Able to Take Stairs?: Yes Comments: pt had left mastectomy last march with chemo/radiation and then 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 and still is not healed per patient Communication Communication: No difficulties    Cognition  Overall Cognitive Status: Appears within functional limits for tasks assessed/performed Arousal/Alertness: Awake/alert Orientation Level: Appears intact for tasks assessed Behavior During Session: [facility] for tasks performed    Extremity/Trunk Assessment Right Upper Extremity Assessment RUE ROM/Strength/Tone: Deficits;Unable to fully assess RUE ROM/Strength/Tone Deficits: Pts focus this day in on pain in rib area and not BUE- will continue to assess Left Upper Extremity Assessment LUE ROM/Strength/Tone: Deficits;Unable to fully assess Right Lower Extremity Assessment RLE ROM/Strength/Tone: [facility] for tasks assessed Left Lower Extremity Assessment LLE ROM/Strength/Tone: [facility] for tasks assessed   Mobility Bed Mobility Bed Mobility: Not assessed (pt/grandaughter in room and pt got up to the bathroom ) Details for Bed Mobility  Assistance: pt hs episodes of increase in coughing and increased pain with moblity Transfers Sit to Stand: 5: Supervision Stand to Sit: 5: Supervision  Balance Balance Balance Assessed: No  End of Session OT - End of Session Patient left: in chair   Nikko Goldwire, [place]ung 11/24/2011, 3:28 PM

## 2011-11-24 NOTE — Progress Notes (Signed)
Patient ID: Brandi Bates, female   DOB: 06-03-1956, 56 y.o.   MRN: [id number]  Drain at right mastectomy site draining little  Pulled drain  Will f/u in office next week.

## 2011-11-25 ENCOUNTER — Other Ambulatory Visit: Payer: Self-pay | Admitting: Pharmacist

## 2011-11-25 ENCOUNTER — Other Ambulatory Visit: Payer: Self-pay | Admitting: *Deleted

## 2011-11-25 DIAGNOSIS — D7289 Other specified disorders of white blood cells: Secondary | ICD-10-CM

## 2011-11-25 DIAGNOSIS — R0602 Shortness of breath: Secondary | ICD-10-CM

## 2011-11-25 DIAGNOSIS — I2699 Other pulmonary embolism without acute cor pulmonale: Secondary | ICD-10-CM | POA: Insufficient documentation

## 2011-11-25 DIAGNOSIS — J159 Unspecified bacterial pneumonia: Secondary | ICD-10-CM

## 2011-11-25 DIAGNOSIS — I10 Essential (primary) hypertension: Secondary | ICD-10-CM

## 2011-11-25 LAB — PROTIME-INR: INR: 2.07 — ABNORMAL HIGH (ref 0.00–1.49)

## 2011-11-25 LAB — BASIC METABOLIC PANEL
CO2: 28 mEq/L (ref 19–32)
Calcium: 8.1 mg/dL — ABNORMAL LOW (ref 8.4–10.5)
Creatinine, Ser: 0.6 mg/dL (ref 0.50–1.10)
GFR calc non Af Amer: >90 mL/min (ref >90)
Glucose, Bld: 83 mg/dL (ref 70–99)
Sodium: 142 mEq/L (ref 135–145)

## 2011-11-25 LAB — CBC
MCH: 28 pg (ref 26.0–34.0)
MCHC: 32 g/dL (ref 30.0–36.0)
MCV: 87.3 fL (ref 78.0–100.0)
Platelets: 210 10*3/uL (ref 150–400)
RBC: 3.22 MIL/uL — ABNORMAL LOW (ref 3.87–5.11)
RDW: 13.6 % (ref 11.5–15.5)

## 2011-11-25 MED ORDER — WARFARIN SODIUM 5 MG PO TABS
5.0000 mg | ORAL_TABLET | Freq: Once | ORAL | Status: AC
  Administered 2011-11-25: 5 mg via ORAL
  Filled 2011-11-25: qty 1

## 2011-11-25 MED ORDER — OXYCODONE HCL 20 MG PO TB12
20.0000 mg | ORAL_TABLET | Freq: Two times a day (BID) | ORAL | Status: DC
  Administered 2011-11-25 – 2011-11-26 (×2): 20 mg via ORAL
  Filled 2011-11-25 (×2): qty 1

## 2011-11-25 NOTE — Progress Notes (Signed)
PROGRESS NOTE  Brandi Bates ZOX:[id number] DOB: [birth year] DOA: 11/20/2011 PCP: Gaye Alken, MD, MD  Brief narrative: Ms. Day is a 66 growth female with a past medical history of left breast carcinoma status post left modified radical mastectomy in March of 2012, right breast cancer with mastectomy and sentinel lymph node biopsy done 11/10/2011 (negative nodes), who presented to the hospital on 11/20/2011 with a chief complaint of back pain and was ultimately diagnosed with acute pulmonary embolus.  Assessment/Plan: Principal Problem:  *Acute pulmonary embolism with back pain and hypoxia  Risk factors for pulmonary embolism include malignancy, treatment with tamoxifen and postoperative status.  With negative sentinel lymph nodes, may be a candidate for Coumadin therapy.  Consulted Dr. Welton Flakes of oncology by telephone to obtain her opinion regarding chronic Lovenox versus Lovenox with transition to Coumadin, and she recommends Lovenox with transition to Coumadin since the patient has no evidence of metastatic disease.  Cancelled two-dimensional echocardiogram as there was no evidence of right heart strain on 12-lead EKG and cardiac markers x2 are negative.  Continues to have significant back pain despite Toradol, Dilaudid-HP as needed, and Percocet.  Started OxyContin 11/22/11 for better pain control.  Lower extremity dopplers to rule out DVT in the left lower extremity vessels performed 11/22/11: Negative.  INR therapeutic. D/C Lovenox after tonight's dose.  Care management consulted for coumadin clinic referral.  Check oxygen saturation off oxygen and set up home oxygen if sats < 88% on room air. Active Problems:  Community acquired pneumonia / Leukocytosis  Pneumonia noted on CT of the chest.  White blood cell count minimally elevated on admission, now WNL.  Low-grade fever resolved.  On empiric Avelox therapy.  Normocytic anemia  On B12 supplementation.  History of  breast cancer  +BRCA mutation.  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 Maintained on tamoxifen post therapy.  Second breast tumor found and is now status post mastectomy and sentinel lymph node biopsy on 11/10/2011 with negative sentinel lymph nodes.  Follow up with Dr. Darnelle Catalan post discharge.  Hepatic steatosis / Overweight  BMI 29.1  Weight loss encouraged.  Constipation  MiraLAX daily, since on pain medications which can cause constipation.  Mag citrate given 11/23/11 with good results.  Code Status: Full Family Communication: Family updated at bedside. Disposition Plan: Home when stable.  Medical Consultants:  Telephone consultation made with Dr. Drue Second  Other consultants:  Pharmacy  Physical Therapy  Occupational Therapy  Antibiotics:  Avelox 11/20/11--->   Subjective  Brandi Bates continues to have significant back pain that is aggravated with movement.  The nurses report she continues to use IV dilaudid frequently for pain control.   Objective    Interim History: Stable overnight.   Objective: Filed Vitals:   11/24/11 1430 11/24/11 2302 11/25/11 0616 11/25/11 1419  BP:  116/67 142/104 114/72  Pulse:  76 78   Temp:  99.3 F (37.4 C) 98.5 F (36.9 C) 98 F (36.7 C)  TempSrc:  Other (Comment) Oral Oral  Resp:  18 20 18   Height:      Weight:   74.753 kg (164 lb 12.8 oz)   SpO2: 93% 93% 98% 96%    Intake/Output Summary (Last 24 hours) at 11/25/11 1630 Last data filed at 11/25/11 1300  Gross per 24  hour  Intake    480 ml  Output      0 ml  Net    480 ml    Exam: Gen:  NAD Cardiovascular:  RRR, No M/R/G Respiratory: Lungs diminished Gastrointestinal: Abdomen soft, NT/ND with normal active bowel  sounds. Extremities: Trace edema '   Data Reviewed: Basic Metabolic Panel:  Lab 11/25/11 1610 11/21/11 0820 11/20/11 2329 11/20/11 1540  NA 142 136 134* 137  K 3.8 3.8 -- --  CL 106 102 100 101  CO2 28 26 25 24   GLUCOSE 83 100* 126* 144*  BUN 11 9 8 8   CREATININE 0.60 0.67 0.62 0.57  CALCIUM 8.1* 8.8 8.7 9.0  MG -- -- 1.8 --  PHOS -- -- 3.1 --   GFR Estimated Creatinine Clearance: 77 ml/min (by C-G formula based on Cr of 0.6). Liver Function Tests:  Lab 11/21/11 0820 11/20/11 2329 11/20/11 1540  AST 14 15 18   ALT 19 21 23   ALKPHOS 57 56 59  BILITOT 0.5 0.6 0.6  PROT 6.3 6.3 6.7  ALBUMIN 2.8* 2.9* 3.2*   Coagulation profile  Lab 11/25/11 0425 11/24/11 0530 11/23/11 0500 11/22/11 0620 11/20/11 2329  INR 2.07* 2.15* 1.61* 1.25 1.16  PROTIME -- -- -- -- --    CBC:  Lab 11/25/11 0425 11/21/11 0820 11/20/11 2329 11/20/11 1540  WBC 4.0 10.0 11.4* 10.9*  NEUTROABS -- -- 9.6* --  HGB 9.0* 10.3* 10.5* 11.3*  HCT 28.1* 31.5* 32.6* 34.2*  MCV 87.3 87.3 86.9 86.4  PLT 210 158 161 157   Cardiac Enzymes:  Lab 11/21/11 1427 11/21/11 0820 11/20/11 2329  CKTOTAL 29 26 30   CKMB 1.4 1.3 1.4  CKMBINDEX -- -- --  TROPONINI <0.30 <0.30 <0.30    Procedures and Diagnostic Studies:  Ct Abdomen Pelvis W Wo Contrast 11/20/2011 IMPRESSION: No renal, ureteral, or bladder calculi.  No hydronephrosis.  Normal appendix.  No evidence of bowel obstruction.  Moderate stool in the right colon.  Patchy right lower lobe opacities, suspicious for pneumonia. Associated small right pleural effusion.  Severe hepatic steatosis.  Low-lying bladder/cystocele.  Status post right mastectomy with indwelling surgical drain.  Original Report Authenticated By: Charline Bills, M.D.    Ct Angio Chest W/cm &/or Wo Cm 11/20/2011  IMPRESSION: The study is positive for acute pulmonary thromboembolism within segmental branches of the right lower lobe.  Right pleural effusion.  Right lower lobe airspace  opacities.  Postoperative changes.  Critical Value/emergent results were called by telephone at the time of interpretation on 11/20/2011  at 1811 hours  to  Dr. Lynelle Doctor, who verbally acknowledged these results.  Original Report Authenticated By: Donavan Burnet, M.D.    Scheduled Meds:    . enoxaparin (LOVENOX) injection  75 mg Subcutaneous Q12H  . guaiFENesin  600 mg Oral BID  . moxifloxacin  400 mg Oral q1800  . oxyCODONE  10 mg Oral Q12H  . polyethylene glycol  17 g Oral Daily  . sodium chloride  3 mL Intravenous Q12H  . vitamin B-12  100 mcg Oral Daily  . warfarin  2.5 mg Oral ONCE-1800  . warfarin  5 mg Oral ONCE-1800  . Warfarin - Pharmacist Dosing Inpatient   Does not apply q1800   Continuous Infusions:     LOS: 5 days   [name], MD Pager [phone]  11/25/2011, 4:30 PM   Patient Teaching (information given to and reviewed with the patient): Brandi Bates 11/25/2011  Treatment team:  Dr. Trula Ore Carita Sollars, Hospitalist (Internist)   Dr. Ruthann Cancer, Oncologist  Medical Issues and plan: Principal Problem:  *Blood clot in the lungs  Risk factors for blood clots include malignancy, treatment with tamoxifen and postoperative status.  With negative sentinel lymph nodes, may be a candidate for Coumadin therapy.  Spoke with Dr. Welton Flakes of oncology by telephone to obtain her opinion regarding the best way to treat this blood clot. She recommends treatment with Lovenox and Coumadin until the Coumadin levels are therapeutic. It typically takes about 5 days on Lovenox before Coumadin levels become therapeutic enough to stop the Lovenox.  Blood test used to evaluate coumadin is called the "INR" and the goal is for this to be between 2-3.  Your INR today is 2.07.  We will stop the Lovenox after tonight's dose.  Given education and coumadin instruction by pharmacy staff.  OxyContin started for pain management: We will increase this today in anticipation of your discharge  home tomorrow.  We need to try to control your pain with oral medicines.  No evidence of heart strain on your 12-lead EKG. Your heart enzyme tests were normal which indicates there has not been any heart damage, so no need to do Echo at this time.  Dopplers of your legs done on or/28/2013 and these were negative for any blood clots in the legs. Active Problems: Possible pneumonia with elevated white blood cells   Pneumonia noted on CT of the chest.  White blood cell count were minimally elevated (these cells grow up in cases of infection or inflammation) on admission and are normal today.  Low-grade fever.  On antibiotics.  Low blood count (anemia)  This can be associated with chronic diseases or nutritional deficiencies.  On B12 supplementation.  History of breast cancer  +BRCA mutation.  Status post left modified radical mastectomy March of 2012, status post chemotherapy and radiation therapy. Maintained on tamoxifen post therapy.  Second breast tumor found and is now status post mastectomy and sentinel lymph node biopsy on 11/10/2011 with negative sentinel lymph nodes.  Fatty liver / Overweight  Fatty liver found on your CT scans incidentally. This can be associated with weight gain.  BMI 29.1  Recommend weight loss.  Constipation  MiraLAX daily, since on pain medications which can cause constipation.  Mag citrate given 11/24/11.   Anticipated discharge date: 11/26/11

## 2011-11-25 NOTE — Progress Notes (Signed)
Talked to Norval Gable at the [facility] [phone]; they will be able to arrange an appointment for her to get her PT/INR checked when we know an exact discharge date; B Lobbyist, BSN, [place].

## 2011-11-25 NOTE — Progress Notes (Signed)
[facility] for an apt for follow up PT/INR after discharge; awaiting call back; B Lobbyist, BSN, [place]

## 2011-11-25 NOTE — Progress Notes (Signed)
Patient still having issues with pain and taking IV dilaudid approximately every 3 hours. Brandi Bates

## 2011-11-25 NOTE — Progress Notes (Signed)
ANTICOAGULATION CONSULT NOTE - Follow Up  Pharmacy Consult for Coumadin Indication: PE  Allergies  Allergen Reactions  . Oxycodone-Acetaminophen Nausea And Vomiting    Tolerated Percocet during admission 4/13  . Morphine And Related Hives and Itching    All over the body  . Omeprazole Magnesium Nausea Only  . Gabapentin Other (See Comments)    Unable to sleep  . Latex Rash    Only where touched    Patient Measurements: Height: 5' 2.99" (160 cm) (as reported on 11/06/11) Weight: 164 lb 12.8 oz (74.753 kg) IBW/kg (Calculated) : 52.38   Vital Signs: Temp: 98.5 F (36.9 C) (05/01 0616) Temp src: Oral (05/01 0616) BP: 142/104 mmHg (05/01 0616) Pulse Rate: 78  (05/01 0616)  Labs:  Basename 11/25/11 0425 11/24/11 0530 11/23/11 0500  HGB 9.0* -- --  HCT 28.1* -- --  PLT 210 -- --  APTT -- -- --  LABPROT 23.7* 24.4* 19.4*  INR 2.07* 2.15* 1.61*  HEPARINUNFRC -- -- --  CREATININE 0.60 -- --  CKTOTAL -- -- --  CKMB -- -- --  TROPONINI -- -- --   Estimated Creatinine Clearance: 77 ml/min (by C-G formula based on Cr of 0.6).  Medical History: Past Medical History  Diagnosis Date  . Numbness of feet   . GERD (gastroesophageal reflux disease)   . Chronic low back pain     "everyday"  . Eczema   . Lymphedema of arm     left  . History of radiation therapy 04/23/11 thru 06/08/11    L breast  . Breast wound   . PONV (postoperative nausea and vomiting)   . Migraines   . Neuromuscular disorder     raynauds syndrome   . Shortness of breath on exertion   . Breast cancer 10/23/10    s/p L mastectomy, chemo/radiation, er/pr +, Her2 -  . Breast cancer 11/10/11    S/P right mastectomy  . Anxiety     Medications:  Scheduled:     . enoxaparin (LOVENOX) injection  75 mg Subcutaneous Q12H  . guaiFENesin  600 mg Oral BID  . moxifloxacin  400 mg Oral q1800  . oxyCODONE  10 mg Oral Q12H  . polyethylene glycol  17 g Oral Daily  . sodium chloride  3 mL Intravenous Q12H  .  vitamin B-12  100 mcg Oral Daily  . warfarin  2.5 mg Oral ONCE-1800  . Warfarin - Pharmacist Dosing Inpatient   Does not apply q1800   Infusions:   PRN: albuterol, chlorpheniramine-HYDROcodone, clonazePAM, cyclobenzaprine, HYDROmorphone, ketorolac, oxyCODONE-acetaminophen, sodium chloride  Assessment:  55 YOF w/ PMH breast ca, started on Lovenox 1mg /kg q12h (75mg  sq q12h) for PE. Per onc recs, continue with transition to warfarin since the patient has no evidence of metastatic disease.  Pt is on Avelox which may increase sensitivity to warfarin.   No bleeding reported  Day 5 of 5 day minimum overlap w/ LMWH. INR remains >2, therefore can d/c Lovenox today.  Goal of Therapy:  INR 2-3   Plan:   Warfarin 5mg  PO x1 at 18:00  D/C Lovenox after tonight's dose.  Darrol Angel, PharmD Pager: [phone] 11/25/2011 1:42 PM

## 2011-11-25 NOTE — Progress Notes (Signed)
Pt will be followed by [facility] Coumad[facility] clinic upon discharge for dx of PE on 11/21/11. Dr. Darnelle Catalan would like pt to be on anticoagulation for 6 months. Risk factor includes h/o malignancy (breast) Jiles Crocker, case manager at [facility] (ph [phone]) will call pharmacy at [facility] to notify us of discharge date so we can set her up for her 1st visit at our Coumadin clinic. Note: teaching for anticoag has already taken place by [facility] IP pharmacists. Marily Lente, Pharm.D.

## 2011-11-25 NOTE — Progress Notes (Signed)
PT Cancellation Note  Treatment cancelled today due to pt in pain and now resting as she said she did not sleep well last night. Pt states she has been up in the room this morning on her own. Ebony Hail [facility] 11/25/2011, 11:26 AM

## 2011-11-26 ENCOUNTER — Ambulatory Visit: Payer: Self-pay | Admitting: Pharmacist

## 2011-11-26 ENCOUNTER — Other Ambulatory Visit: Payer: Self-pay | Admitting: Oncology

## 2011-11-26 ENCOUNTER — Telehealth: Payer: Self-pay | Admitting: *Deleted

## 2011-11-26 DIAGNOSIS — D7289 Other specified disorders of white blood cells: Secondary | ICD-10-CM

## 2011-11-26 DIAGNOSIS — R0602 Shortness of breath: Secondary | ICD-10-CM

## 2011-11-26 DIAGNOSIS — J159 Unspecified bacterial pneumonia: Secondary | ICD-10-CM

## 2011-11-26 DIAGNOSIS — I2699 Other pulmonary embolism without acute cor pulmonale: Secondary | ICD-10-CM

## 2011-11-26 DIAGNOSIS — I10 Essential (primary) hypertension: Secondary | ICD-10-CM

## 2011-11-26 LAB — PROTIME-INR: INR: 2 — ABNORMAL HIGH (ref 0.00–1.49)

## 2011-11-26 MED ORDER — MOXIFLOXACIN HCL 400 MG PO TABS: 400.0000 mg | ORAL_TABLET | Freq: Every day | ORAL | Status: DC

## 2011-11-26 MED ORDER — HEPARIN SOD (PORK) LOCK FLUSH 100 UNIT/ML IV SOLN
500.0000 [IU] | INTRAVENOUS | Status: DC | PRN
  Administered 2011-11-26: 500 [IU]
  Filled 2011-11-26: qty 5

## 2011-11-26 MED ORDER — HEPARIN SOD (PORK) LOCK FLUSH 100 UNIT/ML IV SOLN
500.0000 [IU] | INTRAVENOUS | Status: DC
  Filled 2011-11-26: qty 5

## 2011-11-26 MED ORDER — HYDROMORPHONE HCL 2 MG PO TABS: 2.0000 mg | ORAL_TABLET | ORAL | Status: AC | PRN

## 2011-11-26 MED ORDER — WARFARIN SODIUM 5 MG PO TABS: 5.0000 mg | ORAL_TABLET | Freq: Every day | ORAL | Status: DC

## 2011-11-26 MED ORDER — OXYCODONE HCL 20 MG PO TB12: 20.0000 mg | ORAL_TABLET | Freq: Two times a day (BID) | ORAL | Status: DC

## 2011-11-26 NOTE — Progress Notes (Signed)
Pt states she is going home today.  Pts feels she is able with help to perform ADL activity.  Pt and husband inquired where to obtain a tub grab bar.   OT provided several options to obtain a safe tub grab bar (medical supply store).  Lise Auer, OT

## 2011-11-26 NOTE — Progress Notes (Signed)
Pt ambulated to the bathroom and back to bed yesterday. On room air, O2 saturation dropped from 96%  to 90% immediately after ambulating. Oxygen level rose within a few minutes back to 96%. Pt was left off of oxygen.

## 2011-11-26 NOTE — Discharge Summary (Signed)
Physician Discharge Summary  Patient ID: Brandi Bates MRN: [id number] DOB/AGE: [birth year] 56 y.o.  Admit date: 11/20/2011 Discharge date: 11/26/2011  Primary Care Physician:  Gaye Alken, MD, MD Oncologist: Dr. Raymond Gurney Magrinat  Discharge Diagnoses:    Present on Admission:  .Acute pulmonary embolism .Community acquired pneumonia .Leukocytosis .Normocytic anemia .Hepatic steatosis .Constipation  Discharge Medications:  Medication List  As of 11/26/2011  9:38 AM   TAKE these medications         clonazePAM 0.5 MG tablet   Commonly known as: KLONOPIN   Take 0.5 mg by mouth at bedtime as needed. For anxiety      cyclobenzaprine 10 MG tablet   Commonly known as: FLEXERIL   Take 1 tablet (10 mg total) by mouth 3 (three) times daily as needed for muscle spasms.      HYDROmorphone 2 MG tablet   Commonly known as: DILAUDID   Take 1-2 tablets (2-4 mg total) by mouth every 4 (four) hours as needed for pain.      ibuprofen 200 MG tablet   Commonly known as: ADVIL,MOTRIN   Take 600 mg by mouth every 8 (eight) hours as needed. For pain      LYSINE PO   Take 1 tablet by mouth daily.      moxifloxacin 400 MG tablet   Commonly known as: AVELOX   Take 1 tablet (400 mg total) by mouth daily at 6 PM.      oxyCODONE 20 MG 12 hr tablet   Commonly known as: OXYCONTIN   Take 1 tablet (20 mg total) by mouth every 12 (twelve) hours.      oxyCODONE-acetaminophen 5-325 MG per tablet   Commonly known as: PERCOCET      polyethylene glycol packet   Commonly known as: MIRALAX / GLYCOLAX   Take 17 g by mouth daily as needed. For constipation      VITAMIN B-12 PO   Take 1 tablet by mouth daily.      warfarin 5 MG tablet   Commonly known as: COUMADIN   Take 1 tablet (5 mg total) by mouth daily.             Disposition and Follow-up: The patient is being discharged home. [facility] will [facility]. She is instructed to followup with Dr.  Darnelle Catalan next week and with her primary care physician as needed.   Medical Consults:  Telephone consultation made with Dr. Welton Flakes on date of admission.  Dr. Abigail Miyamoto: Removed JP drain.  Other Consults:  Pharmacy   Physical Therapy: Home physical therapy and rolling walker recommended, patient declined services and equipment.  Occupational Therapy   Procedures and Diagnostic Studies:   Ct Abdomen Pelvis W Wo Contrast 11/20/2011   IMPRESSION: No renal, ureteral, or bladder calculi.  No hydronephrosis.  Normal appendix.  No evidence of bowel obstruction.  Moderate stool in the right colon.  Patchy right lower lobe opacities, suspicious for pneumonia. Associated small right pleural effusion.  Severe hepatic steatosis.  Low-lying bladder/cystocele.  Status post right mastectomy with indwelling surgical drain.  Original Report Authenticated By: Charline Bills, M.D.    Ct Angio Chest W/cm &/or Wo Cm 11/20/2011 IMPRESSION: The study is positive for acute pulmonary thromboembolism within segmental branches of the right lower lobe.  Right pleural effusion.  Right lower lobe airspace opacities.  Postoperative changes.  Critical Value/emergent results were called by telephone at the time of interpretation on 11/20/2011  at  1811 hours  to  Dr. Lynelle Doctor, who verbally acknowledged these results.  Original Report Authenticated By: Donavan Burnet, M.D.    Discharge Laboratory Values: Basic Metabolic Panel:  Lab 11/25/11 7829 11/21/11 0820 11/20/11 2329 11/20/11 1540  NA 142 136 134* 137  K 3.8 3.8 -- --  CL 106 102 100 101  CO2 28 26 25 24   GLUCOSE 83 100* 126* 144*  BUN 11 9 8 8   CREATININE 0.60 0.67 0.62 0.57  CALCIUM 8.1* 8.8 8.7 9.0  MG -- -- 1.8 --  PHOS -- -- 3.1 --   GFR Estimated Creatinine Clearance: 74.8 ml/min (by C-G formula based on Cr of 0.6). Liver Function Tests:  Lab 11/21/11 0820 11/20/11 2329 11/20/11 1540  AST 14 15 18   ALT 19 21 23   ALKPHOS 57 56 59  BILITOT  0.5 0.6 0.6  PROT 6.3 6.3 6.7  ALBUMIN 2.8* 2.9* 3.2*   Coagulation profile  Lab 11/26/11 0455 11/25/11 0425 11/24/11 0530 11/23/11 0500 11/22/11 0620  INR 2.00* 2.07* 2.15* 1.61* 1.25  PROTIME -- -- -- -- --    CBC:  Lab 11/25/11 0425 11/21/11 0820 11/20/11 2329 11/20/11 1540  WBC 4.0 10.0 11.4* 10.9*  NEUTROABS -- -- 9.6* --  HGB 9.0* 10.3* 10.5* 11.3*  HCT 28.1* 31.5* 32.6* 34.2*  MCV 87.3 87.3 86.9 86.4  PLT 210 158 161 157   Cardiac Enzymes:  Lab 11/21/11 1427 11/21/11 0820 11/20/11 2329  CKTOTAL 29 26 30   CKMB 1.4 1.3 1.4  CKMBINDEX -- -- --  TROPONINI <0.30 <0.30 <0.30   CBG:  Lab 11/26/11 0750 11/25/11 0734 11/24/11 1658 11/24/11 0623 11/23/11 0800  GLUCAP 85 83 94 76 82     Brief H and P: For complete details please refer to admission H and P, but in brief, Brandi Bates is a 56 growth female with a past medical history of left breast carcinoma status post left modified radical mastectomy in March of 2012, right breast cancer with mastectomy and sentinel lymph node biopsy done 11/10/2011 (negative nodes), who presented to the hospital on 11/20/2011 with a chief complaint of back pain and was ultimately diagnosed with acute pulmonary embolus.   Physical Exam at Discharge: BP 153/75  Pulse 68  Temp(Src) 98.3 F (36.8 C) (Oral)  Resp 18  Ht 5' 2.99" (1.6 m)  Wt 70.353 kg (155 lb 1.6 oz)  BMI 27.48 kg/m2  SpO2 97% Gen: NAD  Cardiovascular: RRR, No M/R/G  Respiratory: Lungs diminished  Gastrointestinal: Abdomen soft, NT/ND with normal active bowel sounds.  Extremities: Trace edema    Hospital Course:  Principal Problem:  *Acute pulmonary embolism with back pain and hypoxia  Risk factors for pulmonary embolism included malignancy, treatment with tamoxifen and postoperative status.  With negative sentinel lymph nodes, may be a candidate for Coumadin therapy.  Consulted Dr. Welton Flakes of oncology by telephone to obtain her opinion regarding chronic Lovenox versus  Lovenox with transition to Coumadin, and she recommended Lovenox with transition to Coumadin since the patient has not had evidence of metastatic disease.  Two-dimensional echocardiogram ordered on admission. Cancelled two-dimensional echocardiogram as there was no evidence of right heart strain on 12-lead EKG and cardiac markers x2 are negative.  Continued to have significant back pain despite Toradol, Dilaudid-HP as needed, and Percocet. Started OxyContin 11/22/11 for better pain control.  Lower extremity dopplers to rule out DVT in the left lower extremity vessels performed 11/22/11: Negative.  INR therapeutic x48 hours. Patient was maintained on  Lovenox for 48 hours of overlap therapy with Coumadin.  Care management consulted for coumadin clinic referral. Followup has been arranged. Oxygen saturation off oxygen greater than 90% with ambulation, so the patient did not qualify for home oxygen. Active Problems:  Community acquired pneumonia / Leukocytosis  Pneumonia noted on CT of the chest.  White blood cell count minimally elevated on admission, now WNL.  Low-grade fever resolved.  On empiric Avelox therapy. We'll discharge on additional 4 days of therapy. Normocytic anemia  On B12 supplementation. History of breast cancer  +BRCA mutation. 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 Maintained on tamoxifen post therapy.  Second breast tumor found and is now status post mastectomy and sentinel lymph node biopsy on 11/10/2011 with negative sentinel lymph nodes.  Follow up with Dr. Darnelle Catalan post discharge. Hepatic steatosis / Overweight  BMI 29.1  Weight loss encouraged. Constipation  MiraLAX daily, since on pain medications which can cause constipation.  Mag  citrate given 11/23/11 with good results.   Diet:  Regular.  Activity:  Increase activity slowly.  Condition at Discharge:   Stable.  Time spent on Discharge:  35 minutes.  Signed: Dr. Trula Ore Leroi Haque Pager [phone] 11/26/2011, 9:38 AM

## 2011-11-26 NOTE — Discharge Instructions (Signed)
Pulmonary Embolus A pulmonary (lung) embolus (PE) is a blood clot that has traveled from another place in the body to the lung. Most clots come from deep veins in the legs or pelvis. PE is a dangerous and potentially life-threatening condition that can be treated if identified. CAUSES Blood clots form in a vein for different reasons. Usually several things cause blood clots. They include:  The flow of blood slows down.   The inside of the vein is damaged in some way.   The person has a condition that makes the blood clot more easily. These conditions may include:   Older age (especially over 75 years old).   Having a history of blood clots.   Having major or lengthy surgery. Hip surgery is particularly high-risk.   Breaking a hip or leg.   Sitting or lying still for a long time.   Cancer or cancer treatment.   Having a long, thin tube (catheter) placed inside a vein during a medical procedure.   Being overweight (obese).   Pregnancy and childbirth.   Medicines with estrogen.   Smoking.   Other circulation or heart problems.  SYMPTOMS  The symptoms of a PE usually start suddenly and include:  Shortness of breath.   Coughing.   Coughing up blood or blood-tinged mucus (phlegm).   Chest pain. Pain is often worse with deep breaths.   Rapid heartbeat.  DIAGNOSIS  If a PE is suspected, your caregiver will take a medical history and carry out a physical exam. Your caregiver will check for the risk factors listed above. Tests that also may be required include:  Blood tests, including studies of the clotting properties of your blood.   Imaging tests. Ultrasound, CT, MRI, and other tests can all be used to see if you have clots in your legs or lungs. If you have a clot in your legs and have breathing or chest problems, your caregiver may conclude that you have a clot in your lungs. Further lung tests may not be needed.   An EKG can look for heart strain from blood clots in  the lungs.  PREVENTION   Exercise the legs regularly. Take a brisk 30 minute walk every day.   Maintain a weight that is appropriate for your height.   Avoid sitting or lying in bed for long periods of time without moving your legs.   Women, particularly those over the age of 35, should consider the risks and benefits of taking estrogen medicines, including birth control pills.   Do not smoke, especially if you take estrogen medicines.   Long-distance travel can increase your risk. You should exercise your legs by walking or pumping the muscles every hour.   In hospital prevention:   Your caregiver will assess your need for preventive PE care (prophylaxis) when you are admitted to the hospital. If you are having surgery, your surgeon will assess you the day of or day after surgery.   Prevention may include medical and nonmedical measures.  TREATMENT   The most common treatment for a PE is blood thinning (anticoagulant) medicine, which reduces the blood's tendency to clot. Anticoagulants can stop new blood clots from forming and old ones from growing. They cannot dissolve existing clots. Your body does this by itself over time. Anticoagulants can be given by mouth, by intravenous (IV) access, or by injection. Your caregiver will determine the best program for you.   Less commonly, clot-dissolving drugs (thrombolytics) are used to dissolve a PE. They   carry a high risk of bleeding, so they are used mainly in severe cases.   Very rarely, a blood clot in the leg needs to be removed surgically.   If you are unable to take anticoagulants, your caregiver may arrange for you to have a filter placed in a main vein in your belly (abdomen). This filter prevents clots from traveling to your lungs.  HOME CARE INSTRUCTIONS   Take all medicines prescribed by your caregiver. Follow the directions carefully.   You will most likely continue taking anticoagulants after you leave the hospital. Your  caregiver will advise you on the length of treatment (usually 3 to 6 months, sometimes for life).   Taking too much or too little of an anticoagulant is dangerous. While taking this type of medicine, you will need to have regular blood tests to be sure the dose is correct. The dose can change for many reasons. It is critically important that you take this medicine exactly as prescribed and that you have blood tests exactly as directed.   Many foods can interfere with anticoagulants. These include foods high in vitamin K, such as spinach, kale, broccoli, cabbage, collard and turnip greens, Brussels sprouts, peas, cauliflower, seaweed, parsley, beef and pork liver, green tea, and soybean oil. Your caregiver should discuss limits on these foods with you or you should arrange a visit with a dietician to answer your questions.   Many medicines can interfere with anticoagulants. You must tell your caregiver about any and all medicines you take. This includesall vitamins and supplements. Be especially cautious with aspirin and anti-inflammatory medicines. Ask your caregiver before taking these.   Anticoagulants can have side effects, mostly excessive bruising or bleeding. You will need to hold pressure over cuts for longer than usual. Avoid alcoholic drinks or consume only very small amounts while taking this medicine.   If you are taking an anticoagulant:   Wear a medical alert bracelet.   Notify your dentist or other caregivers before procedures.   Avoid contact sports.   Ask your caregiver how soon you can go back to normal activities. Not being active can lead to new clots. Ask for a list of what you should and should not do.   Exercise your lower leg muscles. This is important while traveling.   You may need to wear compression stockings. These are tight elastic stockings that apply pressure to the lower legs. This can help keep the blood in the legs from clotting.   If you are a smoker, you  should quit.   Learn as much as you can about pulmonary embolisms.  SEEK MEDICAL CARE IF:   You notice a rapid heartbeat.   You feel weaker or more tired than usual.   You feel faint.   You notice increased bruising.   Your symptoms are not getting better in the time expected.   You are having side effects of medicine.   You have an oral temperature above 102 F (38.9 C).   You discover other family members with blood clots. This may require further testing for inherited diseases or conditions.  SEEK IMMEDIATE MEDICAL CARE IF:   You have chest pain.   You have trouble breathing.   You have new or increased swelling or pain in one leg.   You cough up blood.   You notice blood in vomit, in a bowel movement, or in urine.   You have an oral temperature above 102 F (38.9 C), not controlled by medicine.    You may have another PE. A blood clot in the lungs is a medical emergency. Call your local emergency services (911 in [place] to get to the nearest hospital or clinic. Do not drive yourself. MAKE SURE YOU:   Understand these instructions.   Will watch your condition.   Will get help right away if you are not doing well or get worse.  Document Released: 07/10/2000 Document Revised: 07/02/2011 Document Reviewed: 01/14/2009 [employer] Patient Information 2012 [employer]. 

## 2011-11-26 NOTE — Progress Notes (Signed)
Pt started on Coumadin 11/21/11 in [facility] and was dosed as followed: 4/27  7.5mg    4/28  7.5mg   INR=1.25 4/29  5mg      INR=1.61 4/30  2.5mg   INR=2.15 5/1   5mg       INR=2.07 5/2   Pt D/C on 5mg  daily  INR=2        Lovenox d/c  Will see pt in Coumadin clinic on 11/30/11

## 2011-11-26 NOTE — Telephone Encounter (Signed)
gave patient appointment for the next four mondays getting lab only appointments 11-30-2011 12-03-2011 12-08-2011 12-14-2011

## 2011-11-26 NOTE — Progress Notes (Signed)
The coumadin clinic called talked to [facility] [phone]); Apt is May 12/2011 at 10 am;

## 2011-11-27 ENCOUNTER — Ambulatory Visit (INDEPENDENT_AMBULATORY_CARE_PROVIDER_SITE_OTHER): Payer: BC Managed Care – PPO | Admitting: Surgery

## 2011-11-27 ENCOUNTER — Encounter (INDEPENDENT_AMBULATORY_CARE_PROVIDER_SITE_OTHER): Payer: Self-pay | Admitting: Surgery

## 2011-11-27 VITALS — HR 82 | Temp 97.7°F | Resp 16 | Ht 64.0 in | Wt 164.6 lb

## 2011-11-27 DIAGNOSIS — Z09 Encounter for follow-up examination after completed treatment for conditions other than malignant neoplasm: Secondary | ICD-10-CM

## 2011-11-27 NOTE — Progress Notes (Signed)
Subjective:     Patient ID: Brandi Bates, female   DOB: July 30, 1955, 56 y.o.   MRN: [id number]  HPI She is here for her first postop visit status post right breast mastectomy. Her course was complicated by developing a pulmonary embolism for which she is now on Coumadin. She still has a significant amount of back pain and pain in the chest.  Review of Systems     Objective:   Physical Exam On exam, her lungs are clear. Her mastectomy site is well healed.    Assessment:     Patient status post right mastectomy for a new breast cancer. Postop pulmonary embolism    Plan:     She will continue the Coumadin. I'm going to add a fentanyl patch her pain regimen. I will see her back in 2 weeks.

## 2011-11-30 ENCOUNTER — Other Ambulatory Visit: Payer: BC Managed Care – PPO

## 2011-11-30 ENCOUNTER — Other Ambulatory Visit (HOSPITAL_BASED_OUTPATIENT_CLINIC_OR_DEPARTMENT_OTHER): Payer: BC Managed Care – PPO | Admitting: Lab

## 2011-11-30 ENCOUNTER — Telehealth (INDEPENDENT_AMBULATORY_CARE_PROVIDER_SITE_OTHER): Payer: Self-pay | Admitting: General Surgery

## 2011-11-30 ENCOUNTER — Ambulatory Visit: Payer: BC Managed Care – PPO

## 2011-11-30 ENCOUNTER — Ambulatory Visit (HOSPITAL_BASED_OUTPATIENT_CLINIC_OR_DEPARTMENT_OTHER): Payer: BC Managed Care – PPO | Admitting: Pharmacist

## 2011-11-30 ENCOUNTER — Other Ambulatory Visit: Payer: BC Managed Care – PPO | Admitting: Lab

## 2011-11-30 DIAGNOSIS — I2699 Other pulmonary embolism without acute cor pulmonale: Secondary | ICD-10-CM

## 2011-11-30 DIAGNOSIS — C50919 Malignant neoplasm of unspecified site of unspecified female breast: Secondary | ICD-10-CM

## 2011-11-30 LAB — POCT INR
INR: 3.9
INR: 3.9

## 2011-11-30 LAB — CBC WITH DIFFERENTIAL/PLATELET
Basophils Absolute: 0 10*3/uL (ref 0.0–0.1)
EOS%: 5.1 % (ref 0.0–7.0)
Eosinophils Absolute: 0.3 10*3/uL (ref 0.0–0.5)
HCT: 34 % — ABNORMAL LOW (ref 34.8–46.6)
HGB: 10.9 g/dL — ABNORMAL LOW (ref 11.6–15.9)
LYMPH%: 17.1 % (ref 14.0–49.7)
MCH: 27.3 pg (ref 25.1–34.0)
MCV: 85.2 fL (ref 79.5–101.0)
MONO%: 4.6 % (ref 0.0–14.0)
NEUT#: 4.1 10*3/uL (ref 1.5–6.5)
NEUT%: 73.2 % (ref 38.4–76.8)
Platelets: 325 10*3/uL (ref 145–400)
RDW: 14.1 % (ref 11.2–14.5)

## 2011-11-30 LAB — PROTIME-INR: Protime: 46.8 Seconds — ABNORMAL HIGH (ref 10.6–13.4)

## 2011-11-30 NOTE — Progress Notes (Signed)
INR above goal today. This is likely secondary to a drug interaction with Avelox. Pt took her last dose (of 7 day course) last night. Recheck INR in 1 week with already scheduled appointment with Dr. Darnelle Catalan. Coumadin dose may need to be increased slightly at next visit since Avelox was discontinued.

## 2011-11-30 NOTE — Telephone Encounter (Signed)
Patient was told to call today to let Dr Magnus Ivan know how she is doing. States she is feeling much better and pain in chest/back has improved over the weekend. She will call if needed.

## 2011-11-30 NOTE — Patient Instructions (Addendum)
INR elevated today. This is likely secondary to a drug interaction with Avelox. Pt took her last dose (of 7 day course) last night. Recheck INR in 1 week with already scheduled appointment with Dr. Darnelle Catalan. You don't need to come to the lab appointment scheduled on 12/03/11. Those labs were drawn today. Val will cancel that appointment.

## 2011-12-01 ENCOUNTER — Encounter (HOSPITAL_COMMUNITY): Payer: Self-pay

## 2011-12-01 ENCOUNTER — Emergency Department (HOSPITAL_COMMUNITY)
Admission: EM | Admit: 2011-12-01 | Discharge: 2011-12-01 | Disposition: A | Discharge status: Home / Self Care | Payer: BC Managed Care – PPO | Attending: Emergency Medicine | Admitting: Emergency Medicine

## 2011-12-01 ENCOUNTER — Other Ambulatory Visit: Payer: Self-pay | Admitting: Oncology

## 2011-12-01 ENCOUNTER — Encounter: Payer: Self-pay | Admitting: *Deleted

## 2011-12-01 DIAGNOSIS — K219 Gastro-esophageal reflux disease without esophagitis: Secondary | ICD-10-CM | POA: Insufficient documentation

## 2011-12-01 DIAGNOSIS — R0602 Shortness of breath: Secondary | ICD-10-CM | POA: Insufficient documentation

## 2011-12-01 DIAGNOSIS — Z853 Personal history of malignant neoplasm of breast: Secondary | ICD-10-CM | POA: Insufficient documentation

## 2011-12-01 DIAGNOSIS — R1011 Right upper quadrant pain: Secondary | ICD-10-CM | POA: Insufficient documentation

## 2011-12-01 DIAGNOSIS — I2699 Other pulmonary embolism without acute cor pulmonale: Secondary | ICD-10-CM | POA: Insufficient documentation

## 2011-12-01 DIAGNOSIS — Z79899 Other long term (current) drug therapy: Secondary | ICD-10-CM | POA: Insufficient documentation

## 2011-12-01 DIAGNOSIS — M549 Dorsalgia, unspecified: Secondary | ICD-10-CM

## 2011-12-01 LAB — BASIC METABOLIC PANEL
Calcium: 9 mg/dL (ref 8.4–10.5)
Creatinine, Ser: 0.59 mg/dL (ref 0.50–1.10)
GFR calc non Af Amer: >90 mL/min (ref >90)
Glucose, Bld: 127 mg/dL — ABNORMAL HIGH (ref 70–99)
Sodium: 141 mEq/L (ref 135–145)

## 2011-12-01 LAB — DIFFERENTIAL
Basophils Absolute: 0 10*3/uL (ref 0.0–0.1)
Basophils Relative: 0 % (ref 0–1)
Eosinophils Absolute: 0.2 10*3/uL (ref 0.0–0.7)
Monocytes Absolute: 0.3 10*3/uL (ref 0.1–1.0)
Neutro Abs: 4 10*3/uL (ref 1.7–7.7)
Neutrophils Relative %: 73 % (ref 43–77)

## 2011-12-01 LAB — CBC
HCT: 33 % — ABNORMAL LOW (ref 36.0–46.0)
MCH: 27.6 pg (ref 26.0–34.0)
MCHC: 32.4 g/dL (ref 30.0–36.0)
RDW: 13.9 % (ref 11.5–15.5)

## 2011-12-01 MED ORDER — HEPARIN SOD (PORK) LOCK FLUSH 100 UNIT/ML IV SOLN
500.0000 [IU] | INTRAVENOUS | Status: AC | PRN
  Administered 2011-12-01: 500 [IU]

## 2011-12-01 MED ORDER — HYDROMORPHONE HCL PF 1 MG/ML IJ SOLN
1.0000 mg | Freq: Once | INTRAMUSCULAR | Status: AC
  Administered 2011-12-01: 1 mg via INTRAVENOUS
  Filled 2011-12-01: qty 1

## 2011-12-01 MED ORDER — SODIUM CHLORIDE 0.9 % IV BOLUS (SEPSIS)
1000.0000 mL | Freq: Once | INTRAVENOUS | Status: AC
  Administered 2011-12-01: 1000 mL via INTRAVENOUS

## 2011-12-01 MED ORDER — SODIUM CHLORIDE 0.9 % IJ SOLN
10.0000 mL | INTRAMUSCULAR | Status: DC | PRN
  Administered 2011-12-01: 10 mL

## 2011-12-01 NOTE — Discharge Instructions (Signed)
You were seen and evaluated today for your back and chest pain. Your lab tests today showed that your INR level for your Coumadin was in a good range to prevent blood clots. There was also no other concerning findings on your lab tests. At this time your providers feel you may return home and followup with your primary care provider. Return to the emergency room for any worsening symptoms, increase shortness of breath, fever, chills, persistent nausea vomiting.   Back Pain, Adult Low back pain is very common. About 1 in 5 people have back pain.The cause of low back pain is rarely dangerous. The pain often gets better over time.About half of people with a sudden onset of back pain feel better in just 2 weeks. About 8 in 10 people feel better by 6 weeks.  CAUSES Some common causes of back pain include:  Strain of the muscles or ligaments supporting the spine.   Wear and tear (degeneration) of the spinal discs.   Arthritis.   Direct injury to the back.  DIAGNOSIS Most of the time, the direct cause of low back pain is not known.However, back pain can be treated effectively even when the exact cause of the pain is unknown.Answering your caregiver's questions about your overall health and symptoms is one of the most accurate ways to make sure the cause of your pain is not dangerous. If your caregiver needs more information, he or she may order lab work or imaging tests (X-rays or MRIs).However, even if imaging tests show changes in your back, this usually does not require surgery. HOME CARE INSTRUCTIONS For many people, back pain returns.Since low back pain is rarely dangerous, it is often a condition that people can learn to [facility] their own.   Remain active. It is stressful on the back to sit or stand in one place. Do not sit, drive, or stand in one place for more than 30 minutes at a time. Take short walks on level surfaces as soon as pain allows.Try to increase the length of time you walk  each day.   Do not stay in bed.Resting more than 1 or 2 days can delay your recovery.   Do not avoid exercise or work.Your body is made to move.It is not dangerous to be active, even though your back may hurt.Your back will likely heal faster if you return to being active before your pain is gone.   Pay attention to your body when you bend and lift. Many people have less discomfortwhen lifting if they bend their knees, keep the load close to their bodies,and avoid twisting. Often, the most comfortable positions are those that put less stress on your recovering back.   Find a comfortable position to sleep. Use a firm mattress and lie on your side with your knees slightly bent. If you lie on your back, put a pillow under your knees.   Only take over-the-counter or prescription medicines as directed by your caregiver. Over-the-counter medicines to reduce pain and inflammation are often the most helpful.Your caregiver may prescribe muscle relaxant drugs.These medicines help dull your pain so you can more quickly return to your normal activities and healthy exercise.   Put ice on the injured area.   Put ice in a plastic bag.   Place a towel between your skin and the bag.   Leave the ice on for 15 to 20 minutes, 3 to 4 times a day for the first 2 to 3 days. After that, ice and heat may  be alternated to reduce pain and spasms.   Ask your caregiver about trying back exercises and gentle massage. This may be of some benefit.   Avoid feeling anxious or stressed.Stress increases muscle tension and can worsen back pain.It is important to recognize when you are anxious or stressed and learn ways to manage it.Exercise is a great option.  SEEK MEDICAL CARE IF:  You have pain that is not relieved with rest or medicine.   You have pain that does not improve in 1 week.   You have new symptoms.   You are generally not feeling well.  SEEK IMMEDIATE MEDICAL CARE IF:   You have pain that  radiates from your back into your legs.   You develop new bowel or bladder control problems.   You have unusual weakness or numbness in your arms or legs.   You develop nausea or vomiting.   You develop abdominal pain.   You feel faint.  Document Released: 07/13/2005 Document Revised: 07/02/2011 Document Reviewed: 12/01/2010 [facility] Patient Information 2012 [place], [place].    RESOURCE GUIDE  Dental Problems  Patients with Medicaid: [facility] [phone]                                           [phone] [address] Phone:  [phone]                                                  Phone:  [phone]  If unable to pay or uninsured, contact:  [facility] or [facility]. to become qualified for the adult dental clinic.  Chronic Pain Problems Contact [facility]  [phone] Patients need to be referred by their primary care doctor.  Insufficient Money for Medicine Contact [employer]:  call "211" or [facility] [phone].  No Primary Care Doctor Call [facility]  725-235-5850 Other agencies that provide inexpensive medical care    [facility]  [phone]    [facility]  [phone]    [facility]  [phone]    [facility]  [phone]    [facility]  [phone]    [facility]  [phone]  Psychological Services [facility]  [phone] [facility] Services  [phone] [facility]   [phone] (emergency services [phone])  Substance Abuse Resources [facility]  [phone] [facility] [phone] [facility] [place] 506-689-7135 Floydene Flock [phone] Residential & Outpatient Substance Abuse Program  619-295-6567  Abuse/Neglect [facility] Child Abuse Hotline 7853717182 [facility] Child Abuse Hotline (860) 119-6941 (After Hours)  Emergency  Shelter [facility] ([phone]  M[facility] at [facility] [place] [facility] 914-774-0923 [facility] ([phone]) [phone]  MRSA Hotline #:   (269)749-7091    [facility] Resources  [facility]     [employer]                          [facility] [address] [place]                       [address]      [address]  Blondell Reveal Phone:  [phone]                                   Phone:  [phone]                 Phone:  [phone]  [facility] Mental Health Phone:  [phone]  [facility] Child Abuse Hotline (712) 057-0725 904-700-7021 (After Hours)

## 2011-12-01 NOTE — ED Notes (Signed)
RT upper back pain.  Pt was admittied here for P.E.and pna. Was released last Thurs.  Was fine until today-started having severe RT upper back pain since 5pm.

## 2011-12-01 NOTE — ED Notes (Signed)
Pt. C/o right upper back pain she rates as a 10. Pt. Was given lots of pain meds when she left the hospital recently with a blood clot.  Does not normally take pain medications according to family and pt.  Recent diagnosis of right breast cancer with masectomy in April 2013 and ovary removal.  Last year she had left breast removed and 15 lymph nodes.  This new pain started this afternoon and pt. Reports she has never had it before.

## 2011-12-01 NOTE — ED Provider Notes (Signed)
History     CSN: [id number]  Arrival date & time 12/01/11  Brandi Bates   First MD Initiated Contact with Patient 12/01/11 2043      Chief Complaint  Patient presents with  . Back Pain   HPI   history provided by the patient. Patient is a 56 year old female with history of cholecystectomy, breast cancer status post mastectomy one month ago, history of PE currently on Coumadin who presents with complaints of sharp upper right back pain near scapula radiating through to the chest area. Pain began acutely around 5 PM this evening. She was at rest at that time. Patient also complains of slight shortness of breath. Pain is worse with breathing. She denies any other aggravating or alleviating factors. She denies any other recent symptoms. Pain is located higher than previous PE symptoms but on the same side. Patient denies any recent cough, hemoptysis, fever, chills or sweats. Patient denies any nausea vomiting symptoms.       Past Medical History  Diagnosis Date  . Numbness of feet   . GERD (gastroesophageal reflux disease)   . Chronic low back pain     "everyday"  . Eczema   . Lymphedema of arm     left  . History of radiation therapy 04/23/11 thru 06/08/11    L breast  . Breast wound   . PONV (postoperative nausea and vomiting)   . Migraines   . Neuromuscular disorder     raynauds syndrome   . Shortness of breath on exertion   . Anxiety   . Breast cancer 10/23/10    s/p L mastectomy, chemo/radiation, er/pr +, Her2 -  . Breast cancer 11/10/11    S/P right mastectomy  . Allergy   . Hepatic steatosis 11/20/11    severe   . Bladder cystocele 11/20/11    low-lying ct result  . Pulmonary thromboembolism 11/20/11    ct positive acute w/i segmental branches of right lower lobe    Past Surgical History  Procedure Date  . Abdominal hysterectomy   . Gallbladder surgery   . Breast surgery   . Irrigation and debridement abscess 06/16/2011    Procedure: IRRIGATION AND DEBRIDEMENT ABSCESS;   Surgeon: Shelly Rubenstein, MD;  Location: [facility] ORS;  Service: General;  Laterality: Left;  incision and drainage of left chest wall abcess  . Bilateral salpingoophorectomy 11/10/11    laparoscopy  . Diagnostic laparoscopy   . Mastectomy 11/10/11    right; w/SNB  . Mastectomy modified radical 10/23/11    left  . Cholecystectomy 1999  . Tubal ligation 1979  . Mastectomy w/ sentinel node biopsy 11/10/2011    Procedure: MASTECTOMY WITH SENTINEL LYMPH NODE BIOPSY;  Surgeon: Shelly Rubenstein, MD;  Location: [facility] OR;  Service: General;  Laterality: Right;  . Portacath placement     right subclavian    Family History  Problem Relation Age of Onset  . Cancer Mother     lung  . Hypertension Mother   . Cancer Father     lung  . Cancer Brother     BRAIN CANCER  . Hypertension Brother   . Cancer Cousin      2 PATERNAL COUSINS - BREAST CA  . Hypertension Sister   . Hypertension Maternal Uncle   . Hypertension Maternal Grandmother     History  Substance Use Topics  . Smoking status: Former Smoker -- 2.0 packs/day for 28 years    Types: Cigarettes    Quit date: 05/31/1997  .  Smokeless tobacco: Never Used  . Alcohol Use: No    OB History    Grav Para Term Preterm Abortions TAB SAB Ect Mult Living   2 2              Review of Systems  Constitutional: Negative for fever and chills.  HENT: Negative for neck pain.   Respiratory: Positive for shortness of breath. Negative for cough.   Cardiovascular: Positive for chest pain.  Gastrointestinal: Negative for nausea, vomiting, abdominal pain, diarrhea and constipation.  Genitourinary: Negative for dysuria, frequency, hematuria and flank pain.  Musculoskeletal: Positive for back pain.  Skin: Negative for rash.    Allergies  Oxycodone-acetaminophen; Morphine and related; Omeprazole magnesium; Gabapentin; and Latex  Home Medications   Current Outpatient Rx  Name Route Sig Dispense Refill  . CALCIUM CARBONATE ANTACID 500 MG PO CHEW  Oral Chew 1 tablet by mouth as needed.    Marland Kitchen CLONAZEPAM 0.5 MG PO TABS Oral Take 0.5 mg by mouth at bedtime as needed. For anxiety    . VITAMIN B-12 PO Oral Take 1 tablet by mouth daily.    . CYCLOBENZAPRINE HCL 10 MG PO TABS Oral Take 10 mg by mouth 3 (three) times daily as needed. For muscle pain.    Marland Kitchen GUAIFENESIN ER 600 MG PO TB12 Oral Take 600 mg by mouth 2 (two) times daily as needed. For congestion.    Marland Kitchen HYDROMORPHONE HCL 2 MG PO TABS Oral Take 1-2 tablets (2-4 mg total) by mouth every 4 (four) hours as needed for pain. 30 tablet 0  . LYSINE PO Oral Take 1 tablet by mouth daily.     . OXYCODONE HCL ER 20 MG PO TB12 Oral Take 1 tablet (20 mg total) by mouth every 12 (twelve) hours. 60 tablet 0  . OXYCODONE-ACETAMINOPHEN 5-325 MG PO TABS Oral Take 1 tablet by mouth every 6 (six) hours as needed. For pain.    Marland Kitchen POLYETHYLENE GLYCOL 3350 PO PACK Oral Take 17 g by mouth daily as needed. For constipation    . WARFARIN SODIUM 5 MG PO TABS Oral Take 2.5-5 mg by mouth daily. Take 1 tab daily except 0.5 tab daily on Mondays & Thursdays.      BP 159/113  Pulse 87  Temp 98.2 F (36.8 C)  Resp 18  SpO2 97%  Physical Exam  Nursing note and vitals reviewed. Constitutional: She is oriented to person, place, and time. She appears well-developed and well-nourished. No distress.  HENT:  Head: Normocephalic and atraumatic.  Neck: Normal range of motion. Neck supple.  Cardiovascular: Normal rate and regular rhythm.   Pulmonary/Chest: Effort normal and breath sounds normal. No respiratory distress. She has no wheezes. She has no rales.       Port in right chest. Bilateral mastectomies.  Abdominal: Soft. There is tenderness in the right upper quadrant and epigastric area. There is no rebound, no guarding, no CVA tenderness and negative Murphy's sign.       No CVA tenderness  Musculoskeletal: She exhibits no edema and no tenderness.       Back:  Neurological: She is alert and oriented to person, place,  and time.  Skin: Skin is warm and dry. No rash noted.  Psychiatric: Her behavior is normal. Her mood appears anxious.    ED Course  Procedures   Results for orders placed during the hospital encounter of 12/01/11  CBC      Component Value Range   WBC 5.4  4.0 -  10.5 (K/uL)   RBC 3.88  3.87 - 5.11 (MIL/uL)   Hemoglobin 10.7 (*) 12.0 - 15.0 (g/dL)   HCT 16.1 (*) 09.6 - 46.0 (%)   MCV 85.1  78.0 - 100.0 (fL)   MCH 27.6  26.0 - 34.0 (pg)   MCHC 32.4  30.0 - 36.0 (g/dL)   RDW 04.5  40.9 - 81.1 (%)   Platelets 351  150 - 400 (K/uL)  DIFFERENTIAL      Component Value Range   Neutrophils Relative 73  43 - 77 (%)   Neutro Abs 4.0  1.7 - 7.7 (K/uL)   Lymphocytes Relative 18  12 - 46 (%)   Lymphs Abs 0.9  0.7 - 4.0 (K/uL)   Monocytes Relative 5  3 - 12 (%)   Monocytes Absolute 0.3  0.1 - 1.0 (K/uL)   Eosinophils Relative 4  0 - 5 (%)   Eosinophils Absolute 0.2  0.0 - 0.7 (K/uL)   Basophils Relative 0  0 - 1 (%)   Basophils Absolute 0.0  0.0 - 0.1 (K/uL)  PROTIME-INR      Component Value Range   Prothrombin Time 29.7 (*) 11.6 - 15.2 (seconds)   INR 2.77 (*) 0.00 - 1.49   BASIC METABOLIC PANEL      Component Value Range   Sodium 141  135 - 145 (mEq/L)   Potassium 3.7  3.5 - 5.1 (mEq/L)   Chloride 105  96 - 112 (mEq/L)   CO2 23  19 - 32 (mEq/L)   Glucose, Bld 127 (*) 70 - 99 (mg/dL)   BUN 8  6 - 23 (mg/dL)   Creatinine, Ser 9.14  0.50 - 1.10 (mg/dL)   Calcium 9.0  8.4 - 78.2 (mg/dL)   GFR calc non Af Amer >90  >90 (mL/min)   GFR calc Af Amer >90  >90 (mL/min)       1. Back pain       MDM  8:50 PM patient seen and evaluated. Patient in no acute distress.   Pt discussed with Attending Physician.  Pt is therapeutic with Coumadin. Respirations normal, O2 saturation normal patient in no respiratory distress. Patient also with normal heart rate.  Patient reports feeling much better after pain medications. At this time we'll discharge home with continue followup with  PCP.      Angus Seller, [place] 12/02/11 [phone]

## 2011-12-01 NOTE — ED Notes (Signed)
Pt has port-a-cath to be accessed. Will get labs if unable per RN.

## 2011-12-02 ENCOUNTER — Ambulatory Visit
Admission: RE | Admit: 2011-12-02 | Discharge: 2011-12-02 | Disposition: A | Discharge status: Home / Self Care | Payer: BC Managed Care – PPO | Source: Ambulatory Visit | Attending: Radiation Oncology | Admitting: Radiation Oncology

## 2011-12-02 ENCOUNTER — Encounter: Payer: Self-pay | Admitting: *Deleted

## 2011-12-02 VITALS — BP 172/98 | HR 96 | Temp 97.8°F | Resp 20

## 2011-12-02 DIAGNOSIS — R0602 Shortness of breath: Secondary | ICD-10-CM | POA: Insufficient documentation

## 2011-12-02 DIAGNOSIS — C50419 Malignant neoplasm of upper-outer quadrant of unspecified female breast: Secondary | ICD-10-CM

## 2011-12-02 DIAGNOSIS — C50411 Malignant neoplasm of upper-outer quadrant of right female breast: Secondary | ICD-10-CM | POA: Insufficient documentation

## 2011-12-02 DIAGNOSIS — C50919 Malignant neoplasm of unspecified site of unspecified female breast: Secondary | ICD-10-CM | POA: Insufficient documentation

## 2011-12-02 NOTE — Progress Notes (Signed)
Radiation Oncology         [phone] ________________________________  Name: Brandi Bates MRN: [id number]  Date: 12/02/2011  DOB: November 19, 1955  Follow-Up Visit Note  CC: Gaye Alken, MD, MD  Magrinat, Valentino Hue, MD, Abigail Miyamoto, M.D.  Diagnosis:   Recent diagnosis of right-sided breast cancer: Invasive ductal carcinoma, T1 B. N0 M0. The patient also has a history of locally advanced left-sided breast cancer.  Interval Since Last Radiation:  Proximally 6 months for treatment of left-sided breast cancer   Narrative:  The patient returns today for routine follow-up.  The patient is seen today regarding her diagnosis of right-sided breast cancer. The patient did have a locally advanced left-sided breast cancer and she received local/regional radiation treatment on the left which was completed in November of 2012. Since that time the patient was placed on tamoxifen but was found to have a right-sided breast cancer. An initial biopsy did return positive for invasive mammary carcinoma. This appeared to be an early stage tumor but the patient decided to proceed with a mastectomy given the fact that this had recurred on the other side were was a new second primary on this side. Given this fact she decided to proceed with more aggressive surgery and a simple mastectomy and sentinel lymph node evaluation was completed on 11/10/2011. This returned positive for an invasive ductal carcinoma which was a grade 1 tumor. This measured 0.7 cm. The margins were widely negative. No lymphovascular space invasion was seen. 4 sentinel lymph nodes were evaluated, non-demonstrated carcinoma. There is insufficient tumor for estrogen receptor, progesterone receptor, and proliferative index analysis. The tumor is HER-2/neu negative.  The patient indicates that she has been healing relatively well. She was found to have a PE within the last couple of weeks. She was in fact in the emergency room last night  complaining of some back pain and some shortness of breath. She does have some pain medications including dialogue it and oxycodone and also has been prescribed a fentanyl patch.                              ALLERGIES:  is allergic to oxycodone-acetaminophen; morphine and related; omeprazole magnesium; gabapentin; and latex.  Meds: Current Outpatient Prescriptions  Medication Sig Dispense Refill  . calcium carbonate (TUMS - DOSED IN MG ELEMENTAL CALCIUM) 500 MG chewable tablet Chew 1 tablet by mouth as needed.      . clonazePAM (KLONOPIN) 0.5 MG tablet Take 0.5 mg by mouth at bedtime as needed. For anxiety      . Cyanocobalamin (VITAMIN B-12 PO) Take 1 tablet by mouth daily.      . cyclobenzaprine (FLEXERIL) 10 MG tablet Take 10 mg by mouth 3 (three) times daily as needed. For muscle pain.      Marland Kitchen guaiFENesin (MUCINEX) 600 MG 12 hr tablet Take 600 mg by mouth 2 (two) times daily as needed. For congestion.      Marland Kitchen HYDROmorphone (DILAUDID) 2 MG tablet Take 1-2 tablets (2-4 mg total) by mouth every 4 (four) hours as needed for pain.  30 tablet  0  . LYSINE PO Take 1 tablet by mouth daily.       Marland Kitchen oxyCODONE (OXYCONTIN) 20 MG 12 hr tablet Take 1 tablet (20 mg total) by mouth every 12 (twelve) hours.  60 tablet  0  . oxyCODONE-acetaminophen (PERCOCET) 5-325 MG per tablet Take 1 tablet by mouth every 6 (six)  hours as needed. For pain.      . polyethylene glycol (MIRALAX / GLYCOLAX) packet Take 17 g by mouth daily as needed. For constipation      . warfarin (COUMADIN) 5 MG tablet Take 2.5-5 mg by mouth daily. Take 1 tab daily except 0.5 tab daily on Mondays & Thursdays.      Marland Kitchen DISCONTD: gabapentin (NEURONTIN) 300 MG capsule daily.       No current facility-administered medications for this encounter.   Facility-Administered Medications Ordered in Other Encounters  Medication Dose Route Frequency Provider Last Rate Last Dose  . heparin lock flush 100 unit/mL  500 Units Intracatheter Prior to discharge  Lyanne Co, MD   500 Units at 12/01/11 2320  . HYDROmorphone (DILAUDID) injection 1 mg  1 mg Intravenous Once Angus Seller, PA   1 mg at 12/01/11 2137  . sodium chloride 0.9 % bolus 1,000 mL  1,000 mL Intravenous Once Angus Seller, PA   1,000 mL at 12/01/11 2137  . DISCONTD: sodium chloride 0.9 % injection 10-40 mL  10-40 mL Intracatheter PRN Lyanne Co, MD   10 mL at 12/01/11 2320    Physical Findings: The patient is in no acute distress. Patient is alert and oriented.  vitals were not taken for this visit..     Lab Findings: Lab Results  Component Value Date   WBC 5.4 12/01/2011   HGB 10.7* 12/01/2011   HCT 33.0* 12/01/2011   MCV 85.1 12/01/2011   PLT 351 12/01/2011     Radiographic Findings: Ct Abdomen Pelvis W Wo Contrast  11/20/2011  *RADIOLOGY REPORT*  Clinical Data: Acute onset back pain, right breast cancer status post mastectomy, history of left mastectomy, total abdominal hysterectomy, and cholecystectomy.  CT ABDOMEN AND PELVIS WITHOUT AND WITH CONTRAST  Technique:  Multidetector CT imaging of the abdomen and pelvis was performed without contrast material in one or both body regions, followed by contrast material(s) and further sections in one or both body regions.  Contrast: OMNIPAQUE IOHEXOL 300 MG/ML  SOLN  Comparison: PET CT dated 10/10/2010.  Findings: Patchy right lower lobe opacities, suspicious for pneumonia.  Associated small right pleural effusion.  Status post right mastectomy with subcutaneous drain.  Severe hepatic steatosis.  Spleen, pancreas, and adrenal glands within normal limits.  Status post cholecystectomy.  No intrahepatic or extrahepatic ductal dilatation.  Kidneys are within normal limits.  No enhancing renal lesions.  No renal calculi or hydronephrosis.  No evidence of bowel obstruction. Normal appendix.  Moderate stool in the right colon.  No evidence of abdominal aortic aneurysm.  No abdominopelvic ascites.  No suspicious abdominopelvic  lymphadenopathy.  Status post hysterectomy.  No adnexal masses.  No ureteral or bladder calculi.  Low-lying bladder/cystocele.  Mild degenerative changes of the visualized thoracolumbar spine. Left partial sacralization of L5.  IMPRESSION: No renal, ureteral, or bladder calculi.  No hydronephrosis.  Normal appendix.  No evidence of bowel obstruction.  Moderate stool in the right colon.  Patchy right lower lobe opacities, suspicious for pneumonia. Associated small right pleural effusion.  Severe hepatic steatosis.  Low-lying bladder/cystocele.  Status post right mastectomy with indwelling surgical drain.  Original Report Authenticated By: Charline Bills, M.D.   Ct Angio Chest W/cm &/or Wo Cm  11/20/2011  *RADIOLOGY REPORT*  Clinical Data: Back pain  CT ANGIOGRAPHY CHEST  Technique:  Multidetector CT imaging of the chest using the standard protocol during bolus administration of intravenous contrast. Multiplanar reconstructed images including MIPs  were obtained and reviewed to evaluate the vascular anatomy.  Contrast: 63mL OMNIPAQUE IOHEXOL 300 MG/ML  SOLN  Comparison: 10/10/2010  Findings: There is filling defect within a segmental branch of the right lower lobe extending into the lateral basal segment.  There is also pulmonary thromboembolism within segmental branches of the posterior basal segment.  These can be seen on images 130 and 140 of series 11.  The right subclavian Port-A-Cath tip in the SVC.  No evidence of abnormal adenopathy.  Left axillary and subpectoral adenopathy resolved.  Changes from bilateral mastectomy are noted.  A surgical drain is seen in the right mastectomy operative bed.  Inflammatory nodes in the right axilla.  Small right pleural effusion.  Heterogeneous opacities in the anterior left upper lobe likely related to radiation changes.  Airspace opacities in the right lower lobe.  These are associated with ground-glass and interlobular septal thickening.  IMPRESSION: The study is  positive for acute pulmonary thromboembolism within segmental branches of the right lower lobe.  Right pleural effusion.  Right lower lobe airspace opacities.  Postoperative changes.  Critical Value/emergent results were called by telephone at the time of interpretation on 11/20/2011  at 1811 hours  to  Dr. Lynelle Doctor, who verbally acknowledged these results.  Original Report Authenticated By: Donavan Burnet, M.D.    Impression:    Pleasant 56 year old female who presents today for consideration of additional radiation treatment. She is status post a simple mastectomy for a new right-sided breast cancer with a history of locally danced left-sided breast cancer status post radiation treatment.  Plan:  I do not recommend any radiation for her case on the right. This was an early stage tumor and does not meet the recommended criteria for postmastectomy radiation and there were no significant worrisome findings which would change this recommendation in my opinion. I discussed this with the patient and all of her questions were answered. I am not going to schedule any routine followup with her but would be happy to see her at any point in the future.  I spent 15 minutes with the patient today, the majority of which was spent counseling the patient on the diagnosis of cancer and coordinating care.   Radene Gunning, M.D., Ph.D.

## 2011-12-02 NOTE — Progress Notes (Signed)
Addendum, patients right chest wall incision with steri-strips , intact,healing, dry, patient still short of breast, 97% room air, not using her fentanyl patch because husband fear of increased shortness breath as side effect, patient didn't have a cxr last night in the ED, her pain now is an "8" right scapula on her back, takes dilaudid and oxycodone this am little relief, granddaughter with patient, patient not sure why she is here  And why she would need radiation treatment with her having a mastectomy and early stage cancer 9:02 AM

## 2011-12-02 NOTE — Progress Notes (Signed)
REcon  Right Breast Cancer  Right Mastectomy sentinel node bxwith B/L salpingo-oopherectomy, on 11/10/11 = right breast=invasive ductal ca grade I/III  Post-op developed Pulmonary Embolism on Coumadin therapy  Left Mastectomy 09/2010 s/p Radiation treatment =  04/23/2011-06/08/2011    Patient alert,oriented x3,steady slow gait, holding pillow under right arm, was at ED last night, right scapula back pain, was given pain meds and sent home,  Depression,

## 2011-12-02 NOTE — ED Provider Notes (Signed)
Medical screening examination/treatment/procedure(s) were performed by non-physician practitioner and as supervising physician I was immediately available for consultation/collaboration.   Lyanne Co, MD 12/02/11 [phone]

## 2011-12-03 ENCOUNTER — Other Ambulatory Visit: Payer: BC Managed Care – PPO | Admitting: Lab

## 2011-12-07 ENCOUNTER — Other Ambulatory Visit: Payer: BC Managed Care – PPO | Admitting: Lab

## 2011-12-07 ENCOUNTER — Ambulatory Visit (INDEPENDENT_AMBULATORY_CARE_PROVIDER_SITE_OTHER): Payer: BC Managed Care – PPO | Admitting: Surgery

## 2011-12-07 ENCOUNTER — Encounter (INDEPENDENT_AMBULATORY_CARE_PROVIDER_SITE_OTHER): Payer: Self-pay | Admitting: Surgery

## 2011-12-07 VITALS — BP 122/86 | HR 85 | Temp 97.4°F | Ht 64.0 in | Wt 161.0 lb

## 2011-12-07 DIAGNOSIS — Z09 Encounter for follow-up examination after completed treatment for conditions other than malignant neoplasm: Secondary | ICD-10-CM

## 2011-12-07 NOTE — Progress Notes (Signed)
Subjective:     Patient ID: Brandi Bates, female   DOB: May 29, 1956, 56 y.o.   MRN: [id number]  HPI  She is here for another visit. She feels much better since she started a fentanyl patch. She still has some discomfort but has been able to take much less narcotics. She has no shortness of breath Review of Systems     Objective:   Physical Exam On exam, her mastectomy site is well healing    Assessment:     Patient status post right mastectomy for breast cancer with postoperative pulmonary embolism    Plan:     She is continuing the Coumadin. I will renew the fentanyl patch. I will see her back in 2-3 weeks

## 2011-12-08 ENCOUNTER — Ambulatory Visit: Payer: BC Managed Care – PPO | Admitting: Pharmacist

## 2011-12-08 ENCOUNTER — Ambulatory Visit (HOSPITAL_BASED_OUTPATIENT_CLINIC_OR_DEPARTMENT_OTHER): Payer: BC Managed Care – PPO | Admitting: Oncology

## 2011-12-08 ENCOUNTER — Other Ambulatory Visit: Payer: BC Managed Care – PPO | Admitting: Lab

## 2011-12-08 ENCOUNTER — Other Ambulatory Visit (HOSPITAL_BASED_OUTPATIENT_CLINIC_OR_DEPARTMENT_OTHER): Payer: BC Managed Care – PPO | Admitting: Lab

## 2011-12-08 VITALS — BP 111/69 | HR 77 | Temp 98.9°F | Ht 64.0 in | Wt 163.3 lb

## 2011-12-08 DIAGNOSIS — C50919 Malignant neoplasm of unspecified site of unspecified female breast: Secondary | ICD-10-CM

## 2011-12-08 DIAGNOSIS — I2699 Other pulmonary embolism without acute cor pulmonale: Secondary | ICD-10-CM

## 2011-12-08 DIAGNOSIS — Z170 Estrogen receptor positive status [ER+]: Secondary | ICD-10-CM

## 2011-12-08 DIAGNOSIS — Z9010 Acquired absence of unspecified breast and nipple: Secondary | ICD-10-CM

## 2011-12-08 LAB — CBC WITH DIFFERENTIAL/PLATELET
Basophils Absolute: 0 10*3/uL (ref 0.0–0.1)
EOS%: 10.6 % — ABNORMAL HIGH (ref 0.0–7.0)
Eosinophils Absolute: 0.4 10*3/uL (ref 0.0–0.5)
HCT: 35.3 % (ref 34.8–46.6)
HGB: 11.6 g/dL (ref 11.6–15.9)
LYMPH%: 23 % (ref 14.0–49.7)
MCH: 27.3 pg (ref 25.1–34.0)
MCV: 83.1 fL (ref 79.5–101.0)
MONO%: 6.5 % (ref 0.0–14.0)
NEUT#: 2.5 10*3/uL (ref 1.5–6.5)
NEUT%: 59.7 % (ref 38.4–76.8)
Platelets: 248 10*3/uL (ref 145–400)
RDW: 14.2 % (ref 11.2–14.5)

## 2011-12-08 LAB — PROTIME-INR: INR: 3 (ref 2.00–3.50)

## 2011-12-08 NOTE — Progress Notes (Signed)
Addended by: Amaryllis Dyke on: 12/08/2011 05:04 PM   Modules accepted: Orders

## 2011-12-08 NOTE — Progress Notes (Signed)
ID: Brandi Bates   DOB: 16-Jun-1956  MR#: [id number]  HGD#:[id number]  HISTORY OF PRESENT ILLNESS: The patient felt a mass in her left axilla several months ago.  She did not bring this to her physician's attention because she wasn't sure it really meant anything, but she asked her daughter, and her daughter asked a nurse fried, and they told her she better get on and have this evaluated, so she did bring it to [name] attention and he set her up for diagnostic mammography and ultrasonography performed at [facility] on September 22, 2010.  Dr. [name] was able to demonstrate a spiculated density at the 12 o'clock position of the breast with pleomorphic calcifications measuring about 4 cm.  There was a 2nd focal rounded area of increased density and with the axilla, there was an ovoid mass measuring up to 3.8 cm. Ultrasound showed the area of heterogeneous decreased echogenicity as well as the 2nd discrete focus.  The right breast showed only a cystic mass measuring 1 cm which was of no consequence.  Biopsy was performed of the left breast mass only on the same day.  The pathology report ([id number]) biopsied 2 masses in the left breast.  The second mass was "located more superiorly" and was also identified and biopsied.  The pathology report labels both masses "at 12 o'clock" so we will have to clarify this issue.  At any rate, both masses were Grade 3 invasive ductal carcinoma.  The first one was estrogen receptor positive at 61% but progesterone receptor negative with a proliferation marker of 95%.  The second one was ER positive at 98% and PR "positive" at 3%, also with a proliferation marker of 95%.  Both showed no HER2 amplification.  With this information, the patient was referred for breast MRI. This was performed September 30, 2010 and showed the main breast mass to measure 3.8 cm.  The posterior mass measured 6 millimeters. There were some confluent axillary masses to a maximum measurement of 6.8 cm. The  patient underwent definitive surgery 10/23/2011 with results and subsequent treatment as detailed below   INTERVAL HISTORY: The interval history since Brandi Bates his last visit here has been even and 4. Of course she had her contralateral mastectomy, with results as noted below area 10 days later she developed shortness of breath and a cough and was found by CT angiogram have a pulmonary embolus. She is here today with her granddaughter for followup of these new issues and to discuss resumption of antiestrogen therapy.Marland Kitchen  REVIEW OF SYSTEMS: She still pretty weak, uses a walker at home, but is much less short of breath and her cough has resolved. There has been no hemoptysis, no fever and no pleurisy since she left the hospital. Of course she is on Coumadin. There has not been any epistaxis, bright red blood per to him, hematuria, or unusual bruising. She feels achy related to the prior surgeries and somewhat depressed. 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 She wonders if she would be able to obtain some disability help. Otherwise a detailed review of systems today was stable  Past Medical History  Diagnosis Date  . Numbness of feet   . GERD (gastroesophageal reflux disease)   . Chronic low back pain     "everyday"  . Eczema   . Lymphedema of arm     left  . History of radiation therapy 04/23/11 thru 06/08/11    L breast  . Breast  wound   . PONV (postoperative nausea and vomiting)   . Migraines   . Neuromuscular disorder     raynauds syndrome   . Shortness of breath on exertion   . Anxiety   . Breast cancer 10/23/10    s/p L mastectomy, chemo/radiation, er/pr +, Her2 -  . Breast cancer 11/10/11    S/P right mastectomy  . Allergy   . Hepatic steatosis 11/20/11    severe   . Bladder cystocele 11/20/11    low-lying ct result  . Pulmonary thromboembolism 11/20/11    ct positive acute w/i segmental branches of right lower lobe  Significant for history of migraines, history of  GERD, history of chronic constipation, history of chronic low back pain, history of tobacco abuse, the patient quitting in 1998 with approximately a 50 pack-year history prior to that.  The patient is status post tubal ligation, status post simple hysterectomy without salpingo-oophorectomy and status post cholecystectomy.  PAST SURGICAL HISTORY: Past Surgical History  Procedure Date  . Abdominal hysterectomy   . Gallbladder surgery   . Breast surgery   . Irrigation and debridement abscess 06/16/2011    Procedure: IRRIGATION AND DEBRIDEMENT ABSCESS;  Surgeon: Shelly Rubenstein, MD;  Location: [facility];  Service: General;  Laterality: Left;  incision and drainage of left chest wall abcess  . Bilateral salpingoophorectomy 11/10/11    laparoscopy  . Diagnostic laparoscopy   . Mastectomy 11/10/11    right; w/SNB  . Mastectomy modified radical 10/23/11    left  . Cholecystectomy 1999  . Tubal ligation 1979  . Mastectomy w/ sentinel node biopsy 11/10/2011    Procedure: MASTECTOMY WITH SENTINEL LYMPH NODE BIOPSY;  Surgeon: Shelly Rubenstein, MD;  Location: [facility] OR;  Service: General;  Laterality: Right;  . Portacath placement     right subclavian    FAMILY HISTORY Family History  Problem Relation Age of Onset  . Cancer Mother     lung  . Hypertension Mother   . Cancer Father     lung  . Cancer Brother     BRAIN CANCER  . Hypertension Brother   . Cancer Cousin      2 PATERNAL COUSINS - BREAST CA  . Hypertension Sister   . Hypertension Maternal Uncle   . Hypertension Maternal Grandmother   The patient's father died at the age of 80 and the patient's mother at the age of 62.  Both were smokers and both had lung cancer. The patient had one brother who died at age 77 with glioblastoma multiforme.  She has one surviving brother, one surviving sister who is present today, and a half-brother.  There are two cousins, both on the father's side, who had breast cancer in their 46s.  There is one uncle  with Brandi Bates disease.  GYNECOLOGIC HISTORY: She is GX P1.  First pregnancy to term at age 56.  She never took hormone replacement.    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  Her husband, [name], is a Therapist, music for an [employer].  Daughter [name] works at the same office as her mother.  Son [name] works for [employer].  The patient has 7 grandchildren. She attends a [employer].     ADVANCED DIRECTIVES: in place  HEALTH MAINTENANCE: History  Substance Use Topics  . Smoking status: Former Smoker -- 2.0 packs/day for 28  years    Types: Cigarettes    Quit date: 05/31/1997  . Smokeless tobacco: Never Used  . Alcohol Use: No     Colonoscopy:  PAP: s/p hysterectomy  Bone density: SOLIS October 2012, T - 1.4 at [facility]  Lipid panel:  Allergies  Allergen Reactions  . Morphine And Related Hives and Itching    All over the body  . Omeprazole Magnesium Nausea Only  . Gabapentin Other (See Comments)    Unable to sleep  . Latex Rash    Only where touched    Current Outpatient Prescriptions  Medication Sig Dispense Refill  . calcium carbonate (TUMS - DOSED IN MG ELEMENTAL CALCIUM) 500 MG chewable tablet Chew 1 tablet by mouth as needed.      . clonazePAM (KLONOPIN) 0.5 MG tablet Take 0.5 mg by mouth at bedtime as needed. For anxiety      . Cyanocobalamin (VITAMIN B-12 PO) Take 1 tablet by mouth daily.      . cyclobenzaprine (FLEXERIL) 10 MG tablet Take 10 mg by mouth 3 (three) times daily as needed. For muscle pain.      Marland Kitchen guaiFENesin (MUCINEX) 600 MG 12 hr tablet Take 600 mg by mouth 2 (two) times daily as needed. For congestion.      Marland Kitchen LYSINE PO Take 1 tablet by mouth daily.       Marland Kitchen oxyCODONE (OXYCONTIN) 20 MG 12 hr tablet Take 1 tablet (20 mg total) by mouth every 12 (twelve) hours.  60 tablet  0  . oxyCODONE-acetaminophen (PERCOCET) 5-325 MG per tablet Take 1 tablet by  mouth every 6 (six) hours as needed. For pain.      . polyethylene glycol (MIRALAX / GLYCOLAX) packet Take 17 g by mouth daily as needed. For constipation      . warfarin (COUMADIN) 5 MG tablet Take 2.5-5 mg by mouth daily. Take 1 tab daily except 0.5 tab daily on Mondays & Thursdays.      Marland Kitchen DISCONTD: gabapentin (NEURONTIN) 300 MG capsule daily.        OBJECTIVE:  Filed Vitals:   12/08/11 1636  BP: 111/69  Pulse: 77  Temp: 98.9 F (37.2 C)     Body mass index is 28.03 kg/(m^2).    ECOG FS: 2  PHYSICAL EXAM: Middle-aged white woman who appears depressed Sclerae unicteric Oropharynx clear No peripheral adenopathy, and specifically there is no axillary adenopathy Lungs no rales or rhonchi Heart regular rate and rhythm Abd benign MSK no focal spinal tenderness, no peripheral edema Neuro: nonfocal Breasts: Status post bilateral mastectomies. There is no evidence of local recurrence. The right-sided wound is healing nicely.  LAB RESULTS: Lab Results  Component Value Date   WBC 4.2 12/08/2011   NEUTROABS 2.5 12/08/2011   HGB 11.6 12/08/2011   HCT 35.3 12/08/2011   MCV 83.1 12/08/2011   PLT 248 12/08/2011      Chemistry      Component Value Date/Time   NA 141 12/01/2011 2133   K 3.7 12/01/2011 2133   CL 105 12/01/2011 2133   CO2 23 12/01/2011 2133   BUN 8 12/01/2011 2133   CREATININE 0.59 12/01/2011 2133      Component Value Date/Time   CALCIUM 9.0 12/01/2011 2133   ALKPHOS 57 11/21/2011 0820   AST 14 11/21/2011 0820   ALT 19 11/21/2011 0820   BILITOT 0.5 11/21/2011 0820       Lab Results  Component Value Date   LABCA2 22 09/10/2011  Lab 12/08/11 1554  INR 3.00    No results found for this basename: UACOL:1,UAPR:1,USPG:1,UPH:1,UTP:1,UGL:1,UKET:1,UBIL:1,UHGB:1,UNIT:1,UROB:1,ULEU:1,UEPI:1,UWBC:1,URBC:1,UBAC:1,CAST:1,CRYS:1,UCOM:1,BILUA:1 in the last 72 hours   STUDIES: No new results found.  ASSESSMENT: A 56 year old [place] woman with a BRCA mutation of unknown clinical  significance  (1) status post left modified radical mastectomy March of 2012 for a T3 N3a (stage IIIC)  invasive ductal carcinoma, grade 3,  which was strongly estrogen receptor positive, progesterone receptor and HER2 negative, with an MIB-1 of 95%.   (2) s/p 4 cycles of adjuvant dose dense doxorubicin and cyclophosphamide, and 11 doses of weekly paclitaxel,   (3) s/p radiation to the left chest and regional nodes completed November 2012, at which time she started tamoxifen  (4) s/p right mastectomy with sentinel lymph node sampling 11/10/2011 for a pT1b pN0, stage IA invasive ductal carcinoma, grade 1, with insufficient tumor for estrogen and progesterone testing, but HER-2 negative.  (5) anastrozole started May 2013  (6) post-op pulmonary embolus documented 11/20/2011  PLAN: Today we discussed first of all her breast cancer prognosis. This second cancer of will require no radiation or chemotherapy. It is very likely cured with surgery alone. Nevertheless the anti-estrogens that she will continue to take for treatment of her more dangerous left-sided cancer will further reduce the already low risk of the right-sided cancers recurrence (note that the right-sided grade 1 HER-2 negative tumor I did not provide enough tissue for estrogen receptor evaluation; nevertheless the chances of it being estrogen receptor positive are better than 80%).  We do need to continue anti-estrogen therapy because of a left-sided breast cancer. Note only did the second cancer growth to tamoxifen, but tamoxifen probably contributed to some extent to her pulmonary embolus even though she had stopped a few weeks prior to that event (tamoxifen can take up to 4 months to completely clear from the blood). Accordingly we should go with an aromatase inhibitor, and today we discussed anastrozole in detail. She has a good understanding of the possible toxicities side effects and complications of this medication. I went ahead and  wrote her a prescription and she will return to see me in about 3 months to make sure she is tolerating it well.  Finally today her INR was 3.0. This is in the therapeutic range but higher than it needs to be and what I suggested is that she go from  5 mg daily except on Tuesdays and Thursdays to alternating 5 mg with 2.5 mg. She has a good understanding of how to do this, and this information was given to her in writing. She already has a repeat prothrombin time to be checked through our Coumadin clinic next week. Once she is on a stable dose of we can start checking her on a monthly basis. It my plan is to continue Coumadin for a total of 6 months.   Faithlynn Deeley C    12/08/2011

## 2011-12-08 NOTE — Progress Notes (Signed)
INR = 3 Pt took 2.5 mg once last week (rather than twice) & 5 mg/day otherwise. She missed a dose on Friday (5/10). I reviewed the dosage we had agreed upon at last visit & we'll try to get her to comply with that over the next week or so --> 5 mg/day; 2.5 mg Mon & Thurs. Repeat protime in 10 days. Marily Lente, Pharm.D.

## 2011-12-09 ENCOUNTER — Telehealth (INDEPENDENT_AMBULATORY_CARE_PROVIDER_SITE_OTHER): Payer: Self-pay | Admitting: General Surgery

## 2011-12-09 NOTE — Telephone Encounter (Signed)
Attempted to call the patient back but she would not accept blocked call.

## 2011-12-10 ENCOUNTER — Other Ambulatory Visit (INDEPENDENT_AMBULATORY_CARE_PROVIDER_SITE_OTHER): Payer: Self-pay | Admitting: General Surgery

## 2011-12-10 ENCOUNTER — Telehealth (INDEPENDENT_AMBULATORY_CARE_PROVIDER_SITE_OTHER): Payer: Self-pay | Admitting: General Surgery

## 2011-12-10 ENCOUNTER — Other Ambulatory Visit (INDEPENDENT_AMBULATORY_CARE_PROVIDER_SITE_OTHER): Payer: Self-pay | Admitting: Surgery

## 2011-12-10 DIAGNOSIS — R339 Retention of urine, unspecified: Secondary | ICD-10-CM

## 2011-12-10 NOTE — Telephone Encounter (Signed)
MS Pitzer CALLED TO REPORT THAT SHE " CALLED THE ON-CALL DR. LAST NIGHT AND NO ONE CALLED HER BACK" THIS WAS REGARDING INABILITY TO VOID COMPLETELY. SHE HAAS THE URGE FREQUENTLY TO VOID AND ONLY VOIDS A SMALL AMOUNT. SHE DOES NOT FEEL THAT HER BLADDER IS DISTENDED, NO PAIN OR PRESSURE REPORTED. SHE DID REMOVE HER FENTANYL PATCH YESTERDAY MORNING SINCE HER PAIN HAD IMPROVED. SHE DID NOT KNOW IF THAT WAS RELATED. I REVIEWED THIS WITH DR. BLACKMAN AND HE SAID TO ORDER A URINALYSIS TO BE DONE TODAY AND IF MS [place] BECAME UNCOMFORTABLE WITH PRESSURE THAT SHE MAY NEED TO GO TO [facility] FOR IN AND OUT CATHETERIZATION. PT ADVISED TO HAVE URINALYSIS DONE THIS AM./GY

## 2011-12-11 ENCOUNTER — Encounter (INDEPENDENT_AMBULATORY_CARE_PROVIDER_SITE_OTHER): Payer: Self-pay | Admitting: Surgery

## 2011-12-11 ENCOUNTER — Encounter: Payer: Self-pay | Admitting: Pharmacist

## 2011-12-11 ENCOUNTER — Telehealth (INDEPENDENT_AMBULATORY_CARE_PROVIDER_SITE_OTHER): Payer: Self-pay | Admitting: General Surgery

## 2011-12-11 LAB — URINALYSIS, ROUTINE W REFLEX MICROSCOPIC
Bilirubin Urine: NEGATIVE
Glucose, UA: NEGATIVE mg/dL
Ketones, ur: NEGATIVE mg/dL
Protein, ur: NEGATIVE mg/dL
pH: 8 (ref 5.0–8.0)

## 2011-12-11 LAB — URINALYSIS, MICROSCOPIC ONLY: Casts: NONE SEEN

## 2011-12-11 NOTE — Telephone Encounter (Signed)
Called pt and she was not feeling any better, I called her in some Cprio 500mg  1 bid  at the [facility] in [place] and told her if she needs [place] that Dr Magnus Ivan is here all day on Monday

## 2011-12-11 NOTE — Progress Notes (Signed)
Pt called Coumadin clinic & left msg that she has UTI.  She is now taking AZO & Cranberry supplement.  She asked if these interacted w/ Coumadin. I called pt back at home & a friend, Nettie Elm, stated Ms. Lemonds was asleep.  I gave Nettie Elm information that AZO & Cranberry had very little to no effect on INR (small studies w/ Cranberry showed insignificant changes in INR).   Nettie Elm stated Ms. Qadri was starting on Cipro (RX by her Careers adviser).  Not sure length of Cipro course. Since Cipro can increase INR, I instructed Nettie Elm to give Ms. Bergin directions to decrease her Coumadin dose to 2.5 mg/day; 5 mg Fri & Mon. Keep appt for 12/17/11 for INR check.  Nettie Elm repeated information & dosing instructions to me w/ understanding. Marily Lente, Pharm.D.

## 2011-12-11 NOTE — Telephone Encounter (Signed)
Called pt and she started that she was not feeling any better so I told her that I was going to call in her some

## 2011-12-14 ENCOUNTER — Other Ambulatory Visit: Payer: BC Managed Care – PPO | Admitting: Lab

## 2011-12-15 ENCOUNTER — Ambulatory Visit: Payer: BC Managed Care – PPO | Admitting: Physician Assistant

## 2011-12-15 NOTE — Progress Notes (Signed)
Encounter addended by: Lowella Petties, RN on: 12/15/2011 10:01 AM<BR>     Documentation filed: Charges VN

## 2011-12-17 ENCOUNTER — Other Ambulatory Visit: Payer: BC Managed Care – PPO | Admitting: Lab

## 2011-12-17 ENCOUNTER — Other Ambulatory Visit: Payer: Self-pay | Admitting: *Deleted

## 2011-12-17 ENCOUNTER — Ambulatory Visit (HOSPITAL_BASED_OUTPATIENT_CLINIC_OR_DEPARTMENT_OTHER): Payer: BC Managed Care – PPO | Admitting: Pharmacist

## 2011-12-17 DIAGNOSIS — I2699 Other pulmonary embolism without acute cor pulmonale: Secondary | ICD-10-CM

## 2011-12-17 DIAGNOSIS — C50419 Malignant neoplasm of upper-outer quadrant of unspecified female breast: Secondary | ICD-10-CM

## 2011-12-17 DIAGNOSIS — C50919 Malignant neoplasm of unspecified site of unspecified female breast: Secondary | ICD-10-CM

## 2011-12-17 LAB — CBC WITH DIFFERENTIAL/PLATELET
Basophils Absolute: 0 10*3/uL (ref 0.0–0.1)
EOS%: 2.4 % (ref 0.0–7.0)
Eosinophils Absolute: 0.1 10*3/uL (ref 0.0–0.5)
HGB: 13.2 g/dL (ref 11.6–15.9)
LYMPH%: 18.6 % (ref 14.0–49.7)
MCH: 27.3 pg (ref 25.1–34.0)
MCV: 83 fL (ref 79.5–101.0)
MONO%: 6.2 % (ref 0.0–14.0)
NEUT#: 3.6 10*3/uL (ref 1.5–6.5)
Platelets: 190 10*3/uL (ref 145–400)
RBC: 4.83 10*6/uL (ref 3.70–5.45)
RDW: 14.4 % (ref 11.2–14.5)

## 2011-12-17 LAB — POCT INR: INR: 2.5

## 2011-12-17 LAB — PROTIME-INR
INR: 2.5 (ref 2.00–3.50)
Protime: 30 Seconds — ABNORMAL HIGH (ref 10.6–13.4)

## 2011-12-17 MED ORDER — ONDANSETRON HCL 8 MG PO TABS: 8.0000 mg | ORAL_TABLET | Freq: Two times a day (BID) | ORAL | Status: AC | PRN

## 2011-12-17 NOTE — Progress Notes (Signed)
Pt currently taking Cipro so has been taking Coumadin 5mg  M/Th and 2.5mg  other days as instructed.  Last day of Cipro is tomorrow, so will slightly change Coumadin dose to 2.5mg  MWF and 5mg  other days since pt will be done taking interacting medicaiton.  Will check PT/INR next week to ensure a therapeutic INR.

## 2011-12-17 NOTE — Telephone Encounter (Signed)
Pt came in for coumadin clinic and per lab draw states she is having nausea relating to current use of ABX.  Prescription for Zofran sent to pharmacy.

## 2011-12-18 ENCOUNTER — Telehealth: Payer: Self-pay | Admitting: *Deleted

## 2011-12-18 NOTE — Telephone Encounter (Signed)
per orders from 12-18-2011 draw labs from [facility] in [place] on 12-25-2011 per the coumadin clinic apppointment

## 2011-12-24 ENCOUNTER — Telehealth (INDEPENDENT_AMBULATORY_CARE_PROVIDER_SITE_OTHER): Payer: Self-pay | Admitting: General Surgery

## 2011-12-24 NOTE — Telephone Encounter (Signed)
Patient called status post mastectomy on 11/10/11. She has a follow up on 12/31/2011, but is calling to see if okay to return to work next Monday on 12/28/2011. 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 She does not need a note, just an okay. Please call [phone]. Thanks.

## 2011-12-25 ENCOUNTER — Ambulatory Visit (HOSPITAL_BASED_OUTPATIENT_CLINIC_OR_DEPARTMENT_OTHER): Payer: BC Managed Care – PPO | Admitting: Pharmacist

## 2011-12-25 ENCOUNTER — Other Ambulatory Visit (HOSPITAL_BASED_OUTPATIENT_CLINIC_OR_DEPARTMENT_OTHER): Payer: BC Managed Care – PPO | Admitting: Lab

## 2011-12-25 DIAGNOSIS — I2699 Other pulmonary embolism without acute cor pulmonale: Secondary | ICD-10-CM

## 2011-12-25 DIAGNOSIS — C50919 Malignant neoplasm of unspecified site of unspecified female breast: Secondary | ICD-10-CM

## 2011-12-25 DIAGNOSIS — C50419 Malignant neoplasm of upper-outer quadrant of unspecified female breast: Secondary | ICD-10-CM

## 2011-12-25 LAB — POCT INR: INR: 1.7

## 2011-12-25 LAB — CBC WITH DIFFERENTIAL/PLATELET
BASO%: 0.2 % (ref 0.0–2.0)
EOS%: 2.4 % (ref 0.0–7.0)
MCH: 27.4 pg (ref 25.1–34.0)
MCHC: 32.9 g/dL (ref 31.5–36.0)
MONO#: 0.3 10*3/uL (ref 0.1–0.9)
NEUT%: 72.1 % (ref 38.4–76.8)
RBC: 4.78 10*6/uL (ref 3.70–5.45)
RDW: 14.6 % — ABNORMAL HIGH (ref 11.2–14.5)
WBC: 5.9 10*3/uL (ref 3.9–10.3)
lymph#: 1.2 10*3/uL (ref 0.9–3.3)
nRBC: 0 % (ref 0–0)

## 2011-12-25 LAB — PROTIME-INR

## 2011-12-25 NOTE — Progress Notes (Signed)
INR = 1.7 on 5 mg/day; 2.5 mg MWF Pt missed 1 day of Coumadin this week. She is off Cipro now. We discussed Calcium supplementation during our med review.  She stated she purchased a Calcium/Vit D supplement but it wasn't exactly what Dr. Darnelle Catalan had told her to get so she hasn't started taking it yet.  She takes TUMS PRN (sometimes TID) for indigestion.  Our pharmacy student, [place], tried to find a Microbiologist of the content of Calcium/Vit D that pt reports she was told to take but was not successful.  Rather, we told pt to go ahead & take 1 of the Calcium 600 mg w/ Vit D tablets/day given the number of TUMS she takes daily.  She is aware of the risk of constipation. Regarding her Coumadin, I will increase her dose to 5 mg/day; 2.5 mg M/F since this was her dose before going on Cipro. Recheck INR on 01/04/12 Marily Lente, Pharm.D.

## 2011-12-26 LAB — COMPREHENSIVE METABOLIC PANEL
ALT: 30 U/L (ref 0–35)
Albumin: 4.3 g/dL (ref 3.5–5.2)
CO2: 21 mEq/L (ref 19–32)
Calcium: 9.7 mg/dL (ref 8.4–10.5)
Chloride: 106 mEq/L (ref 96–112)
Glucose, Bld: 81 mg/dL (ref 70–99)
Sodium: 142 mEq/L (ref 135–145)
Total Protein: 6.7 g/dL (ref 6.0–8.3)

## 2011-12-26 LAB — CANCER ANTIGEN 27.29: CA 27.29: 26 U/mL (ref 0–39)

## 2011-12-31 ENCOUNTER — Ambulatory Visit (INDEPENDENT_AMBULATORY_CARE_PROVIDER_SITE_OTHER): Payer: BC Managed Care – PPO | Admitting: Surgery

## 2011-12-31 ENCOUNTER — Encounter (INDEPENDENT_AMBULATORY_CARE_PROVIDER_SITE_OTHER): Payer: Self-pay | Admitting: Surgery

## 2011-12-31 ENCOUNTER — Encounter (INDEPENDENT_AMBULATORY_CARE_PROVIDER_SITE_OTHER): Payer: BC Managed Care – PPO | Admitting: Surgery

## 2011-12-31 VITALS — BP 120/80 | HR 68 | Temp 98.6°F | Resp 14 | Ht 64.0 in | Wt 157.6 lb

## 2011-12-31 DIAGNOSIS — Z09 Encounter for follow-up examination after completed treatment for conditions other than malignant neoplasm: Secondary | ICD-10-CM

## 2011-12-31 NOTE — Progress Notes (Signed)
Subjective:     Patient ID: Brandi Bates, female   DOB: Jun 20, 1956, 56 y.o.   MRN: [id number]  HPI  She is doing much better now. She has some is no pain and is not requiring pain medication. Her urinary symptoms is now almost resolved as well Review of Systems     Objective:   Physical Exam On exam, she looks great. Her mastectomy incision is well-healed. Her other nonhealing wound on the left chest is now completely healed as well    Assessment:     Patient doing well status post mastectomy.  Postop course consultation by pulmonary embolism now on Coumadin    Plan:     Again, I am pleased that the left chest wound healed with the A-cell applications.  It is quite disconcerting the insurance would not pay for this. We will continue to look into this.  I will see her back in one month

## 2012-01-04 ENCOUNTER — Other Ambulatory Visit (HOSPITAL_BASED_OUTPATIENT_CLINIC_OR_DEPARTMENT_OTHER): Payer: BC Managed Care – PPO | Admitting: Lab

## 2012-01-04 ENCOUNTER — Ambulatory Visit (HOSPITAL_BASED_OUTPATIENT_CLINIC_OR_DEPARTMENT_OTHER): Payer: BC Managed Care – PPO | Admitting: Pharmacist

## 2012-01-04 DIAGNOSIS — I2699 Other pulmonary embolism without acute cor pulmonale: Secondary | ICD-10-CM

## 2012-01-04 DIAGNOSIS — Z5181 Encounter for therapeutic drug level monitoring: Secondary | ICD-10-CM

## 2012-01-04 DIAGNOSIS — Z7901 Long term (current) use of anticoagulants: Secondary | ICD-10-CM

## 2012-01-04 LAB — CBC WITH DIFFERENTIAL/PLATELET
Basophils Absolute: 0 10*3/uL (ref 0.0–0.1)
EOS%: 2 % (ref 0.0–7.0)
Eosinophils Absolute: 0.1 10*3/uL (ref 0.0–0.5)
LYMPH%: 19.1 % (ref 14.0–49.7)
MCH: 26.9 pg (ref 25.1–34.0)
MCV: 82.8 fL (ref 79.5–101.0)
MONO%: 6.1 % (ref 0.0–14.0)
NEUT#: 3.9 10*3/uL (ref 1.5–6.5)
Platelets: 195 10*3/uL (ref 145–400)
RBC: 4.58 10*6/uL (ref 3.70–5.45)
nRBC: 0 % (ref 0–0)

## 2012-01-04 LAB — PROTIME-INR: Protime: 32.4 Seconds — ABNORMAL HIGH (ref 10.6–13.4)

## 2012-01-04 NOTE — Progress Notes (Signed)
INR = 2.7 on 2.5 mg M/F; 5 mg other days Doing well.  No changes in meds, etc. INR at goal so will keep her on same Coumadin dose. Return in 3 weeks. Marily Lente, Pharm.D.

## 2012-01-06 ENCOUNTER — Encounter (HOSPITAL_COMMUNITY): Admission: RE | Payer: Self-pay | Source: Ambulatory Visit

## 2012-01-06 ENCOUNTER — Ambulatory Visit (HOSPITAL_COMMUNITY)
Admission: RE | Admit: 2012-01-06 | Payer: BC Managed Care – PPO | Source: Ambulatory Visit | Admitting: Obstetrics and Gynecology

## 2012-01-06 HISTORY — DX: Nausea with vomiting, unspecified: R11.2

## 2012-01-06 HISTORY — DX: Myoneural disorder, unspecified: G70.9

## 2012-01-06 HISTORY — DX: Nausea with vomiting, unspecified: Z98.890

## 2012-01-06 SURGERY — ROBOTIC ASSISTED BILATERAL SALPINGO OOPHORECTOMY
Anesthesia: Choice | Laterality: Bilateral

## 2012-01-11 ENCOUNTER — Encounter: Payer: Self-pay | Admitting: *Deleted

## 2012-01-11 ENCOUNTER — Telehealth: Payer: Self-pay | Admitting: *Deleted

## 2012-01-11 NOTE — Telephone Encounter (Signed)
PT REPORTS WORSENING TINGLING IN HANDS AND FEET SINCE STARTING LETROZOLE. THIS DESK NURSE ADVISED PT TO STOP THE LETROZOLE AND SENT AN ONC TX MESSAGE TO SCHEDULING TO R/S AN APPT WITH THE MD

## 2012-01-27 ENCOUNTER — Ambulatory Visit (HOSPITAL_BASED_OUTPATIENT_CLINIC_OR_DEPARTMENT_OTHER): Payer: BC Managed Care – PPO | Admitting: Pharmacist

## 2012-01-27 ENCOUNTER — Encounter (INDEPENDENT_AMBULATORY_CARE_PROVIDER_SITE_OTHER): Payer: Self-pay | Admitting: Surgery

## 2012-01-27 ENCOUNTER — Other Ambulatory Visit (HOSPITAL_BASED_OUTPATIENT_CLINIC_OR_DEPARTMENT_OTHER): Payer: BC Managed Care – PPO

## 2012-01-27 ENCOUNTER — Encounter: Payer: Self-pay | Admitting: Oncology

## 2012-01-27 ENCOUNTER — Ambulatory Visit (INDEPENDENT_AMBULATORY_CARE_PROVIDER_SITE_OTHER): Payer: BC Managed Care – PPO | Admitting: Surgery

## 2012-01-27 VITALS — BP 123/82 | HR 82 | Temp 97.3°F | Resp 16 | Ht 64.0 in | Wt 164.6 lb

## 2012-01-27 DIAGNOSIS — I2699 Other pulmonary embolism without acute cor pulmonale: Secondary | ICD-10-CM

## 2012-01-27 DIAGNOSIS — Z09 Encounter for follow-up examination after completed treatment for conditions other than malignant neoplasm: Secondary | ICD-10-CM

## 2012-01-27 DIAGNOSIS — Z7901 Long term (current) use of anticoagulants: Secondary | ICD-10-CM

## 2012-01-27 LAB — CBC WITH DIFFERENTIAL/PLATELET
Basophils Absolute: 0 10*3/uL (ref 0.0–0.1)
HCT: 38.3 % (ref 34.8–46.6)
HGB: 12.5 g/dL (ref 11.6–15.9)
MONO#: 0.3 10*3/uL (ref 0.1–0.9)
NEUT%: 69.2 % (ref 38.4–76.8)
WBC: 4.7 10*3/uL (ref 3.9–10.3)
lymph#: 1.1 10*3/uL (ref 0.9–3.3)

## 2012-01-27 LAB — PROTIME-INR
INR: 2.1 (ref 2.00–3.50)
Protime: 25.2 Seconds — ABNORMAL HIGH (ref 10.6–13.4)

## 2012-01-27 NOTE — Progress Notes (Signed)
INR = 2.1 on 5 mg/day; 2.5 mg M/F Pt missed 1 dose of Coumadin last week. Has a bruise on R upper arm. Minor/small. 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  Pt c/o wooshing/whistle sound in L ear certain ways she moves her head.  No associated HA/pain; just a noise.  No "earache."  I advised her to f/u w/ her PCP about this.  Pt stated she may have wax build-up. INR therapeutic so will cont current dose. Return in 1 month. Marily Lente, Pharm.D.

## 2012-01-27 NOTE — Progress Notes (Signed)
Subjective:     Patient ID: Brandi Bates, female   DOB: 02/26/1956, 56 y.o.   MRN: [id number]  HPI  She is here for another visit. She looks fantastic and is doing well. She has minimal discomfort. She continues on Coumadin Review of Systems     Objective:   Physical Exam Her right mastectomy site is healing well. Her left side is also completely healed including the site where a cell was applied    Assessment:     Postop visit status post right mastectomy for breast cancer with history of left breast cancer and mastectomy    Plan:     I will see her back in 3 months

## 2012-02-01 ENCOUNTER — Telehealth: Payer: Self-pay | Admitting: Oncology

## 2012-02-01 NOTE — Telephone Encounter (Signed)
Sent letter to PT

## 2012-02-02 ENCOUNTER — Other Ambulatory Visit: Payer: Self-pay | Admitting: *Deleted

## 2012-02-10 NOTE — Addendum Note (Signed)
Encounter addended by: Lowella Petties, RN on: 02/10/2012 11:42 AM<BR>     Documentation filed: Charges VN

## 2012-02-16 ENCOUNTER — Ambulatory Visit (HOSPITAL_BASED_OUTPATIENT_CLINIC_OR_DEPARTMENT_OTHER): Payer: BC Managed Care – PPO | Admitting: Oncology

## 2012-02-16 ENCOUNTER — Telehealth: Payer: Self-pay | Admitting: Oncology

## 2012-02-16 VITALS — BP 135/80 | HR 81 | Temp 98.8°F | Ht 64.0 in | Wt 169.3 lb

## 2012-02-16 DIAGNOSIS — C50911 Malignant neoplasm of unspecified site of right female breast: Secondary | ICD-10-CM

## 2012-02-16 DIAGNOSIS — C50919 Malignant neoplasm of unspecified site of unspecified female breast: Secondary | ICD-10-CM

## 2012-02-16 NOTE — Progress Notes (Signed)
ID: Brandi Bates   DOB: Dec 27, 1955  MR#: [id number]  HQI#:[id number]  HISTORY OF PRESENT ILLNESS: The patient felt a mass in her left axilla several months ago.  She did not bring this to her physician's attention because she wasn't sure it really meant anything, but she asked her daughter, and her daughter asked a nurse fried, and they told her she better get on and have this evaluated, so she did bring it to [name] attention and he set her up for diagnostic mammography and ultrasonography performed at [facility] on September 22, 2010.  Dr. [name] was able to demonstrate a spiculated density at the 12 o'clock position of the breast with pleomorphic calcifications measuring about 4 cm.  There was a 2nd focal rounded area of increased density and with the axilla, there was an ovoid mass measuring up to 3.8 cm. Ultrasound showed the area of heterogeneous decreased echogenicity as well as the 2nd discrete focus.  The right breast showed only a cystic mass measuring 1 cm which was of no consequence.  Biopsy was performed of the left breast mass only on the same day.  The pathology report ([id number]) biopsied 2 masses in the left breast.  The second mass was "located more superiorly" and was also identified and biopsied.  The pathology report labels both masses "at 12 o'clock" so we will have to clarify this issue.  At any rate, both masses were Grade 3 invasive ductal carcinoma.  The first one was estrogen receptor positive at 61% but progesterone receptor negative with a proliferation marker of 95%.  The second one was ER positive at 98% and PR "positive" at 3%, also with a proliferation marker of 95%.  Both showed no HER2 amplification.  With this information, the patient was referred for breast MRI. This was performed September 30, 2010 and showed the main breast mass to measure 3.8 cm.  The posterior mass measured 6 millimeters. There were some confluent axillary masses to a maximum measurement of 6.8 cm. The  patient underwent definitive surgery 10/23/2011 with results and subsequent treatment as detailed below   INTERVAL HISTORY: Senta returns today for followup of her breast cancer. Since the last visit here, she developed some numbness in her fingertips and her feet, which she thought might be related to anastrozole. We suggested she stop the medication, which she did about 4 weeks ago. She feels that those numbness and tingling symptoms are perhaps a little better.  REVIEW OF SYSTEMS: She is having "bad" hot flashes, 6 or 7 times a day, and 2-3 times a night. She is not having problems with vaginal dryness, and there have been no arthralgias or myalgias associated with the anastrozole. She is having some ankle swelling and she feels forgetful. She did go to the beach last weekend and did a lot of walking there but otherwise she is not exercising. A detailed review of systems is otherwise negative and from a coagulation point of view she is tolerating the warfarin with no bleeding or other side effects.  Past Medical History  Diagnosis Date  . Numbness of feet   . GERD (gastroesophageal reflux disease)   . Chronic low back pain     "everyday"  . Eczema   . Lymphedema of arm     left  . History of radiation therapy 04/23/11 thru 06/08/11    L breast  . Breast wound   . PONV (postoperative nausea and vomiting)   . Migraines   . Neuromuscular disorder  raynauds syndrome   . Shortness of breath on exertion   . Anxiety   . Breast cancer 10/23/10    s/p L mastectomy, chemo/radiation, er/pr +, Her2 -  . Breast cancer 11/10/11    S/P right mastectomy  . Allergy   . Hepatic steatosis 11/20/11    severe   . Bladder cystocele 11/20/11    low-lying ct result  . Pulmonary thromboembolism 11/20/11    ct positive acute w/i segmental branches of right lower lobe  Significant for history of migraines, history of GERD, history of chronic constipation, history of chronic low back pain, history of tobacco  abuse, the patient quitting in 1998 with approximately a 50 pack-year history prior to that.  The patient is status post tubal ligation, status post simple hysterectomy without salpingo-oophorectomy and status post cholecystectomy.  PAST SURGICAL HISTORY: Past Surgical History  Procedure Date  . Abdominal hysterectomy   . Gallbladder surgery   . Breast surgery   . Irrigation and debridement abscess 06/16/2011    Procedure: IRRIGATION AND DEBRIDEMENT ABSCESS;  Surgeon: Harl Bowie, MD;  Location: [facility];  Service: General;  Laterality: Left;  incision and drainage of left chest wall abcess  . Bilateral salpingoophorectomy 11/10/11    laparoscopy  . Diagnostic laparoscopy   . Mastectomy 11/10/11    right; w/SNB  . Mastectomy modified radical 10/23/11    left  . Cholecystectomy 1999  . Tubal ligation 1979  . Mastectomy w/ sentinel node biopsy 11/10/2011    Procedure: MASTECTOMY WITH SENTINEL LYMPH NODE BIOPSY;  Surgeon: Harl Bowie, MD;  Location: [facility];  Service: General;  Laterality: Right;  . Portacath placement     right subclavian    FAMILY HISTORY Family History  Problem Relation Age of Onset  . Cancer Mother     lung  . Hypertension Mother   . Cancer Father     lung  . Cancer Brother     BRAIN CANCER  . Hypertension Brother   . Cancer Cousin      2 PATERNAL COUSINS - BREAST CA  . Hypertension Sister   . Hypertension Maternal Uncle   . Hypertension Maternal Grandmother   The patient's father died at the age of 73 and the patient's mother at the age of 22.  Both were smokers and both had lung cancer. The patient had one brother who died at age 34 with glioblastoma multiforme.  She has one surviving brother, one surviving sister who is present today, and a half-brother.  There are two cousins, both on the father's side, who had breast cancer in their 38s.  There is one uncle with Leverne Humbles disease.  GYNECOLOGIC HISTORY: She is GX P1.  First pregnancy to term  at age 69.  She never took hormone replacement.    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  Her husband, [name], is a Geologist, engineering for an Associate Professor.  Daughter Leveda Anna works at the same office as her mother.  Son Louie Casa works for [employer].  The patient has 7 grandchildren. She attends a [employer].     ADVANCED DIRECTIVES: in place  HEALTH MAINTENANCE: History  Substance Use Topics  . Smoking status: Former Smoker -- 2.0 packs/day for 28 years    Types: Cigarettes    Quit date: 05/31/1997  . Smokeless tobacco: Never Used  . Alcohol Use:  No     Colonoscopy:  PAP: s/p hysterectomy  Bone density: SOLIS October 2012, T - 1.4 at [facility]  Lipid panel:  Allergies  Allergen Reactions  . Morphine And Related Hives and Itching    All over the body  . Omeprazole Magnesium Nausea Only  . Gabapentin Other (See Comments)    Unable to sleep  . Latex Rash    Only where touched    Current Outpatient Prescriptions  Medication Sig Dispense Refill  . calcium carbonate (TUMS - DOSED IN MG ELEMENTAL CALCIUM) 500 MG chewable tablet Chew 1 tablet by mouth as needed.      . clonazePAM (KLONOPIN) 0.5 MG tablet Take 0.5 mg by mouth at bedtime as needed. For anxiety      . LYSINE PO Take 1 tablet by mouth daily.       . ondansetron (ZOFRAN) 8 MG tablet Take 8 mg by mouth Every 12 hours as needed. For n/v      . polyethylene glycol (MIRALAX / GLYCOLAX) packet Take 17 g by mouth daily as needed. For constipation      . warfarin (COUMADIN) 5 MG tablet Take 2.5-5 mg by mouth daily. Take 1 tab daily except 0.5 tab daily on Mondays & Thursdays.      Marland Kitchen DISCONTD: gabapentin (NEURONTIN) 300 MG capsule daily.        OBJECTIVE:  Filed Vitals:   02/16/12 0933  BP: 135/80  Pulse: 81  Temp: 98.8 F (37.1 C)     Body mass index is 29.06 kg/(m^2).    ECOG FS: 2  Middle-aged white woman who appears  well Sclerae unicteric Oropharynx clear No peripheral adenopathy, and specifically there is no axillary adenopathy bilaterally Lungs no rales or rhonchi Heart regular rate and rhythm Abd benign MSK no focal spinal tenderness, no peripheral edema Neuro: nonfocal Breasts: Status post bilateral mastectomies. There is no evidence of local recurrence. Skin is tight over the left scar area  LAB RESULTS: Lab Results  Component Value Date   WBC 4.7 01/27/2012   NEUTROABS 3.3 01/27/2012   HGB 12.5 01/27/2012   HCT 38.3 01/27/2012   MCV 84.0 01/27/2012   PLT 185 01/27/2012      Chemistry      Component Value Date/Time   NA 142 12/25/2011 1005   K 3.9 12/25/2011 1005   CL 106 12/25/2011 1005   CO2 21 12/25/2011 1005   BUN 11 12/25/2011 1005   CREATININE 0.65 12/25/2011 1005      Component Value Date/Time   CALCIUM 9.7 12/25/2011 1005   ALKPHOS 65 12/25/2011 1005   AST 20 12/25/2011 1005   ALT 30 12/25/2011 1005   BILITOT 0.3 12/25/2011 1005       Lab Results  Component Value Date   LABCA2 26 12/25/2011    No results found for this basename: INR:1;PROTIME:1 in the last 168 hours  No results found for this basename: UACOL:1,UAPR:1,USPG:1,UPH:1,UTP:1,UGL:1,UKET:1,UBIL:1,UHGB:1,UNIT:1,UROB:1,ULEU:1,UEPI:1,UWBC:1,URBC:1,UBAC:1,CAST:1,CRYS:1,UCOM:1,BILUA:1 in the last 72 hours   STUDIES: No new results found.  ASSESSMENT:55 y.o.  [place] woman with a BRCA mutation of unknown clinical significance  (1) status post left modified radical mastectomy March of 2012 for a T3 N3a (stage IIIC)  invasive ductal carcinoma, grade 3,  which was strongly estrogen receptor positive, progesterone receptor and HER2 negative, with an MIB-1 of 95%.   (2) s/p 4 cycles of adjuvant dose dense doxorubicin and cyclophosphamide, and 11 doses of weekly paclitaxel,   (3) s/p radiation to the left chest and regional  nodes completed November 2012, at which time she started tamoxifen  (4) s/p right mastectomy with  sentinel lymph node sampling 11/10/2011 for a pT1b pN0, stage IA invasive ductal carcinoma, grade 1, with insufficient tumor for estrogen and progesterone testing, but HER-2 negative.  (5) anastrozole started May 2013, interrupted June 2013 as discussed above  (6) post-op pulmonary embolus documented 11/20/2011  PLAN: We reviewed the benefits she is likely to receive from anti-estrogens. Given the pulmonary embolus while on tamoxifen, I would be against going back to that medicine, so we need to use an aromatase inhibitor. Her choices are to give anastrozole a second chance, or to try letrozole or exemestane.  After much discussion, particularly since I feel the tingling and numbness symptoms she was having are more likely to be residual from chemotherapy then due to the anastrozole, we are going to go back to the anastrozole. She is going to give it to 2 months and see she can tolerate it. She will see me in September. If she cannot tolerate the anastrozole, we will switch to exemestane at that point.   She will continue to be followed through the Coumadin clinic here and she had already has appointments August 5 and September 2 regarding her INR. She knows to call for any problems that may develop before the next visit.    MAGRINAT,GUSTAV C    02/16/2012

## 2012-02-16 NOTE — Telephone Encounter (Signed)
gve the pt her sept 2013 appt calendar °

## 2012-02-29 ENCOUNTER — Other Ambulatory Visit (HOSPITAL_BASED_OUTPATIENT_CLINIC_OR_DEPARTMENT_OTHER): Payer: BC Managed Care – PPO | Admitting: Lab

## 2012-02-29 ENCOUNTER — Ambulatory Visit (HOSPITAL_BASED_OUTPATIENT_CLINIC_OR_DEPARTMENT_OTHER): Payer: BC Managed Care – PPO | Admitting: Pharmacist

## 2012-02-29 DIAGNOSIS — I2699 Other pulmonary embolism without acute cor pulmonale: Secondary | ICD-10-CM

## 2012-02-29 LAB — PROTIME-INR: Protime: 31.2 Seconds — ABNORMAL HIGH (ref 10.6–13.4)

## 2012-02-29 NOTE — Progress Notes (Signed)
Continue Coumadin 2.5mg  on Mon and Fri and 5mg  other days. Check PT/INR on 03/29/12 at 11:30 am for lab; 11:45 am for coumadin clinic. Pt scheduled to see Dr. Darnelle Catalan the same day.

## 2012-02-29 NOTE — Patient Instructions (Addendum)
Continue Coumadin 2.5mg  on Mon and Fri and 5mg  other days. Check PT/INR on 03/29/12 at 11:30 am for lab; 11:45 am for coumadin clinic. You are scheduled to see Dr. Darnelle Catalan the same day @ 12:00.

## 2012-03-01 ENCOUNTER — Encounter: Payer: Self-pay | Admitting: *Deleted

## 2012-03-29 ENCOUNTER — Telehealth: Payer: Self-pay | Admitting: *Deleted

## 2012-03-29 ENCOUNTER — Ambulatory Visit (HOSPITAL_BASED_OUTPATIENT_CLINIC_OR_DEPARTMENT_OTHER): Payer: BC Managed Care – PPO | Admitting: Oncology

## 2012-03-29 ENCOUNTER — Other Ambulatory Visit (HOSPITAL_BASED_OUTPATIENT_CLINIC_OR_DEPARTMENT_OTHER): Payer: BC Managed Care – PPO

## 2012-03-29 ENCOUNTER — Ambulatory Visit: Payer: BC Managed Care – PPO | Admitting: Pharmacist

## 2012-03-29 VITALS — BP 131/79 | HR 69 | Temp 98.4°F | Resp 20 | Ht 64.0 in | Wt 166.7 lb

## 2012-03-29 DIAGNOSIS — Z170 Estrogen receptor positive status [ER+]: Secondary | ICD-10-CM

## 2012-03-29 DIAGNOSIS — C50919 Malignant neoplasm of unspecified site of unspecified female breast: Secondary | ICD-10-CM

## 2012-03-29 DIAGNOSIS — I2699 Other pulmonary embolism without acute cor pulmonale: Secondary | ICD-10-CM

## 2012-03-29 DIAGNOSIS — R52 Pain, unspecified: Secondary | ICD-10-CM

## 2012-03-29 DIAGNOSIS — C50911 Malignant neoplasm of unspecified site of right female breast: Secondary | ICD-10-CM

## 2012-03-29 LAB — COMPREHENSIVE METABOLIC PANEL (CC13)
ALT: 28 U/L (ref 0–55)
Albumin: 4.1 g/dL (ref 3.5–5.0)
CO2: 25 mEq/L (ref 22–29)
Calcium: 10 mg/dL (ref 8.4–10.4)
Chloride: 108 mEq/L — ABNORMAL HIGH (ref 98–107)
Sodium: 144 mEq/L (ref 136–145)
Total Protein: 7 g/dL (ref 6.4–8.3)

## 2012-03-29 LAB — CBC & DIFF AND RETIC
BASO%: 0.2 % (ref 0.0–2.0)
EOS%: 1.5 % (ref 0.0–7.0)
HCT: 39 % (ref 34.8–46.6)
Immature Retic Fract: 6.1 % (ref 1.60–10.00)
LYMPH%: 21.4 % (ref 14.0–49.7)
MCH: 28.2 pg (ref 25.1–34.0)
MCHC: 33.3 g/dL (ref 31.5–36.0)
MONO#: 0.3 10*3/uL (ref 0.1–0.9)
NEUT%: 71.4 % (ref 38.4–76.8)
Platelets: 186 10*3/uL (ref 145–400)

## 2012-03-29 LAB — PROTIME-INR
INR: 2.3 (ref 2.00–3.50)
Protime: 27.6 Seconds — ABNORMAL HIGH (ref 10.6–13.4)

## 2012-03-29 LAB — POCT INR: INR: 2.3

## 2012-03-29 MED ORDER — TRAMADOL HCL 50 MG PO TABS: 50.0000 mg | ORAL_TABLET | Freq: Three times a day (TID) | ORAL | Status: AC | PRN

## 2012-03-29 MED ORDER — LORAZEPAM 0.5 MG PO TABS: 0.5000 mg | ORAL_TABLET | Freq: Three times a day (TID) | ORAL | Status: DC

## 2012-03-29 NOTE — Telephone Encounter (Signed)
gave patient appointment for three months with dr. Linton Rump berry per orders from 03-29-2012

## 2012-03-29 NOTE — Patient Instructions (Signed)
No changes to report at this time.  She is taking 5mg  daily with 2.5 mg on MF.  She is therapeutic today at 2.3.  We will continue this same dose. We will see her back in approx 4 weeks on 10/1 at 1pm

## 2012-03-29 NOTE — Progress Notes (Signed)
No changes to report at this time.  She is taking 5mg daily with 2.5 mg on MF.  She is therapeutic today at 2.3.  We will continue this same dose. We will see her back in approx 4 weeks on 10/1 at 1pm  

## 2012-03-29 NOTE — Progress Notes (Signed)
ID: Brandi Bates   DOB: Nov 12, 1955  MR#: [id number]  MCN#:[id number]  HISTORY OF PRESENT ILLNESS: The patient felt a mass in her left axilla several months ago.  She did not bring this to her physician's attention because she wasn't sure it really meant anything, but she asked her daughter, and her daughter asked a nurse fried, and they told her she better get on and have this evaluated, so she did bring it to [name] attention and he set her up for diagnostic mammography and ultrasonography performed at [facility] on September 22, 2010.  Dr. [name] was able to demonstrate a spiculated density at the 12 o'clock position of the breast with pleomorphic calcifications measuring about 4 cm.  There was a 2nd focal rounded area of increased density and with the axilla, there was an ovoid mass measuring up to 3.8 cm. Ultrasound showed the area of heterogeneous decreased echogenicity as well as the 2nd discrete focus.  The right breast showed only a cystic mass measuring 1 cm which was of no consequence.  Biopsy was performed of the left breast mass only on the same day.  The pathology report ([id number]) biopsied 2 masses in the left breast.  The second mass was "located more superiorly" and was also identified and biopsied.  The pathology report labels both masses "at 12 o'clock" so we will have to clarify this issue.  At any rate, both masses were Grade 3 invasive ductal carcinoma.  The first one was estrogen receptor positive at 61% but progesterone receptor negative with a proliferation marker of 95%.  The second one was ER positive at 98% and PR "positive" at 3%, also with a proliferation marker of 95%.  Both showed no HER2 amplification.  With this information, the patient was referred for breast MRI. This was performed September 30, 2010 and showed the main breast mass to measure 3.8 cm.  The posterior mass measured 6 millimeters. There were some confluent axillary masses to a maximum measurement of 6.8 cm. The  patient underwent definitive surgery 10/23/2011 with results and subsequent treatment as detailed below   INTERVAL HISTORY: Brandi Bates returns today for followup of her breast cancer. Since the last visit here, she went back on anastrozole. There were some family strains or the holidays, she tells me, but she continues to work full-time and mostly "everything is okay".  REVIEW OF SYSTEMS:  She is not sleeping particularly well. She goes to sleep easily but something wakes her up. It is not hot flashes. Recall she did not tolerate gabapentin. She is having hot flashes, which are more of a daytime problem. She also has some muscle aches. After she sits for a long time is just painful to get up and start moving. When she gets moving she feels fine. Occasionally she takes ibuprofen for this. Sometimes she takes [place] powder. She was advised to stay away from nonsteroidals and aspirin-like drugs and this is discussed further below. She has some ringing in her left ear. Occasionally her feet swell. 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 Occasionally she has strange feeling certainly feelings in the third fourth and fifth digits of her left hand. Not anywhere else. A detailed review of systems was otherwise negative.  Past Medical History  Diagnosis Date  . Numbness of feet   . GERD (gastroesophageal reflux disease)   . Chronic low back pain     "everyday"  . Eczema   . Lymphedema of arm  left  . History of radiation therapy 04/23/11 thru 06/08/11    L breast  . Breast wound   . PONV (postoperative nausea and vomiting)   . Migraines   . Neuromuscular disorder     raynauds syndrome   . Shortness of breath on exertion   . Anxiety   . Breast cancer 10/23/10    s/p L mastectomy, chemo/radiation, er/pr +, Her2 -  . Breast cancer 11/10/11    S/P right mastectomy  . Allergy   . Hepatic steatosis 11/20/11    severe   . Bladder cystocele 11/20/11    low-lying ct  result  . Pulmonary thromboembolism 11/20/11    ct positive acute w/i segmental branches of right lower lobe  Significant for history of migraines, history of GERD, history of chronic constipation, history of chronic low back pain, history of tobacco abuse, the patient quitting in 1998 with approximately a 50 pack-year history prior to that.  The patient is status post tubal ligation, status post simple hysterectomy without salpingo-oophorectomy and status post cholecystectomy.  PAST SURGICAL HISTORY: Past Surgical History  Procedure Date  . Abdominal hysterectomy   . Gallbladder surgery   . Breast surgery   . Irrigation and debridement abscess 06/16/2011    Procedure: IRRIGATION AND DEBRIDEMENT ABSCESS;  Surgeon: Shelly Rubenstein, MD;  Location: [facility];  Service: General;  Laterality: Left;  incision and drainage of left chest wall abcess  . Bilateral salpingoophorectomy 11/10/11    laparoscopy  . Diagnostic laparoscopy   . Mastectomy 11/10/11    right; w/SNB  . Mastectomy modified radical 10/23/11    left  . Cholecystectomy 1999  . Tubal ligation 1979  . Mastectomy w/ sentinel node biopsy 11/10/2011    Procedure: MASTECTOMY WITH SENTINEL LYMPH NODE BIOPSY;  Surgeon: Shelly Rubenstein, MD;  Location: [facility] OR;  Service: General;  Laterality: Right;  . Portacath placement     right subclavian    FAMILY HISTORY Family History  Problem Relation Age of Onset  . Cancer Mother     lung  . Hypertension Mother   . Cancer Father     lung  . Cancer Brother     BRAIN CANCER  . Hypertension Brother   . Cancer Cousin      2 PATERNAL COUSINS - BREAST CA  . Hypertension Sister   . Hypertension Maternal Uncle   . Hypertension Maternal Grandmother   The patient's father died at the age of 57 and the patient's mother at the age of 64.  Both were smokers and both had lung cancer. The patient had one brother who died at age 69 with glioblastoma multiforme.  She has one surviving brother, one  surviving sister who is present today, and a half-brother.  There are two cousins, both on the father's side, who had breast cancer in their 35s.  There is one uncle with Doreatha Martin disease.  GYNECOLOGIC HISTORY: She is GX P1.  First pregnancy to term at age 32.  She never took hormone replacement.    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  Her husband, [name], is a Therapist, music for [employer].  Daughter [name] works at the same office as her mother.  Son [name] works for [employer].  The patient has 7 grandchildren. She attends a [employer].     ADVANCED DIRECTIVES: in place  HEALTH MAINTENANCE: History  Substance Use Topics  . Smoking status: Former Smoker -- 2.0 packs/day for 28 years    Types: Cigarettes    Quit date: 05/31/1997  . Smokeless tobacco: Never Used  . Alcohol Use: No     Colonoscopy:  PAP: s/p hysterectomy  Bone density: SOLIS October 2012, T - 1.4 at [facility]  Lipid panel:  Allergies  Allergen Reactions  . Morphine And Related Hives and Itching    All over the body  . Omeprazole Magnesium Nausea Only  . Gabapentin Other (See Comments)    Unable to sleep  . Latex Rash    Only where touched    Current Outpatient Prescriptions  Medication Sig Dispense Refill  . anastrozole (ARIMIDEX) 1 MG tablet Take 1 mg by mouth daily.      . calcium carbonate (TUMS - DOSED IN MG ELEMENTAL CALCIUM) 500 MG chewable tablet Chew 1 tablet by mouth as needed.      . clonazePAM (KLONOPIN) 0.5 MG tablet Take 0.5 mg by mouth at bedtime as needed. For anxiety      . LYSINE PO Take 1 tablet by mouth daily.       . ondansetron (ZOFRAN) 8 MG tablet Take 8 mg by mouth Every 12 hours as needed. For n/v      . polyethylene glycol (MIRALAX / GLYCOLAX) packet Take 17 g by mouth daily as needed. For constipation      . warfarin (COUMADIN) 5 MG tablet Take 2.5-5 mg by mouth daily.  Take 1 tab daily except 0.5 tab daily on Mondays & Thursdays.      Marland Kitchen DISCONTD: gabapentin (NEURONTIN) 300 MG capsule daily.        OBJECTIVE:  Filed Vitals:   03/29/12 1209  BP: 131/79  Pulse: 69  Temp: 98.4 F (36.9 C)  Resp: 20     Body mass index is 28.61 kg/(m^2).    ECOG FS: 2  Middle-aged white woman in no acute distress Sclerae unicteric Oropharynx clear; the right ear is unremarkable. The left year has a significant amount of wax almost obstructing the canal. No peripheral adenopathy, and specifically there is no axillary adenopathy bilaterally Lungs no rales or rhonchi Heart regular rate and rhythm Abd benign MSK no focal spinal tenderness, no peripheral edema Neuro: nonfocal Breasts: Status post bilateral mastectomies. There is no evidence of local recurrence. Skin is tight over the left scar area  LAB RESULTS: Lab Results  Component Value Date   WBC 4.8 03/29/2012   NEUTROABS 3.4 03/29/2012   HGB 13.0 03/29/2012   HCT 39.0 03/29/2012   MCV 84.6 03/29/2012   PLT 186 03/29/2012      Chemistry      Component Value Date/Time   NA 142 12/25/2011 1005   K 3.9 12/25/2011 1005   CL 106 12/25/2011 1005   CO2 21 12/25/2011 1005   BUN 11 12/25/2011 1005   CREATININE 0.65 12/25/2011 1005      Component Value Date/Time   CALCIUM 9.7 12/25/2011 1005   ALKPHOS 65 12/25/2011 1005   AST 20 12/25/2011 1005   ALT 30 12/25/2011 1005   BILITOT 0.3 12/25/2011 1005       Lab Results  Component Value Date   LABCA2 26 12/25/2011     Lab 03/29/12 1157  INR 2.3    No results found for this basename: UACOL:1,UAPR:1,USPG:1,UPH:1,UTP:1,UGL:1,UKET:1,UBIL:1,UHGB:1,UNIT:1,UROB:1,ULEU:1,UEPI:1,UWBC:1,URBC:1,UBAC:1,CAST:1,CRYS:1,UCOM:1,BILUA:1 in the last 72 hours   STUDIES: No results found.   ASSESSMENT:55 y.o.  [place] woman with a  BRCA mutation of unknown clinical significance  (1) status post left modified radical mastectomy March of 2012 for a T3 N3a (stage IIIC)  invasive ductal  carcinoma, grade 3,  which was strongly estrogen receptor positive, progesterone receptor and HER2 negative, with an MIB-1 of 95%.   (2) s/p 4 cycles of adjuvant dose dense doxorubicin and cyclophosphamide, and 11 doses of weekly paclitaxel,   (3) s/p radiation to the left chest and regional nodes completed November 2012, at which time she started tamoxifen  (4) s/p right mastectomy with sentinel lymph node sampling 11/10/2011 for a pT1b pN0, stage IA invasive ductal carcinoma, grade 1, with insufficient tumor for estrogen and progesterone testing, but HER-2 negative.  (5) anastrozole started May 2013, interrupted June 2013, resumed July 2013  (6) post-op pulmonary embolus documented 11/20/2011  PLAN: She could be having some of the arthralgias/myalgias associated with aromatase inhibitors, but given the overall situation I think her best bed is to try to push through and learn to live with this drug. I did write her for tramadol today in case she has pain here in there, and I instructed her not to take any ibuprofen, Aleve, aspirin, Goody powders, or similar drugs. She is doing well with her anticoagulation, and after 6 months it would be comfortable with her discontinuing Coumadin. Otherwise she will see [place] again in 3 months. She knows to call for any problems that may develop before the next visit.   Shawnell Dykes C    03/29/2012

## 2012-04-15 ENCOUNTER — Other Ambulatory Visit: Payer: Self-pay | Admitting: Pharmacist

## 2012-04-15 NOTE — Telephone Encounter (Signed)
Rx called in to pt's [facility]: Coumadin 5mg  tablets 1 tab po daily except 0.5 tab on MF or as directed #30, 5 RF MD: Magrinat

## 2012-04-26 ENCOUNTER — Ambulatory Visit (HOSPITAL_BASED_OUTPATIENT_CLINIC_OR_DEPARTMENT_OTHER): Payer: BC Managed Care – PPO | Admitting: Pharmacist

## 2012-04-26 ENCOUNTER — Ambulatory Visit (INDEPENDENT_AMBULATORY_CARE_PROVIDER_SITE_OTHER): Payer: BC Managed Care – PPO | Admitting: Surgery

## 2012-04-26 ENCOUNTER — Encounter (INDEPENDENT_AMBULATORY_CARE_PROVIDER_SITE_OTHER): Payer: Self-pay | Admitting: Surgery

## 2012-04-26 ENCOUNTER — Other Ambulatory Visit (HOSPITAL_BASED_OUTPATIENT_CLINIC_OR_DEPARTMENT_OTHER): Payer: BC Managed Care – PPO | Admitting: Lab

## 2012-04-26 VITALS — BP 128/58 | HR 72 | Temp 97.1°F | Resp 16 | Ht 64.0 in | Wt 167.4 lb

## 2012-04-26 DIAGNOSIS — Z853 Personal history of malignant neoplasm of breast: Secondary | ICD-10-CM

## 2012-04-26 DIAGNOSIS — I2699 Other pulmonary embolism without acute cor pulmonale: Secondary | ICD-10-CM

## 2012-04-26 LAB — POCT INR: INR: 2.4

## 2012-04-26 NOTE — Progress Notes (Signed)
Pt therapeutic today at 2.4.  Coumadin is 5 mg daily with 2.5 mg on Mon and Fri.  Will remain on this dose.  Pt inquiring about length of therapy. Her 6 mos is end of Oct.  She would like this addressed at her next visit on 05/25/2012.

## 2012-04-26 NOTE — Progress Notes (Signed)
Subjective:     Patient ID: Brandi Bates, female   DOB: July 31, 1955, 56 y.o.   MRN: [id number]  HPI She is here for another followup of her breast cancer status post bilateral mastectomies. She continues to be followed closely the cancer center. She has no complaints today.  Review of Systems     Objective:   Physical Exam On exam, her mastectomy sites are well healed and there are no palpable masses and no adenopathy.    Assessment:     Patient stable with a history of bilateral breast cancer    Plan:     I will see her back in 6 months

## 2012-04-26 NOTE — Patient Instructions (Addendum)
Continue 5 mg daily with 2.5 mg on Mon and Fri.  RTC on 05/25/12 at 12:30

## 2012-04-27 ENCOUNTER — Ambulatory Visit: Payer: Self-pay | Admitting: Pharmacist

## 2012-04-27 ENCOUNTER — Other Ambulatory Visit: Payer: Self-pay | Admitting: Oncology

## 2012-04-27 ENCOUNTER — Telehealth: Payer: Self-pay | Admitting: *Deleted

## 2012-04-27 DIAGNOSIS — C50919 Malignant neoplasm of unspecified site of unspecified female breast: Secondary | ICD-10-CM

## 2012-04-27 NOTE — Telephone Encounter (Signed)
change labs from 12/4 to 12/2; do not change 12/4 visit as scheduled  Patient confirmed over the phone the new date and time of the lab only appointment

## 2012-04-27 NOTE — Progress Notes (Signed)
MD OK with stopping coumadin at her last appmt with Coumadin clinic end of October.

## 2012-05-25 ENCOUNTER — Other Ambulatory Visit (HOSPITAL_BASED_OUTPATIENT_CLINIC_OR_DEPARTMENT_OTHER): Payer: BC Managed Care – PPO | Admitting: Lab

## 2012-05-25 ENCOUNTER — Ambulatory Visit: Payer: BC Managed Care – PPO | Admitting: Pharmacist

## 2012-05-25 DIAGNOSIS — I2699 Other pulmonary embolism without acute cor pulmonale: Secondary | ICD-10-CM

## 2012-05-25 LAB — PROTIME-INR
INR: 2.8 (ref 2.00–3.50)
Protime: 33.6 Seconds — ABNORMAL HIGH (ref 10.6–13.4)

## 2012-05-25 NOTE — Progress Notes (Signed)
INR therapeutic today (2.8) on 5mg  daily. No problems.  Missed one dose on Saturday when she couldn't get to her medication when her phone alarm went off. No changes in diet.  Added meclizine for inner ear problems.   Pt has now completed 6 months of therapy.  OK to discontinue Coumadin.  Will archive pt in Dose Response.  Please reconsult as needed.

## 2012-06-24 ENCOUNTER — Telehealth: Payer: Self-pay | Admitting: Oncology

## 2012-06-24 ENCOUNTER — Telehealth: Payer: Self-pay | Admitting: *Deleted

## 2012-06-24 NOTE — Telephone Encounter (Signed)
Pt requested to do her labs on the same day as the ofc visit due to her work schedule.

## 2012-06-24 NOTE — Telephone Encounter (Signed)
Returned patient's voice message and informed her of the 06-27-2012 lab only appointment

## 2012-06-27 ENCOUNTER — Other Ambulatory Visit: Payer: BC Managed Care – PPO

## 2012-06-28 ENCOUNTER — Other Ambulatory Visit: Payer: Self-pay | Admitting: *Deleted

## 2012-06-29 ENCOUNTER — Ambulatory Visit: Payer: BC Managed Care – PPO | Admitting: Physician Assistant

## 2012-06-29 ENCOUNTER — Encounter: Payer: Self-pay | Admitting: Family

## 2012-06-29 ENCOUNTER — Ambulatory Visit: Payer: BC Managed Care – PPO

## 2012-06-29 ENCOUNTER — Telehealth: Payer: Self-pay | Admitting: Oncology

## 2012-06-29 ENCOUNTER — Ambulatory Visit (HOSPITAL_BASED_OUTPATIENT_CLINIC_OR_DEPARTMENT_OTHER): Payer: BC Managed Care – PPO | Admitting: Family

## 2012-06-29 ENCOUNTER — Other Ambulatory Visit (HOSPITAL_BASED_OUTPATIENT_CLINIC_OR_DEPARTMENT_OTHER): Payer: BC Managed Care – PPO | Admitting: Lab

## 2012-06-29 ENCOUNTER — Other Ambulatory Visit: Payer: BC Managed Care – PPO | Admitting: Lab

## 2012-06-29 VITALS — BP 142/88 | HR 70 | Temp 98.1°F | Resp 20 | Ht 64.0 in | Wt 171.0 lb

## 2012-06-29 DIAGNOSIS — Z5181 Encounter for therapeutic drug level monitoring: Secondary | ICD-10-CM

## 2012-06-29 DIAGNOSIS — C50419 Malignant neoplasm of upper-outer quadrant of unspecified female breast: Secondary | ICD-10-CM

## 2012-06-29 DIAGNOSIS — C50919 Malignant neoplasm of unspecified site of unspecified female breast: Secondary | ICD-10-CM

## 2012-06-29 DIAGNOSIS — C50911 Malignant neoplasm of unspecified site of right female breast: Secondary | ICD-10-CM

## 2012-06-29 DIAGNOSIS — Z7901 Long term (current) use of anticoagulants: Secondary | ICD-10-CM

## 2012-06-29 DIAGNOSIS — I2699 Other pulmonary embolism without acute cor pulmonale: Secondary | ICD-10-CM

## 2012-06-29 DIAGNOSIS — Z853 Personal history of malignant neoplasm of breast: Secondary | ICD-10-CM

## 2012-06-29 DIAGNOSIS — Z86718 Personal history of other venous thrombosis and embolism: Secondary | ICD-10-CM

## 2012-06-29 LAB — CBC WITH DIFFERENTIAL/PLATELET
Eosinophils Absolute: 0.1 10*3/uL (ref 0.0–0.5)
MCV: 88.2 fL (ref 79.5–101.0)
MONO#: 0.3 10*3/uL (ref 0.1–0.9)
MONO%: 6.7 % (ref 0.0–14.0)
NEUT#: 2.8 10*3/uL (ref 1.5–6.5)
RBC: 4.33 10*6/uL (ref 3.70–5.45)
RDW: 15 % — ABNORMAL HIGH (ref 11.2–14.5)
WBC: 4.3 10*3/uL (ref 3.9–10.3)

## 2012-06-29 LAB — COMPREHENSIVE METABOLIC PANEL (CC13)
Albumin: 4.1 g/dL (ref 3.5–5.0)
Alkaline Phosphatase: 92 U/L (ref 40–150)
Glucose: 88 mg/dl (ref 70–99)
Potassium: 3.9 mEq/L (ref 3.5–5.1)
Sodium: 141 mEq/L (ref 136–145)
Total Protein: 7.2 g/dL (ref 6.4–8.3)

## 2012-06-29 LAB — PROTIME-INR: Protime: 12 Seconds (ref 10.6–13.4)

## 2012-06-29 MED ORDER — SODIUM CHLORIDE 0.9 % IJ SOLN
10.0000 mL | INTRAMUSCULAR | Status: DC | PRN
  Administered 2012-06-29: 10 mL via INTRAVENOUS
  Filled 2012-06-29: qty 10

## 2012-06-29 MED ORDER — HEPARIN SOD (PORK) LOCK FLUSH 100 UNIT/ML IV SOLN
500.0000 [IU] | Freq: Once | INTRAVENOUS | Status: AC
  Administered 2012-06-29: 500 [IU] via INTRAVENOUS
  Filled 2012-06-29: qty 5

## 2012-06-29 NOTE — Progress Notes (Signed)
Patient ID: DEARA BOBER, female   DOB: 04/10/1956, 56 y.o.   MRN: [id number] CSN: [id number]    Identifying Statement: Brandi Bates is a 56 y.o. 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   History of Present Illness: The patient felt a mass in her left axilla several months ago. She did not bring this to her physician's attention because she wasn't sure it really meant anything, but she asked her daughter, and her daughter asked a nurse fried, and they told her she better get on and have this evaluated, so she did bring it to Dr. Frederik Pear attention and he set her up for diagnostic mammography and ultrasonography performed at [facility] on September 22, 2010. Dr. Tilda Burrow was able to demonstrate a spiculated density at the 12 o'clock position of the breast with pleomorphic calcifications measuring about 4 cm. There was a 2nd focal rounded area of increased density and with the axilla, there was an ovoid mass measuring up to 3.8 cm. Ultrasound showed the area of heterogeneous decreased echogenicity as well as the 2nd discrete focus. The right breast showed only a cystic mass measuring 1 cm which was of no consequence. Biopsy was performed of the left breast mass only on the same day. The pathology report ([id number]) biopsied 2 masses in the left breast. The second mass was "located more superiorly" and was also identified and biopsied. The pathology report labels both masses "at 12 o'clock" so we will have to clarify this issue. At any rate, both masses were Grade 3 invasive ductal carcinoma. The first one was estrogen receptor positive at 61% but progesterone receptor negative with a proliferation marker of 95%. The second one was ER positive at 98% and PR "positive" at 3%, also with a proliferation marker of 95%. Both showed no HER2 amplification.   With this information, the patient was referred for breast MRI. This was performed September 30, 2010  and showed the main breast mass to measure 3.8 cm. The posterior mass measured 6 millimeters. There were some confluent axillary masses to a maximum measurement of 6.8 cm. The patient underwent definitive surgery 10/23/2011 with results and subsequent treatment as detailed below    Interval History: Brandi Bates returns today for follow up of her breast cancer. Since the last office visit on 03/29/2013, she reports that she has been doing well.  She states that her muscle aches continue (which she attributes to Anastrozole), but she states that the aches have improved.  Brandi Bates also reports hot flashes during the daytime hours.  She denies any other symptomatology including fever, chills, unusual bleeding, breast changes, urinary or bowel changes, SOB, N/V/D or constipation.  She continues to work full-time and states that she has been doing well.    Review of Systems: A 10 point review of systems was completed and is negative except as noted above.   Past Medical History: Past Medical History  Diagnosis Date  . Numbness of feet   . GERD (gastroesophageal reflux disease)   . Chronic low back pain     "everyday"  . Eczema   . Lymphedema of arm     left  . History of radiation therapy 04/23/11 thru 06/08/11    L breast  . Breast wound   . PONV (postoperative nausea and vomiting)   . Migraines   . Neuromuscular disorder     raynauds syndrome   . Shortness of breath on  exertion   . Anxiety   . Breast cancer 10/23/10    s/p L mastectomy, chemo/radiation, er/pr +, Her2 -  . Breast cancer 11/10/11    S/P right mastectomy  . Allergy   . Hepatic steatosis 11/20/11    severe   . Bladder cystocele 11/20/11    low-lying ct result  . Pulmonary thromboembolism 11/20/11    ct positive acute w/i segmental branches of right lower lobe    Past Surgical History: Past Surgical History  Procedure Date  . Abdominal hysterectomy   . Gallbladder surgery   . Breast surgery   . Irrigation and  debridement abscess 06/16/2011    Procedure: IRRIGATION AND DEBRIDEMENT ABSCESS;  Surgeon: Shelly Rubenstein, MD;  Location: [facility];  Service: General;  Laterality: Left;  incision and drainage of left chest wall abcess  . Bilateral salpingoophorectomy 11/10/11    laparoscopy  . Diagnostic laparoscopy   . Mastectomy 11/10/11    right; w/SNB  . Mastectomy modified radical 10/23/11    left  . Cholecystectomy 1999  . Tubal ligation 1979  . Mastectomy w/ sentinel node biopsy 11/10/2011    Procedure: MASTECTOMY WITH SENTINEL LYMPH NODE BIOPSY;  Surgeon: Shelly Rubenstein, MD;  Location: [facility];  Service: General;  Laterality: Right;  . Portacath placement     right subclavian    Family History: Family History  Problem Relation Age of Onset  . Cancer Mother     lung  . Hypertension Mother   . Cancer Father     lung  . Cancer Brother     BRAIN CANCER  . Hypertension Brother   . Cancer Cousin      2 PATERNAL COUSINS - BREAST CA  . Hypertension Sister   . Hypertension Maternal Uncle   . Hypertension Maternal Grandmother     Gynecologic History: She is GX P1. First pregnancy to term at age 31. She never took hormone replacement.    Social History: History  Substance Use Topics  . Smoking status: Former Smoker -- 2.0 packs/day for 28 years    Types: Cigarettes    Quit date: 05/31/1997  . Smokeless tobacco: Never Used  . 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 Her husband, [name], is a Therapist, music for an [employer]. Daughter [name] works at the same office as her mother. Son [name] works for [employer]. The patient has 7 grandchildren. She attends a [employer].   Advanced Directives: In place  Health Maintenance: Colonoscopy:  PAP: s/p hysterectomy  Bone density: SOLIS October 2012, T - 1.4 at [facility]    Allergies: Allergies  Allergen Reactions  .  Morphine And Related Hives and Itching    All over the body  . Omeprazole Magnesium Nausea Only  . Gabapentin Other (See Comments)    Unable to sleep  . Latex Rash    Only where touched    Medications: Current Outpatient Prescriptions  Medication Sig Dispense Refill  . anastrozole (ARIMIDEX) 1 MG tablet Take 1 mg by mouth daily.      . B Complex Vitamins (B COMPLEX-B12) TABS Take 1 tablet by mouth daily.      . calcium carbonate (TUMS - DOSED IN MG ELEMENTAL CALCIUM) 500 MG chewable tablet Chew 1 tablet by mouth as needed.      Marland Kitchen LORazepam (ATIVAN) 0.5 MG tablet  Take 1 tablet (0.5 mg total) by mouth every 8 (eight) hours.  30 tablet  0  . LYSINE PO Take 1 tablet by mouth daily.       . ondansetron (ZOFRAN) 8 MG tablet Take 8 mg by mouth Every 12 hours as needed. For n/v      . polyethylene glycol (MIRALAX / GLYCOLAX) packet Take 17 g by mouth daily as needed. For constipation      . [DISCONTINUED] gabapentin (NEURONTIN) 300 MG capsule daily.       No current facility-administered medications for this visit.   Facility-Administered Medications Ordered in Other Visits  Medication Dose Route Frequency Provider Last Rate Last Dose  . [COMPLETED] heparin lock flush 100 unit/mL  500 Units Intravenous Once Lowella Dell, MD   500 Units at 06/29/12 1433  . sodium chloride 0.9 % injection 10 mL  10 mL Intravenous PRN Lowella Dell, MD   10 mL at 06/29/12 1433     Physical Exam: Blood pressure 142/88, pulse 70, temperature 98.1 F (36.7 C), temperature source Oral, resp. rate 20, height 5\' 4"  (1.626 m), weight 171 lb (77.565 kg).  General appearance: Alert, cooperative, well nourished, no apparent distress Head: Normocephalic, without obvious abnormality, atraumatic Eyes: Conjunctivae/corneas clear, PERRLA, EOMI Nose: Nares, septum and mucosa are normal, no drainage or sinus tenderness Neck: No adenopathy, supple, symmetrical, trachea midline, thyroid not enlarged, no  tenderness Resp: Clear to auscultation bilaterally Breasts: Status post bilateral mastectomies, there is no evidence of local recurrence, no masses, left axillary tenderness Cardio: Regular rate and rhythm, S1, S2 normal, no murmur, click, rub or gallop GI: Soft, distended, non-tender, hypoactive bowel sounds, no organomegaly Extremities: Extremities normal, atraumatic, no cyanosis or edema Lymph nodes: Cervical, supraclavicular, and axillary nodes normal, left axillary area is tender Neurologic: Grossly normal   Laboratory Data: Results for orders placed in visit on 06/29/12 (from the past 48 hour(s))  CBC WITH DIFFERENTIAL     Status: Abnormal   Collection Time   06/29/12  1:22 PM      Component Value Range Comment   WBC 4.3  3.9 - 10.3 10e3/uL    NEUT# 2.8  1.5 - 6.5 10e3/uL    HGB 12.9  11.6 - 15.9 g/dL    HCT 29.5  28.4 - 13.2 %    Platelets 192  145 - 400 10e3/uL    MCV 88.2  79.5 - 101.0 fL    MCH 29.9  25.1 - 34.0 pg    MCHC 33.8  31.5 - 36.0 g/dL    RBC 4.40  1.02 - 7.25 10e6/uL    RDW 15.0 (*) 11.2 - 14.5 %    lymph# 1.1  0.9 - 3.3 10e3/uL    MONO# 0.3  0.1 - 0.9 10e3/uL    Eosinophils Absolute 0.1  0.0 - 0.5 10e3/uL    Basophils Absolute 0.0  0.0 - 0.1 10e3/uL    NEUT% 65.1  38.4 - 76.8 %    LYMPH% 25.0  14.0 - 49.7 %    MONO% 6.7  0.0 - 14.0 %    EOS% 2.9  0.0 - 7.0 %    BASO% 0.3  0.0 - 2.0 %   PROTIME-INR     Status: Abnormal   Collection Time   06/29/12  1:22 PM      Component Value Range Comment   Protime 12.0  10.6 - 13.4 Seconds    INR 1.00 (*) 2.00 - 3.50    Lovenox  No        Imaging: No results found.   Assessment/Plan: Brandi Bates is a 56 year-old [place],  woman with a BRCA mutation of unknown clinical significance.   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   (2) S/p 4 cycles of adjuvant dose dense doxorubicin and cyclophosphamide, and 11 doses of weekly Paclitaxel.  (3) S/p radiation to the left chest and regional nodes completed November 2012, at which time she started Tamoxifen.  (4) S/p right mastectomy with sentinel lymph node sampling 11/10/2011 for a pT1b pN0, stage IA invasive ductal carcinoma, grade 1, with insufficient tumor for estrogen and progesterone testing, but HER-2 negative.   (5) Anastrozole started May 2013, interrupted June 2013, resumed July 2013.  (6) Post-op pulmonary embolus documented 11/20/2011 - completed 6 months of Coumadin therapy in October 2013.  PLAN: Brandi Bates could be having some of the arthralgias/myalgias associated with aromatase inhibitors, but she has learned to live with this drug. Avoidance of Goody powders was again discussed with the patient. The patient stated that Coumadin therapy was discontinued in October 2013 by Dr. Darnelle Catalan.  Brandi Bates received a Port-A-Cath flush today.  She is scheduled for another Port-A-Cath flush on 08/24/2012.  We plan to see her again in 3 months around 10/05/2012, at which time we will recheck CMP, CBC, LDH and CA 27.29.    Brandi Bates is encouraged to contact us in the interim if she has any questions or concerns.     Larina Bras, NP-C 06/29/2012, 2:40 PM

## 2012-06-29 NOTE — Patient Instructions (Signed)
Call MD for problems 

## 2012-06-29 NOTE — Telephone Encounter (Signed)
gve the pt her jan,march 2014 appt calendar

## 2012-06-29 NOTE — Patient Instructions (Addendum)
Please call us if you have any questions or concerns. 

## 2012-06-30 ENCOUNTER — Other Ambulatory Visit: Payer: Self-pay | Admitting: Oncology

## 2012-08-30 ENCOUNTER — Encounter (INDEPENDENT_AMBULATORY_CARE_PROVIDER_SITE_OTHER): Payer: Self-pay | Admitting: Surgery

## 2012-09-10 ENCOUNTER — Other Ambulatory Visit: Payer: Self-pay

## 2012-10-05 ENCOUNTER — Ambulatory Visit (HOSPITAL_BASED_OUTPATIENT_CLINIC_OR_DEPARTMENT_OTHER): Payer: BC Managed Care – PPO | Admitting: Oncology

## 2012-10-05 ENCOUNTER — Other Ambulatory Visit (HOSPITAL_BASED_OUTPATIENT_CLINIC_OR_DEPARTMENT_OTHER): Payer: BC Managed Care – PPO | Admitting: Lab

## 2012-10-05 ENCOUNTER — Telehealth: Payer: Self-pay | Admitting: Oncology

## 2012-10-05 VITALS — BP 137/79 | HR 78 | Temp 98.7°F | Resp 20 | Ht 64.0 in | Wt 172.7 lb

## 2012-10-05 DIAGNOSIS — C50911 Malignant neoplasm of unspecified site of right female breast: Secondary | ICD-10-CM

## 2012-10-05 DIAGNOSIS — C50919 Malignant neoplasm of unspecified site of unspecified female breast: Secondary | ICD-10-CM

## 2012-10-05 DIAGNOSIS — Z170 Estrogen receptor positive status [ER+]: Secondary | ICD-10-CM

## 2012-10-05 DIAGNOSIS — Z853 Personal history of malignant neoplasm of breast: Secondary | ICD-10-CM

## 2012-10-05 DIAGNOSIS — C50419 Malignant neoplasm of upper-outer quadrant of unspecified female breast: Secondary | ICD-10-CM

## 2012-10-05 DIAGNOSIS — I2699 Other pulmonary embolism without acute cor pulmonale: Secondary | ICD-10-CM

## 2012-10-05 LAB — CBC WITH DIFFERENTIAL/PLATELET
Basophils Absolute: 0 10*3/uL (ref 0.0–0.1)
EOS%: 2.3 % (ref 0.0–7.0)
HGB: 13.1 g/dL (ref 11.6–15.9)
MCH: 28.7 pg (ref 25.1–34.0)
MCV: 87.3 fL (ref 79.5–101.0)
MONO%: 4.8 % (ref 0.0–14.0)
RBC: 4.54 10*6/uL (ref 3.70–5.45)
RDW: 14.6 % — ABNORMAL HIGH (ref 11.2–14.5)

## 2012-10-05 LAB — COMPREHENSIVE METABOLIC PANEL (CC13)
ALT: 23 U/L (ref 0–55)
AST: 16 U/L (ref 5–34)
Albumin: 3.5 g/dL (ref 3.5–5.0)
Alkaline Phosphatase: 91 U/L (ref 40–150)
BUN: 13.8 mg/dL (ref 7.0–26.0)
CO2: 28 mEq/L (ref 22–29)
Calcium: 9.3 mg/dL (ref 8.4–10.4)
Chloride: 106 mEq/L (ref 98–107)
Creatinine: 0.8 mg/dL (ref 0.6–1.1)
Glucose: 125 mg/dl — ABNORMAL HIGH (ref 70–99)
Potassium: 3.7 mEq/L (ref 3.5–5.1)
Sodium: 142 mEq/L (ref 136–145)
Total Bilirubin: 0.35 mg/dL (ref 0.20–1.20)
Total Protein: 6.9 g/dL (ref 6.4–8.3)

## 2012-10-05 LAB — LACTATE DEHYDROGENASE (CC13): LDH: 200 U/L (ref 125–245)

## 2012-10-05 NOTE — Telephone Encounter (Signed)
Pt sent to see [facility] per 3/12 pof and given appt schedule for September.

## 2012-10-05 NOTE — Progress Notes (Signed)
ID: Brandi Bates   DOB: 06-12-1956  MR#: [id number]  CSN#:[id number]  PCP: Brandi Heck, MD   HISTORY OF PRESENT ILLNESS: The patient felt a mass in her left axilla several months ago.  She did not bring this to her physician's attention because she wasn't sure it really meant anything, but she asked her daughter, and her daughter asked a nurse fried, and they told her she better get on and have this evaluated, so she did bring it to Dr. Clayborn Bates attention and he set her up for diagnostic mammography and ultrasonography performed at [facility] on September 22, 2010.  Dr. Marcelo Bates was able to demonstrate a spiculated density at the 12 o'clock position of the breast with pleomorphic calcifications measuring about 4 cm.  There was a 2nd focal rounded area of increased density and with the axilla, there was an ovoid mass measuring up to 3.8 cm. Ultrasound showed the area of heterogeneous decreased echogenicity as well as the 2nd discrete focus.  The right breast showed only a cystic mass measuring 1 cm which was of no consequence.  Biopsy was performed of the left breast mass only on the same day.  The pathology report (Brandi Bates) biopsied 2 masses in the left breast.  The second mass was "located more superiorly" and was also identified and biopsied.  The pathology report labels both masses "at 12 o'clock" so we will have to clarify this issue.  At any rate, both masses were Grade 3 invasive ductal carcinoma.  The first one was estrogen receptor positive at 61% but progesterone receptor negative with a proliferation marker of 95%.  The second one was ER positive at 98% and PR "positive" at 3%, also with a proliferation marker of 95%.  Both showed no HER2 amplification.  With this information, the patient was referred for breast MRI. This was performed September 30, 2010 and showed the main breast mass to measure 3.8 cm.  The posterior mass measured 6 millimeters. There were some confluent axillary masses to a  maximum measurement of 6.8 cm. The patient underwent definitive surgery 10/23/2011 with results and subsequent treatment as detailed below   INTERVAL HISTORY: Brandi Bates returns today for followup of her breast cancer. She is tolerating the anastrozole well, with only some hot flashes as a side effect. The biggest problem she is having is her pills. She tells me that collection agencies and even a lawyer have been calling her. She tells me she pays what she can every month and is "never missed a payment", but she just cannot pay all her bills. She was considering not coming today but I have encouraged her to meet with our billing specialist and if that doesn't work with our director of medical oncology, Brandi Bates:  She is not exercising regularly. She has a couple of "moles" in her right leg but she wants me to look at. She has some sleep disturbance issues. Otherwise a detailed review of systems today was noncontributory  Past Medical History  Diagnosis Date  . Numbness of feet   . GERD (gastroesophageal reflux disease)   . Chronic low back pain     "everyday"  . Eczema   . Lymphedema of arm     left  . History of radiation therapy 04/23/11 thru 06/08/11    L breast  . Breast wound   . PONV (postoperative nausea and vomiting)   . Migraines   . Neuromuscular disorder     raynauds syndrome   .  Shortness of breath on exertion   . Anxiety   . Breast cancer 10/23/10    s/p L mastectomy, chemo/radiation, er/pr +, Her2 -  . Breast cancer 11/10/11    S/P right mastectomy  . Allergy   . Hepatic steatosis 11/20/11    severe   . Bladder cystocele 11/20/11    low-lying ct result  . Pulmonary thromboembolism 11/20/11    ct positive acute w/i segmental branches of right lower lobe  Significant for history of migraines, history of GERD, history of chronic constipation, history of chronic low back pain, history of tobacco abuse, the patient quitting in 1998 with approximately a  50 pack-year history prior to that.  The patient is status post tubal ligation, status post simple hysterectomy without salpingo-oophorectomy and status post cholecystectomy.  PAST SURGICAL HISTORY: Past Surgical History  Procedure Laterality Date  . Abdominal hysterectomy    . Gallbladder surgery    . Breast surgery    . Irrigation and debridement abscess  06/16/2011    Procedure: IRRIGATION AND DEBRIDEMENT ABSCESS;  Surgeon: Brandi Rubenstein, MD;  Location: [facility];  Service: General;  Laterality: Left;  incision and drainage of left chest wall abcess  . Bilateral salpingoophorectomy  11/10/11    laparoscopy  . Diagnostic laparoscopy    . Mastectomy  11/10/11    right; w/[facility]B  . Mastectomy modified radical  10/23/11    left  . Cholecystectomy  1999  . Tubal ligation  1979  . Mastectomy w/ sentinel node biopsy  11/10/2011    Procedure: MASTECTOMY WITH SENTINEL LYMPH NODE BIOPSY;  Surgeon: Brandi Rubenstein, MD;  Location: [facility] OR;  Service: General;  Laterality: Right;  . Portacath placement      right subclavian    FAMILY HISTORY Family History  Problem Relation Age of Onset  . Cancer Mother     lung  . Hypertension Mother   . Cancer Father     lung  . Cancer Brother     BRAIN CANCER  . Hypertension Brother   . Cancer Cousin      2 PATERNAL COUSINS - BREAST CA  . Hypertension Sister   . Hypertension Maternal Uncle   . Hypertension Maternal Grandmother   The patient's father died at the age of 80 and the patient's mother at the age of 38.  Both were smokers and both had lung cancer. The patient had one brother who died at age 61 with glioblastoma multiforme.  She has one surviving brother, one surviving sister who is present today, and a half-brother.  There are two cousins, both on the father's side, who had breast cancer in their 40s.  There is one uncle with Brandi Bates disease.  GYNECOLOGIC HISTORY: She is GX P1.  First pregnancy to term at age 25.  She never took hormone  replacement.    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  Her husband, Brandi Bates, is a Therapist, music for an [employer].  Daughter [name] works at the same office as her mother.  Son Harvie Bates works for [employer].  The patient has 7 grandchildren. She attends a [employer].     ADVANCED DIRECTIVES: in place  HEALTH MAINTENANCE: History  Substance Use Topics  . Smoking status: Former Smoker -- 2.00 packs/day for 28 years    Types: Cigarettes    Quit date: 05/31/1997  .  Smokeless tobacco: Never Used  . Alcohol Use: No     Colonoscopy:  PAP: s/p hysterectomy  Bone density: SOLIS October 2012, T - 1.4 at [facility]  Lipid panel:  Allergies  Allergen Reactions  . Morphine And Related Hives and Itching    All over the body  . Omeprazole Magnesium Nausea Only  . Gabapentin Other (See Comments)    Unable to sleep  . Latex Rash    Only where touched    Current Outpatient Prescriptions  Medication Sig Dispense Refill  . anastrozole (ARIMIDEX) 1 MG tablet Take 1 mg by mouth daily.      . B Complex Vitamins (B COMPLEX-B12) TABS Take 1 tablet by mouth daily.      . calcium carbonate (TUMS - DOSED IN MG ELEMENTAL CALCIUM) 500 MG chewable tablet Chew 1 tablet by mouth as needed.      Marland Kitchen LORazepam (ATIVAN) 0.5 MG tablet Take 1 tablet (0.5 mg total) by mouth every 8 (eight) hours.  30 tablet  0  . LYSINE PO Take 1 tablet by mouth daily.       . ondansetron (ZOFRAN) 8 MG tablet Take 8 mg by mouth Every 12 hours as needed. For n/v      . polyethylene glycol (MIRALAX / GLYCOLAX) packet Take 17 g by mouth daily as needed. For constipation      . [DISCONTINUED] gabapentin (NEURONTIN) 300 MG capsule daily.       No current facility-administered medications for this visit.    OBJECTIVE:  Filed Vitals:   10/05/12 1305  BP: 137/79  Pulse: 78  Temp: 98.7 F (37.1 C)  Resp: 20     Body mass index is  29.63 kg/(m^2).    ECOG FS: 2  Middle-aged white woman who appears well Sclerae unicteric Oropharynx clear No peripheral adenopathy, and specifically there is no axillary adenopathy bilaterally Lungs no rales or rhonchi Heart regular rate and rhythm Abd benign MSK no focal spinal tenderness, no peripheral edema; limited range of motion left upper extremity Neuro: nonfocal, well oriented, appropriate affect Breasts: Status post bilateral mastectomies. There is no evidence of local recurrence.   LAB RESULTS: Lab Results  Component Value Date   WBC 4.3 10/05/2012   NEUTROABS 2.9 10/05/2012   HGB 13.1 10/05/2012   HCT 39.7 10/05/2012   MCV 87.3 10/05/2012   PLT 182 10/05/2012      Chemistry      Component Value Date/Time   NA 142 10/05/2012 1234   NA 142 12/25/2011 1005   K 3.7 10/05/2012 1234   K 3.9 12/25/2011 1005   CL 106 10/05/2012 1234   CL 106 12/25/2011 1005   CO2 28 10/05/2012 1234   CO2 21 12/25/2011 1005   BUN 13.8 10/05/2012 1234   BUN 11 12/25/2011 1005   CREATININE 0.8 10/05/2012 1234   CREATININE 0.65 12/25/2011 1005      Component Value Date/Time   CALCIUM 9.3 10/05/2012 1234   CALCIUM 9.7 12/25/2011 1005   ALKPHOS 91 10/05/2012 1234   ALKPHOS 65 12/25/2011 1005   AST 16 10/05/2012 1234   AST 20 12/25/2011 1005   ALT 23 10/05/2012 1234   ALT 30 12/25/2011 1005   BILITOT 0.35 10/05/2012 1234   BILITOT 0.3 12/25/2011 1005       Lab Results  Component Value Date   LABCA2 26 12/25/2011    No results found for this basename: INR,  in the last 168 hours  No results found for  this basename: UACOL, UAPR, USPG, UPH, UTP, UGL, UKET, UBIL, UHGB, UNIT, UROB, ULEU, UEPI, UWBC, URBC, UBAC, CAST, CRYS, UCOM, BILUA,  in the last 72 hours   STUDIES: No results found.  ASSESSMENT:56 y.o.  [place] woman with a BRCA mutation of unknown clinical significance  (1) status post left modified radical mastectomy March of 2012 for a T3 N3a (stage IIIC)  invasive ductal carcinoma, grade 3,   which was strongly estrogen receptor positive, progesterone receptor and HER2 negative, with an MIB-1 of 95%.   (2) s/p 4 cycles of adjuvant dose dense doxorubicin and cyclophosphamide, and 11 doses of weekly paclitaxel,   (3) s/p radiation to the left chest and regional nodes completed November 2012, at which time she started tamoxifen  (4) s/p right mastectomy with sentinel lymph node sampling 11/10/2011 for a pT1b pN0, stage IA invasive ductal carcinoma, grade 1, with insufficient tumor for estrogen and progesterone testing, but HER-2 negative.  (5) anastrozole started May 2013, interrupted June 2013, resumed July 2013  (6) post-op pulmonary embolus documented 11/20/2011, completed over 6 months of Coumadin anticoagulation  PLAN: Rya is doing well, and the plan is to continue anastrozole for total of 5 years. She will need a bone density sometime within the next year.  She is very concerned about the bills situation. Again, I have asked her to meet with our billing specialists and are director of medical oncology as needed. She is planning to write a letter of complaint and of course if she does we will pass that along. Otherwise she will see [place] again in 6 months. I have encouraged her not to collect her healthcare because of cost issues. I reassured her that we are a nonprofit hospital. She knows to call for any problems that may develop before the next visit.   MAGRINAT,GUSTAV C    10/05/2012

## 2012-10-14 ENCOUNTER — Encounter: Payer: Self-pay | Admitting: Oncology

## 2012-10-14 NOTE — Progress Notes (Signed)
Patient came in the office upset. She is now with Collection agency and very upset. She and her hubby's income is 65k per year, so she is Designer, television/film set for financial assistance. She says if we can't help her with bill, she will stop all visits. She says the more bills the more she can't pay. They are paying all they can pay, but billing is saying not enough and they can't afford anymore with all the bills they have going out. She wants to sw with Trey Paula I will forward to Darlena to see if anything else out there that may be able to assist her with. I advised her not to get upset and stop her treatments. She has my card.

## 2012-10-14 NOTE — Progress Notes (Signed)
Patient came in the office upset. She is now with Collection agency and very upset. She and her hubby's income is 65k per year, so she is overqualified for financial assistance. She says if we can't help her with bill, she will stop all visits. She says the more bills the more she can't pay. They are paying all they can pay, but billing is saying not enough and they can't afford anymore with all the bills they have going out. She wants to sw with Jeff I will forward to Darlena to see if anything else out there that may be able to assist her with. I advised her not to get upset and stop her treatments. She has my card.  °

## 2012-10-24 ENCOUNTER — Ambulatory Visit (INDEPENDENT_AMBULATORY_CARE_PROVIDER_SITE_OTHER): Payer: BC Managed Care – PPO | Admitting: Surgery

## 2012-10-24 ENCOUNTER — Encounter (INDEPENDENT_AMBULATORY_CARE_PROVIDER_SITE_OTHER): Payer: Self-pay | Admitting: Surgery

## 2012-10-24 VITALS — BP 124/72 | HR 68 | Resp 16 | Ht 64.0 in | Wt 172.0 lb

## 2012-10-24 DIAGNOSIS — Z853 Personal history of malignant neoplasm of breast: Secondary | ICD-10-CM

## 2012-10-24 NOTE — Progress Notes (Signed)
Subjective:     Patient ID: Brandi Bates, female   DOB: 08-16-1955, 57 y.o.   MRN: [id number]  HPI She is here for long-term followup of her bilateral breast cancers. She still has pain in the left chest and numbness and left arm. Her right-sided doing much better. She is now off Coumadin. She remains on antihormonal therapy.  She still has her Port-A-Cath which has not been used in 2 years  Review of Systems     Objective:   Physical Exam On exam, there is no axillary adenopathy on either side. Both mastectomy incisions are well-healed without palpable masses this includes the previous abscess site    Assessment:     Long-term followup with a history of bilateral breast cancer     Plan:     I will now scheduled her for Port-A-Cath removal

## 2012-10-25 ENCOUNTER — Other Ambulatory Visit: Payer: Self-pay | Admitting: *Deleted

## 2012-10-25 ENCOUNTER — Encounter (HOSPITAL_COMMUNITY): Payer: Self-pay

## 2012-10-25 ENCOUNTER — Other Ambulatory Visit (HOSPITAL_COMMUNITY): Payer: Self-pay | Admitting: Surgery

## 2012-10-25 NOTE — Patient Instructions (Addendum)
20 KEBRINA FRIEND  10/25/2012   Your procedure is scheduled on: 10-28-2012  Report to [facility] at 1145 AM.  Call this number if you have problems the morning of surgery [phone]   Remember:   Do not eat food :After Midnight.   clear liquids midnight until 815 am day of surgery, then nothing by mouth.   Take these medicines the morning of surgery with A SIP OF WATER: ativan if needed, pepcid                                 SEE [facility] PREPARING FOR SURGERY SHEET   Do not wear jewelry, make-up or nail polish.  Do not wear lotions, powders, or perfumes. You may wear deodorant.   Men may shave face and neck.  Do not bring valuables to the hospital.  Contacts, dentures or bridgework may not be worn into surgery.    Patients discharged the day of surgery will not be allowed to drive home.  Name and phone number of your driver: [name] [phone]    Please read over the following fact sheets that you were given: MRSA Information.  Call Birdie Sons RN pre op nurse if needed [phone]    FAILURE TO FOLLOW THESE INSTRUCTIONS MAY RESULT IN THE CANCELLATION OF YOUR SURGERY. PATIENT SIGNATURE___________________________________________

## 2012-10-25 NOTE — Progress Notes (Signed)
ekg 12-01-2011 epic Chest ct 11-20-2011 epic abnormal will repeat with pre op 10-27-2011 Cbc with dif and cmet 10-05-2012 epic

## 2012-10-26 ENCOUNTER — Ambulatory Visit (HOSPITAL_COMMUNITY)
Admission: RE | Admit: 2012-10-26 | Discharge: 2012-10-26 | Disposition: A | Discharge status: Home / Self Care | Payer: BC Managed Care – PPO | Source: Ambulatory Visit | Attending: Surgery | Admitting: Surgery

## 2012-10-26 ENCOUNTER — Encounter (HOSPITAL_COMMUNITY)
Admission: RE | Admit: 2012-10-26 | Discharge: 2012-10-26 | Disposition: A | Discharge status: Home / Self Care | Payer: BC Managed Care – PPO | Source: Ambulatory Visit | Attending: Surgery | Admitting: Surgery

## 2012-10-26 ENCOUNTER — Encounter (HOSPITAL_COMMUNITY): Payer: Self-pay

## 2012-10-26 DIAGNOSIS — Z01812 Encounter for preprocedural laboratory examination: Secondary | ICD-10-CM | POA: Insufficient documentation

## 2012-10-26 DIAGNOSIS — Z01818 Encounter for other preprocedural examination: Secondary | ICD-10-CM | POA: Insufficient documentation

## 2012-10-26 DIAGNOSIS — Z9010 Acquired absence of unspecified breast and nipple: Secondary | ICD-10-CM | POA: Insufficient documentation

## 2012-10-26 HISTORY — DX: Pneumonia, unspecified organism: J18.9

## 2012-10-26 HISTORY — DX: Personal history of antineoplastic chemotherapy: Z92.21

## 2012-10-27 ENCOUNTER — Telehealth (INDEPENDENT_AMBULATORY_CARE_PROVIDER_SITE_OTHER): Payer: Self-pay

## 2012-10-27 NOTE — Telephone Encounter (Signed)
Linda at [facility] called stating pt has had family emergency and wants to r/s her surgery date. I advised Bonita Quin I will send this request to the surgery scheduling dept and Dr Magnus Ivan.

## 2012-10-27 NOTE — Telephone Encounter (Signed)
Msg given to Katie re: pt need to r/s surgery. She will contact pt.

## 2012-10-28 ENCOUNTER — Encounter (HOSPITAL_COMMUNITY): Admission: RE | Payer: Self-pay | Source: Ambulatory Visit

## 2012-10-28 ENCOUNTER — Ambulatory Visit (HOSPITAL_COMMUNITY): Admission: RE | Admit: 2012-10-28 | Payer: BC Managed Care – PPO | Source: Ambulatory Visit | Admitting: Surgery

## 2012-10-28 SURGERY — REMOVAL PORT-A-CATH
Anesthesia: Monitor Anesthesia Care

## 2012-11-26 IMAGING — CT CT ABD-PEL WO/W CM
2 of 5 series · 14 of 32 positions shown, 19 images · IV contrast (omnipaque)
Comparison: PET CT dated 10/10/2010.

CLINICAL DATA: Acute onset back pain, right breast cancer status
post mastectomy, history of left mastectomy, total abdominal
hysterectomy, and cholecystectomy.

CT ABDOMEN AND PELVIS WITHOUT AND WITH CONTRAST
TECHNIQUE: Multidetector CT imaging of the abdomen and pelvis was
performed without contrast material in one or both body regions,
followed by contrast material(s) and further sections in one or
both body regions.
Contrast: 100mL OMNIPAQUE IOHEXOL 300 MG/ML  SOLN

[Series 2: routine abdomen/pelvis with · axial · 0.77mm/px · z∈[-387,-27]mm · 8 of 94 slices shown, 13 images (1 of 2)]
[im 11/94  soft-tissue]
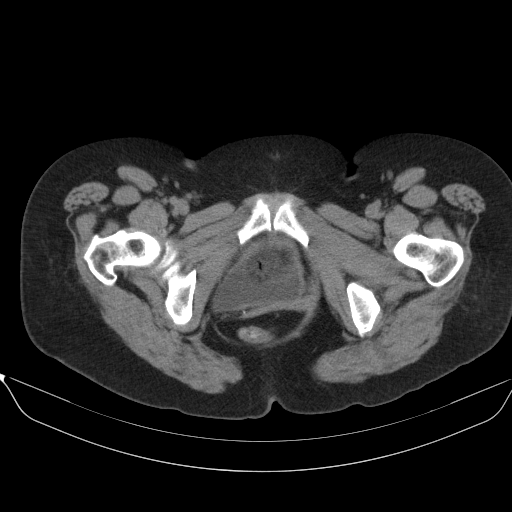
[im 11/94  bone]
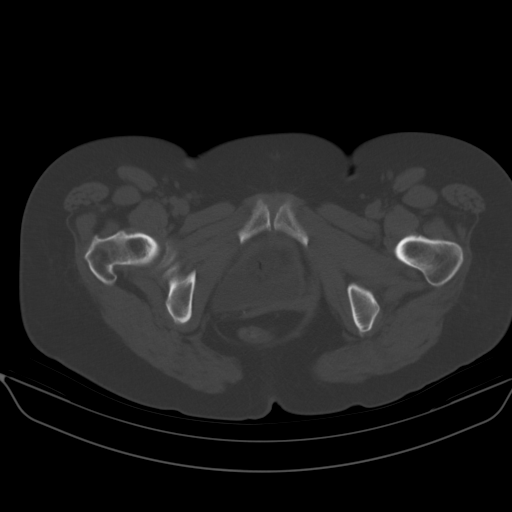
[im 21/94  soft-tissue]
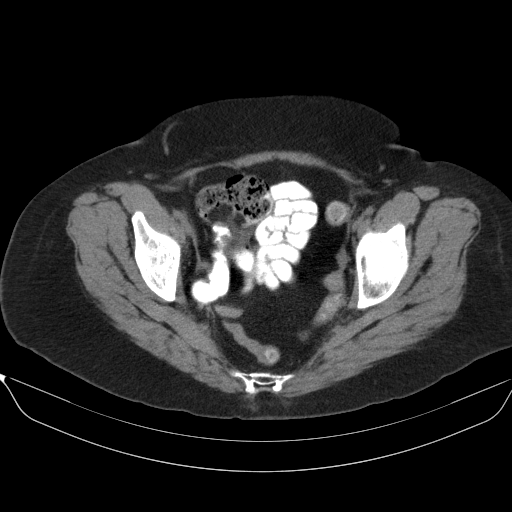
[im 32/94  soft-tissue]
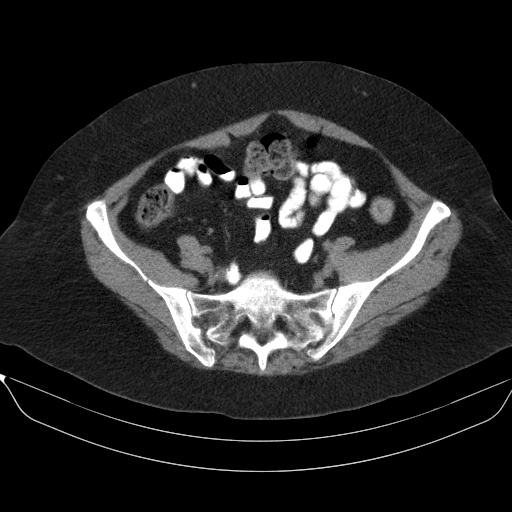
[im 42/94  soft-tissue]
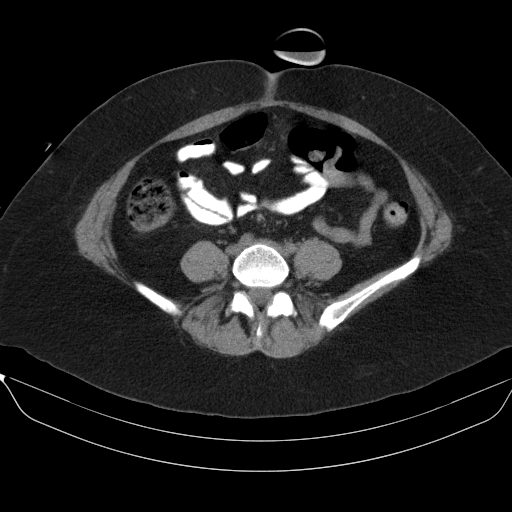
[im 52/94  soft-tissue]
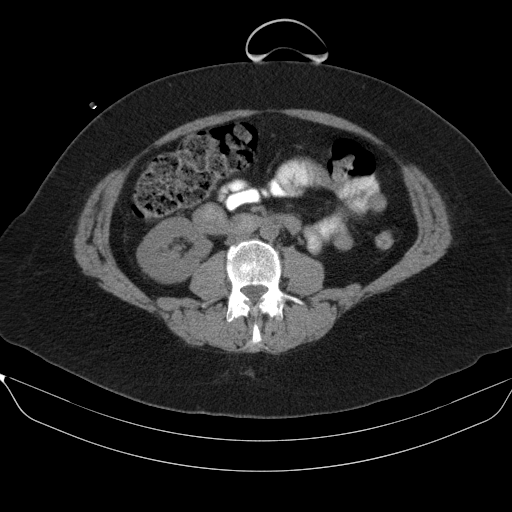
[im 52/94  lung]
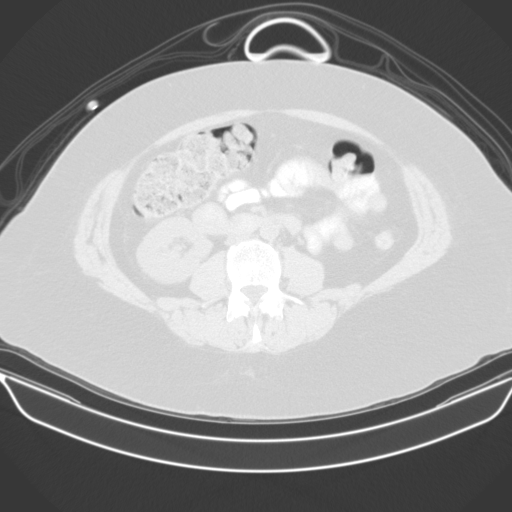
[im 63/94  soft-tissue]
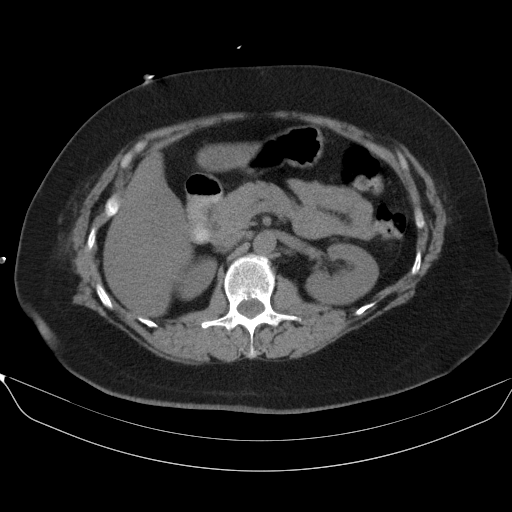
[im 63/94  lung]
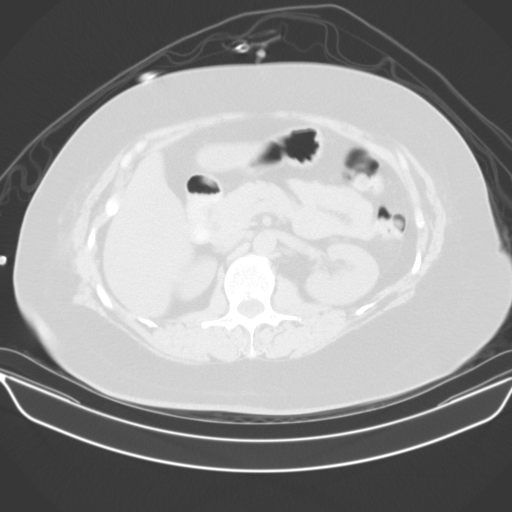
[im 73/94  soft-tissue]
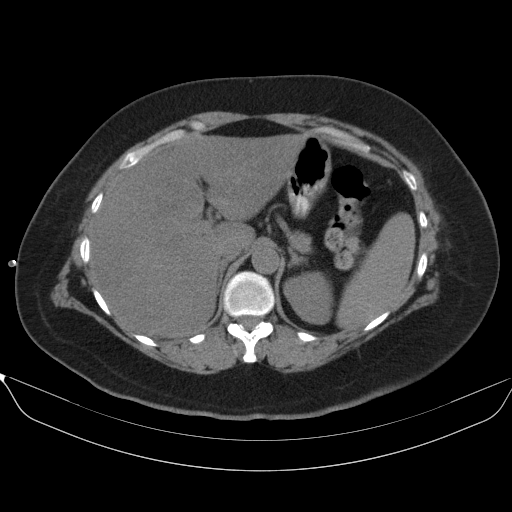
[im 73/94  lung]
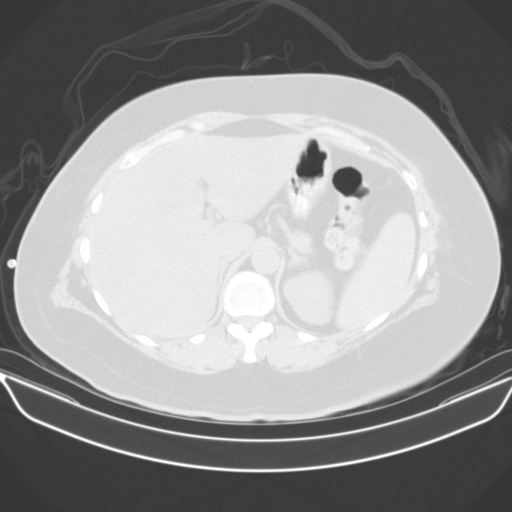
[im 83/94  soft-tissue]
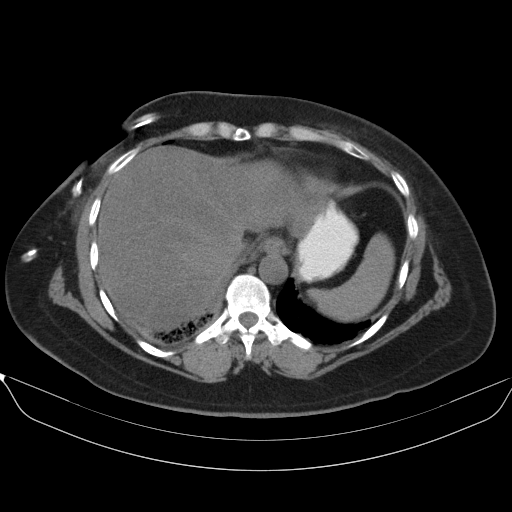
[im 83/94  lung]
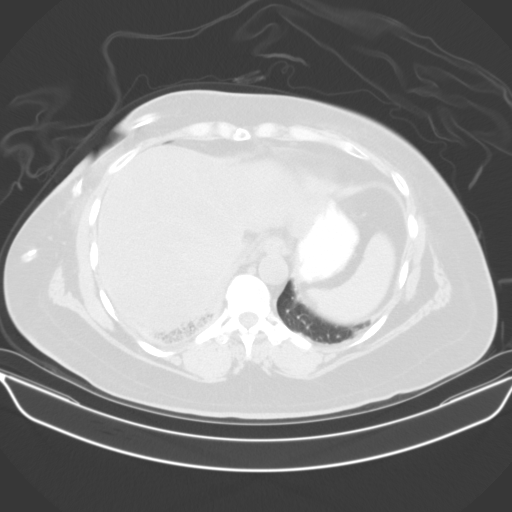

[Series 5: routine abdomen/pelvis with · axial · 0.77mm/px · z∈[-388,-128]mm · 6 of 94 slices shown (2 of 2)]
[im 11/94  soft-tissue]
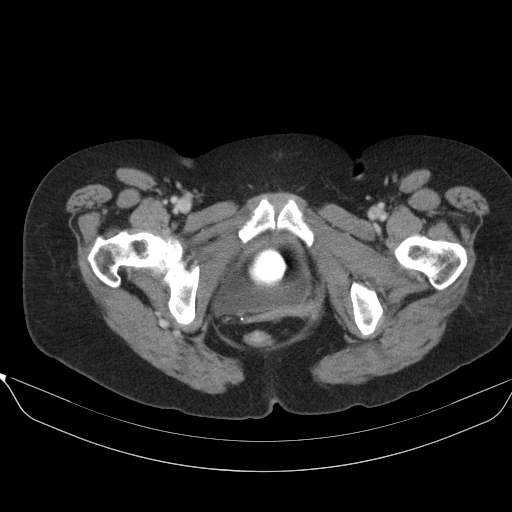
[im 21/94  soft-tissue]
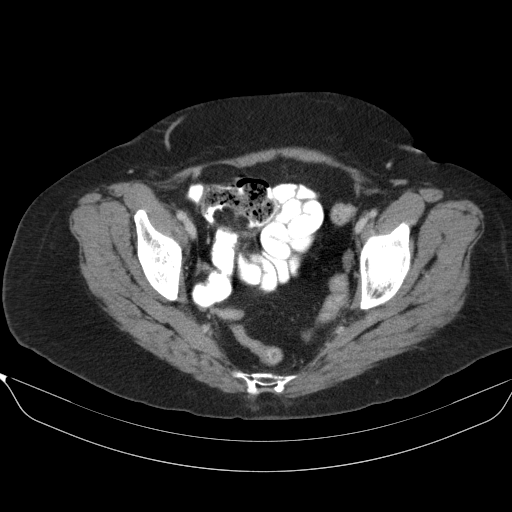
[im 32/94  soft-tissue]
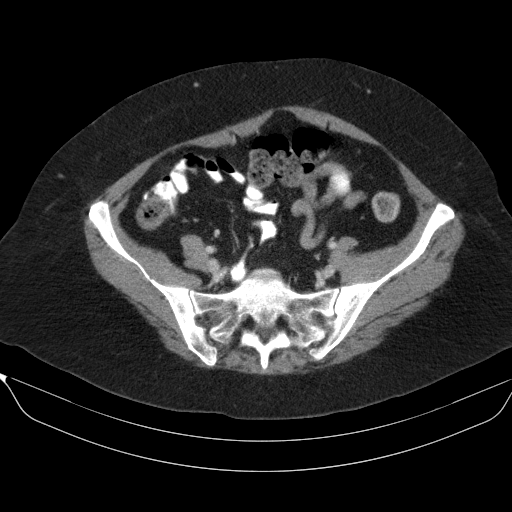
[im 42/94  soft-tissue]
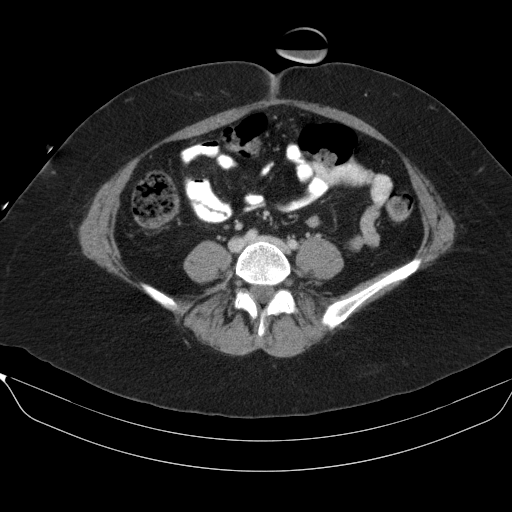
[im 52/94  soft-tissue]
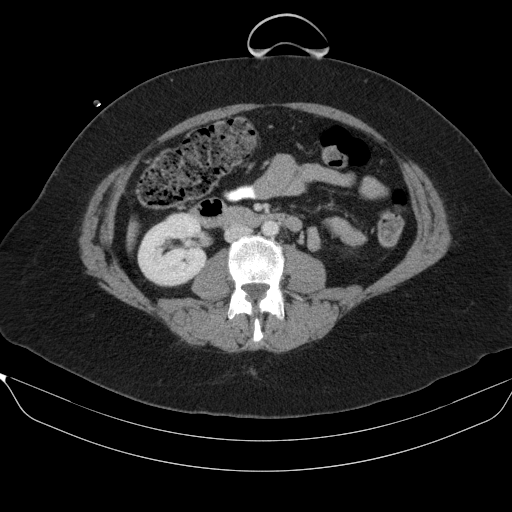
[im 63/94  soft-tissue]
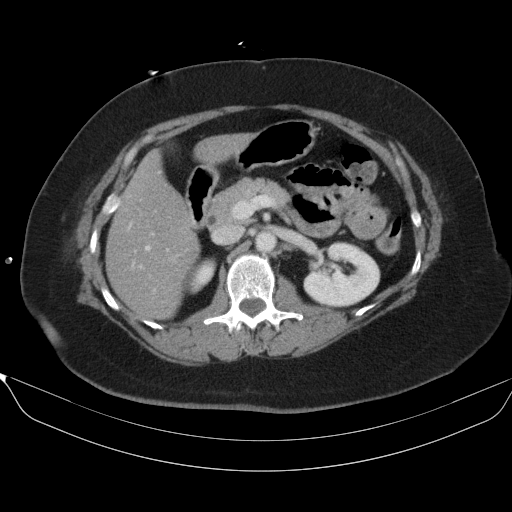

[14 of 32 positions shown; findings below may reference images not displayed]

FINDINGS: Patchy right lower lobe opacities, suspicious for
pneumonia.  Associated small right pleural effusion.

Status post right mastectomy with subcutaneous drain.

Severe hepatic steatosis.

Spleen, pancreas, and adrenal glands within normal limits.

Status post cholecystectomy.  No intrahepatic or extrahepatic
ductal dilatation.

Kidneys are within normal limits.  No enhancing renal lesions.  No
renal calculi or hydronephrosis.

No evidence of bowel obstruction. Normal appendix.  Moderate stool
in the right colon.

No evidence of abdominal aortic aneurysm.

No abdominopelvic ascites.

No suspicious abdominopelvic lymphadenopathy.

Status post hysterectomy.  No adnexal masses.

No ureteral or bladder calculi.  Low-lying bladder/cystocele.

Mild degenerative changes of the visualized thoracolumbar spine.
Left partial sacralization of L5.
IMPRESSION: No renal, ureteral, or bladder calculi.  No hydronephrosis.

Normal appendix.  No evidence of bowel obstruction.  Moderate stool
in the right colon.

Patchy right lower lobe opacities, suspicious for pneumonia.
Associated small right pleural effusion.

Severe hepatic steatosis.

Low-lying bladder/cystocele.

Status post right mastectomy with indwelling surgical drain.

## 2012-11-28 ENCOUNTER — Other Ambulatory Visit: Payer: Self-pay | Admitting: *Deleted

## 2012-11-28 MED ORDER — ANASTROZOLE 1 MG PO TABS: 1.0000 mg | ORAL_TABLET | Freq: Every day | ORAL | Status: DC

## 2012-12-01 ENCOUNTER — Telehealth (INDEPENDENT_AMBULATORY_CARE_PROVIDER_SITE_OTHER): Payer: Self-pay

## 2012-12-01 NOTE — Telephone Encounter (Signed)
Dr Magnus Ivan called the pt and let her know it's ok

## 2012-12-01 NOTE — Telephone Encounter (Signed)
Message copied by Ivory Broad on Thu Dec 01, 2012  3:50 PM ------      Message from: Marin Shutter      Created: Thu Dec 01, 2012 12:40 PM      Regarding: Dr. Magnus Ivan      Contact: [phone]       Pt called wanting to know if it's okay if she gets a tattoo of a pink ribbon on her left shoulder blade.  She has had lymph nodes taken out, and breast sx.  I think she is concerned with the ink being put 'in' her body.  Thx ------

## 2012-12-28 ENCOUNTER — Encounter: Payer: Self-pay | Admitting: Oncology

## 2013-01-30 ENCOUNTER — Telehealth: Payer: Self-pay | Admitting: *Deleted

## 2013-01-30 NOTE — Telephone Encounter (Signed)
Pt called to cancel her appts for 04/05/13 because shes going to the beach. She wants to come in the last week of August. gv appt for 03/20/13 to have labs @8 :45am and a ov @ 9:15am ..the patient is aware...td

## 2013-02-09 ENCOUNTER — Telehealth (INDEPENDENT_AMBULATORY_CARE_PROVIDER_SITE_OTHER): Payer: Self-pay | Admitting: Surgery

## 2013-02-09 NOTE — Telephone Encounter (Signed)
Advised patient of financial responsibility for surgery,per patient will call ball to schedule

## 2013-02-14 ENCOUNTER — Telehealth (INDEPENDENT_AMBULATORY_CARE_PROVIDER_SITE_OTHER): Payer: Self-pay | Admitting: General Surgery

## 2013-02-14 NOTE — Telephone Encounter (Signed)
Patient calling to get an order sent to [employer] for bras and prosthesis. Order completed and in Dr Eliberto Ivory box for signature. Patient aware that Dr Magnus Ivan not in office until next week to sign the order.

## 2013-02-28 ENCOUNTER — Telehealth (INDEPENDENT_AMBULATORY_CARE_PROVIDER_SITE_OTHER): Payer: Self-pay | Admitting: General Surgery

## 2013-02-28 NOTE — Telephone Encounter (Signed)
Called [employer] and spoke to Brandi Bates and she pulled up Brandi Bates chart and we went over what Brandi Bates needs. I told Brandi Bates that I will get Dr Donell Beers to sign the form for me and I will fax it back to them. I called Brandi Bates and told her what I was doing and she stated that she needed bra, and silicone breast prosthesis and I filled all that out and I told her to call me if she needed anything else

## 2013-03-17 ENCOUNTER — Other Ambulatory Visit: Payer: Self-pay | Admitting: Physician Assistant

## 2013-03-17 DIAGNOSIS — C50911 Malignant neoplasm of unspecified site of right female breast: Secondary | ICD-10-CM

## 2013-03-20 ENCOUNTER — Telehealth: Payer: Self-pay | Admitting: Oncology

## 2013-03-20 ENCOUNTER — Ambulatory Visit (HOSPITAL_BASED_OUTPATIENT_CLINIC_OR_DEPARTMENT_OTHER): Payer: 59 | Admitting: Physician Assistant

## 2013-03-20 ENCOUNTER — Other Ambulatory Visit (HOSPITAL_BASED_OUTPATIENT_CLINIC_OR_DEPARTMENT_OTHER): Payer: 59 | Admitting: Lab

## 2013-03-20 ENCOUNTER — Encounter: Payer: Self-pay | Admitting: Physician Assistant

## 2013-03-20 VITALS — BP 146/76 | HR 74 | Temp 98.3°F | Resp 20 | Ht 64.0 in | Wt 174.4 lb

## 2013-03-20 DIAGNOSIS — Z86711 Personal history of pulmonary embolism: Secondary | ICD-10-CM

## 2013-03-20 DIAGNOSIS — C50419 Malignant neoplasm of upper-outer quadrant of unspecified female breast: Secondary | ICD-10-CM

## 2013-03-20 DIAGNOSIS — K760 Fatty (change of) liver, not elsewhere classified: Secondary | ICD-10-CM

## 2013-03-20 DIAGNOSIS — C50911 Malignant neoplasm of unspecified site of right female breast: Secondary | ICD-10-CM

## 2013-03-20 DIAGNOSIS — C50412 Malignant neoplasm of upper-outer quadrant of left female breast: Secondary | ICD-10-CM

## 2013-03-20 DIAGNOSIS — N959 Unspecified menopausal and perimenopausal disorder: Secondary | ICD-10-CM

## 2013-03-20 DIAGNOSIS — E876 Hypokalemia: Secondary | ICD-10-CM | POA: Insufficient documentation

## 2013-03-20 DIAGNOSIS — F419 Anxiety disorder, unspecified: Secondary | ICD-10-CM | POA: Insufficient documentation

## 2013-03-20 DIAGNOSIS — F411 Generalized anxiety disorder: Secondary | ICD-10-CM

## 2013-03-20 DIAGNOSIS — Z170 Estrogen receptor positive status [ER+]: Secondary | ICD-10-CM

## 2013-03-20 DIAGNOSIS — I2699 Other pulmonary embolism without acute cor pulmonale: Secondary | ICD-10-CM

## 2013-03-20 DIAGNOSIS — Z780 Asymptomatic menopausal state: Secondary | ICD-10-CM

## 2013-03-20 LAB — CBC WITH DIFFERENTIAL/PLATELET
BASO%: 0.3 % (ref 0.0–2.0)
HCT: 40.2 % (ref 34.8–46.6)
LYMPH%: 18.4 % (ref 14.0–49.7)
MCH: 30 pg (ref 25.1–34.0)
MCHC: 33.9 g/dL (ref 31.5–36.0)
MCV: 88.3 fL (ref 79.5–101.0)
MONO#: 0.3 10*3/uL (ref 0.1–0.9)
MONO%: 5.4 % (ref 0.0–14.0)
NEUT%: 74.2 % (ref 38.4–76.8)
Platelets: 178 10*3/uL (ref 145–400)
WBC: 4.7 10*3/uL (ref 3.9–10.3)

## 2013-03-20 LAB — COMPREHENSIVE METABOLIC PANEL (CC13)
ALT: 25 U/L (ref 0–55)
Alkaline Phosphatase: 88 U/L (ref 40–150)
CO2: 24 mEq/L (ref 22–29)
Creatinine: 0.8 mg/dL (ref 0.6–1.1)
Total Bilirubin: 0.5 mg/dL (ref 0.20–1.20)

## 2013-03-20 MED ORDER — LORAZEPAM 0.5 MG PO TABS: 0.5000 mg | ORAL_TABLET | Freq: Two times a day (BID) | ORAL | Status: DC | PRN

## 2013-03-20 NOTE — Progress Notes (Signed)
ID: Brandi Bates   DOB: 1955/09/03  MR#: [id number]  EGB#:[id number]  PCP: [name], MD   HISTORY OF PRESENT ILLNESS: The patient felt a mass in her left axilla several months ago.  She did not bring this to her physician's attention because she wasn't sure it really meant anything, but she asked her daughter, and her daughter asked a nurse fried, and they told her she better get on and have this evaluated, so she did bring it to [name] attention and he set her up for diagnostic mammography and ultrasonography performed at [facility] on September 22, 2010.  Dr. [name] was able to demonstrate a spiculated density at the 12 o'clock position of the breast with pleomorphic calcifications measuring about 4 cm.  There was a 2nd focal rounded area of increased density and with the axilla, there was an ovoid mass measuring up to 3.8 cm. Ultrasound showed the area of heterogeneous decreased echogenicity as well as the 2nd discrete focus.  The right breast showed only a cystic mass measuring 1 cm which was of no consequence.  Biopsy was performed of the left breast mass only on the same day.  The pathology report ([id number]) biopsied 2 masses in the left breast.  The second mass was "located more superiorly" and was also identified and biopsied.  The pathology report labels both masses "at 12 o'clock" so we will have to clarify this issue.  At any rate, both masses were Grade 3 invasive ductal carcinoma.  The first one was estrogen receptor positive at 61% but progesterone receptor negative with a proliferation marker of 95%.  The second one was ER positive at 98% and PR "positive" at 3%, also with a proliferation marker of 95%.  Both showed no HER2 amplification.  With this information, the patient was referred for breast MRI. This was performed September 30, 2010 and showed the main breast mass to measure 3.8 cm.  The posterior mass measured 6 millimeters. There were some confluent axillary masses to a  maximum measurement of 6.8 cm. The patient underwent definitive surgery 10/23/2011 with results and subsequent treatment as detailed below   INTERVAL HISTORY: Nona returns today for followup of her breast cancer. She continues on anastrozole daily. She is having significant hot flashes, both during the day and at night. She's also having some diffuse aches and pains, especially in the joints, and this sometimes affects her activity level. She admits that she is not exercising regularly.  Otherwise, [name] is under quite a bit of stress at home. Her son is currently living with her, and has some health issues. She is anxious today. She takes lorazepam appropriately as needed, and requests a refill today. She denies actual depression, and has no suicidal ideation.   REVIEW OF SYSTEMS:  Breasts has had no recent illnesses and denies fevers or chills. She's had no skin changes and denies any abnormal bruising or bleeding. Her energy level is low. Her appetite is good and she denies any nausea or change in bowel or bladder habits. She's had no cough, increased shortness of breath, or chest pain. She denies any abnormal headaches or dizziness. She's had no peripheral swelling.  A detailed review of systems is otherwise stable and noncontributory.    Past Medical History  Diagnosis Date  . Numbness of feet   . GERD (gastroesophageal reflux disease)   . Chronic low back pain     "everyday"  . Eczema   . Lymphedema of arm  left  . History of radiation therapy 04/23/11 thru 06/08/11    L breast  . Breast wound     Left breast from radiation, skin graft  . PONV (postoperative nausea and vomiting)   . Migraines   . Neuromuscular disorder     raynauds syndrome   . Shortness of breath on exertion   . Anxiety   . Breast cancer 10/23/10    s/p L mastectomy, chemo/radiation, er/pr +, Her2 -  . Breast cancer 11/10/11    S/P right mastectomy  . Allergy   . Hepatic steatosis 11/20/11    severe   .  Bladder cystocele 11/20/11    low-lying ct result  . Pulmonary thromboembolism 11/20/11    ct positive acute w/i segmental branches of right lower lobe  . Pneumonia April 2013  . History of cancer chemotherapy     "memory issues from chemo"  Significant for history of migraines, history of GERD, history of chronic constipation, history of chronic low back pain, history of tobacco abuse, the patient quitting in 1998 with approximately a 50 pack-year history prior to that.  The patient is status post tubal ligation, status post simple hysterectomy without salpingo-oophorectomy and status post cholecystectomy.  PAST SURGICAL HISTORY: Past Surgical History  Procedure Laterality Date  . Abdominal hysterectomy    . Irrigation and debridement abscess  06/16/2011    Procedure: IRRIGATION AND DEBRIDEMENT ABSCESS;  Surgeon: Harl Bowie, MD;  Location: [facility];  Service: General;  Laterality: Left;  incision and drainage of left chest wall abcess  . Bilateral salpingoophorectomy  11/10/11    laparoscopy  . Diagnostic laparoscopy    . Mastectomy  11/10/11    right; w/SNB  . Mastectomy modified radical  10/23/11    left  . Cholecystectomy  1999  . Tubal ligation  1979  . Mastectomy w/ sentinel node biopsy  11/10/2011    Procedure: MASTECTOMY WITH SENTINEL LYMPH NODE BIOPSY;  Surgeon: Harl Bowie, MD;  Location: [facility];  Service: General;  Laterality: Right;  . Portacath placement      right subclavian  . Breast surgery Bilateral     mastectomy    FAMILY HISTORY Family History  Problem Relation Age of Onset  . Cancer Mother     lung  . Hypertension Mother   . Cancer Father     lung  . Cancer Brother     BRAIN CANCER  . Hypertension Brother   . Cancer Cousin      2 PATERNAL COUSINS - BREAST CA  . Hypertension Sister   . Hypertension Maternal Uncle   . Hypertension Maternal Grandmother   The patient's father died at the age of 98 and the patient's mother at the age of 2.  Both  were smokers and both had lung cancer. The patient had one brother who died at age 34 with glioblastoma multiforme.  She has one surviving brother, one surviving sister who is present today, and a half-brother.  There are two cousins, both on the father's side, who had breast cancer in their 53s.  There is one uncle with Leverne Humbles disease.  GYNECOLOGIC HISTORY: She is GX P1.  First pregnancy to term at age 71.  She never took hormone replacement.    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  Her husband, [name], is a Geologist, engineering for an Deal[employer]  company.  Daughter Leveda Anna works at the same office as her mother.  Son Louie Casa works for [employer].  The patient has 7 grandchildren. She attends a [employer].     ADVANCED DIRECTIVES: in place  HEALTH MAINTENANCE: History  Substance Use Topics  . Smoking status: Former Smoker -- 2.00 packs/day for 28 years    Types: Cigarettes    Quit date: 05/31/1997  . Smokeless tobacco: Never Used  . Alcohol Use: No     Colonoscopy:  PAP: s/p hysterectomy  Bone density: SOLIS October 2012, T - 1.4 [facility]  Lipid panel:  Allergies  Allergen Reactions  . Morphine And Related Hives and Itching    All over the body  . Omeprazole Magnesium Nausea Only  . Gabapentin Other (See Comments)    Unable to sleep  . Latex Rash    Only where touched    Current Outpatient Prescriptions  Medication Sig Dispense Refill  . anastrozole (ARIMIDEX) 1 MG tablet Take 1 tablet (1 mg total) by mouth daily.  30 tablet  12  . aspirin 81 MG tablet Take 81 mg by mouth daily.      . B Complex Vitamins (B COMPLEX-B12) TABS Take 1 tablet by mouth daily.      . calcium carbonate (TUMS - DOSED IN MG ELEMENTAL CALCIUM) 500 MG chewable tablet Chew 1 tablet by mouth as needed.      . calcium-vitamin D 250-100 MG-UNIT per tablet Take 1 tablet by mouth daily.      . famotidine  (PEPCID AC) 10 MG chewable tablet Chew 10 mg by mouth daily.      Marland Kitchen LORazepam (ATIVAN) 0.5 MG tablet Take 1 tablet (0.5 mg total) by mouth 2 (two) times daily as needed for anxiety.  30 tablet  0  . LYSINE PO Take 1 tablet by mouth daily.       . ondansetron (ZOFRAN) 8 MG tablet Take 8 mg by mouth Every 12 hours as needed. For n/v      . polyethylene glycol (MIRALAX / GLYCOLAX) packet Take 17 g by mouth daily as needed. For constipation      . [DISCONTINUED] gabapentin (NEURONTIN) 300 MG capsule daily.       No current facility-administered medications for this visit.    OBJECTIVE:  Filed Vitals:   03/20/13 0920  BP: 146/76  Pulse: 74  Temp: 98.3 F (36.8 C)  Resp: 20     Body mass index is 29.92 kg/(m^2).    ECOG FS: 1 Filed Weights   03/20/13 0920  Weight: 174 lb 6.4 oz (79.107 kg)  Middle-aged white woman who appears anxious and tearful on presentation  Sclerae unicteric Oropharynx clear No cervical or supraclavicular  Lungs are clear to auscultation bilaterally, no rales or rhonchi Heart regular rate and rhythm, no murmur appreciated Abd soft, nontender to palpation, positive bowel soundsomen  MSK no focal spinal tenderness, no peripheral edema;  Neuro: nonfocal, well oriented,  anxious  affect Breasts: Status post bilateral mastectomies.  Axillae are benign bilaterally with no palpable adenopathy.  There is no evidence of local recurrence.    LAB RESULTS: Lab Results  Component Value Date   WBC 4.7 03/20/2013   NEUTROABS 3.5 03/20/2013   HGB 13.6 03/20/2013   HCT 40.2 03/20/2013   MCV 88.3 03/20/2013   PLT 178 03/20/2013      Chemistry      Component Value Date/Time   NA 147* 03/20/2013 0831   NA 142 12/25/2011  1005   K 3.4* 03/20/2013 0831   K 3.9 12/25/2011 1005   CL 106 10/05/2012 1234   CL 106 12/25/2011 1005   CO2 24 03/20/2013 0831   CO2 21 12/25/2011 1005   BUN 14.7 03/20/2013 0831   BUN 11 12/25/2011 1005   CREATININE 0.8 03/20/2013 0831   CREATININE 0.65  12/25/2011 1005      Component Value Date/Time   CALCIUM 9.5 03/20/2013 0831   CALCIUM 9.7 12/25/2011 1005   ALKPHOS 88 03/20/2013 0831   ALKPHOS 65 12/25/2011 1005   AST 17 03/20/2013 0831   AST 20 12/25/2011 1005   ALT 25 03/20/2013 0831   ALT 30 12/25/2011 1005   BILITOT 0.50 03/20/2013 0831   BILITOT 0.3 12/25/2011 1005        STUDIES:  Chest x-ray on 10/26/2012 was unremarkable.    ASSESSMENT:56 y.o.  [place] woman with a BRCA mutation of unknown clinical significance  (1) status post left modified radical mastectomy March of 2012 for a T3 N3a (stage IIIC)  invasive ductal carcinoma, grade 3,  which was strongly estrogen receptor positive, progesterone receptor and HER2 negative, with an MIB-1 of 95%.   (2) s/p 4 cycles of adjuvant dose dense doxorubicin and cyclophosphamide, and 11 doses of weekly paclitaxel,   (3) s/p radiation to the left chest and regional nodes completed November 2012, at which time she started tamoxifen  (4) s/p right mastectomy with sentinel lymph node sampling 11/10/2011 for a pT1b pN0, stage IA invasive ductal carcinoma, grade 1, with insufficient tumor for estrogen and progesterone testing, but HER-2 negative.  (5) anastrozole started May 2013, interrupted June 2013, resumed July 2013  (6) post-op pulmonary embolus documented 11/20/2011, completed over 6 months of Coumadin anticoagulation  PLAN:  Catlynn appears to be doing well with regards to her breast cancer, with no clinical evidence of disease recurrence.  We discussed holding the anastrozole for 4-6 weeks to see if this helps with the joint pain, but Tiarra tells me she "can't as tolerated" and would prefer not to make any changes. We also discussed trying a low-dose venlafaxine for the hot flashes which she also declines at this time.  The  plan is to continue anastrozole for total of 5 years. She  is due for a bone density which will be scheduled at the next available appointment. also encouraged  her to continue on calcium and vitamin D, and to walk daily, not only for general health, but also to improve bone density.    Laurian continues to have a difficult time financially with all of the medical bills. She tells me she simply cannot afford to have her port removed at this time, but I did encourage her to come in every 6-8 weeks if at all possible for port flushes. We are scheduling these for her accordingly.  Otherwise she will see [place] again in 6 months for repeat labs and physical exam. Ladoris voices her understanding and agreement with our plan.  She knows to call for any problems that may develop before the next visit.   Romelo Sciandra    03/20/2013

## 2013-03-20 NOTE — Telephone Encounter (Signed)
, °

## 2013-04-05 ENCOUNTER — Other Ambulatory Visit: Payer: BC Managed Care – PPO | Admitting: Lab

## 2013-04-05 ENCOUNTER — Ambulatory Visit: Payer: BC Managed Care – PPO | Admitting: Physician Assistant

## 2013-04-11 ENCOUNTER — Telehealth: Payer: Self-pay | Admitting: *Deleted

## 2013-04-11 NOTE — Telephone Encounter (Signed)
Pt called to cancel her flush for 04/12/13.Marland Kitchentd

## 2013-05-24 ENCOUNTER — Ambulatory Visit (HOSPITAL_BASED_OUTPATIENT_CLINIC_OR_DEPARTMENT_OTHER): Payer: 59

## 2013-05-24 VITALS — BP 156/69 | HR 78 | Temp 98.4°F | Resp 18

## 2013-05-24 DIAGNOSIS — C50919 Malignant neoplasm of unspecified site of unspecified female breast: Secondary | ICD-10-CM

## 2013-05-24 DIAGNOSIS — C50419 Malignant neoplasm of upper-outer quadrant of unspecified female breast: Secondary | ICD-10-CM

## 2013-05-24 DIAGNOSIS — Z452 Encounter for adjustment and management of vascular access device: Secondary | ICD-10-CM

## 2013-05-24 MED ORDER — HEPARIN SOD (PORK) LOCK FLUSH 100 UNIT/ML IV SOLN
500.0000 [IU] | Freq: Once | INTRAVENOUS | Status: AC
  Administered 2013-05-24: 500 [IU] via INTRAVENOUS
  Filled 2013-05-24: qty 5

## 2013-05-24 MED ORDER — SODIUM CHLORIDE 0.9 % IJ SOLN
10.0000 mL | INTRAMUSCULAR | Status: DC | PRN
  Administered 2013-05-24: 10 mL via INTRAVENOUS
  Filled 2013-05-24: qty 10

## 2013-05-24 NOTE — Patient Instructions (Signed)
Implanted Port Instructions  An implanted port is a central line that has a round shape and is placed under the skin. It is used for long-term IV (intravenous) access for:  · Medicine.  · Fluids.  · Liquid nutrition, such as TPN (total parenteral nutrition).  · Blood samples.  Ports can be placed:  · In the chest area just below the collarbone (this is the most common place.)  · In the arms.  · In the belly (abdomen) area.  · In the legs.  PARTS OF THE PORT  A port has 2 main parts:  · The reservoir. The reservoir is round, disc-shaped, and will be a small, raised area under your skin.  · The reservoir is the part where a needle is inserted (accessed) to either give medicines or to draw blood.  · The catheter. The catheter is a long, slender tube that extends from the reservoir. The catheter is placed into a large vein.  · Medicine that is inserted into the reservoir goes into the catheter and then into the vein.  INSERTION OF THE PORT  · The port is surgically placed in either an operating room or in a procedural area (interventional radiology).  · Medicine may be given to help you relax during the procedure.  · The skin where the port will be inserted is numbed (local anesthetic).  · 1 or 2 small cuts (incisions) will be made in the skin to insert the port.  · The port can be used after it has been inserted.  INCISION SITE CARE  · The incision site may have small adhesive strips on it. This helps keep the incision site closed. Sometimes, no adhesive strips are placed. Instead of adhesive strips, a special kind of surgical glue is used to keep the incision closed.  · If adhesive strips were placed on the incision sites, do not take them off. They will fall off on their own.  · The incision site may be sore for 1 to 2 days. Pain medicine can help.  · Do not get the incision site wet. Bathe or shower as directed by your caregiver.  · The incision site should heal in 5 to 7 days. A small scar may form after the  incision has healed.  ACCESSING THE PORT  Special steps must be taken to access the port:  · Before the port is accessed, a numbing cream can be placed on the skin. This helps numb the skin over the port site.  · A sterile technique is used to access the port.  · The port is accessed with a needle. Only "non-coring" port needles should be used to access the port. Once the port is accessed, a blood return should be checked. This helps ensure the port is in the vein and is not clogged (clotted).  · If your caregiver believes your port should remain accessed, a clear (transparent) bandage will be placed over the needle site. The bandage and needle will need to be changed every week or as directed by your caregiver.  · Keep the bandage covering the needle clean and dry. Do not get it wet. Follow your caregiver's instructions on how to take a shower or bath when the port is accessed.  · If your port does not need to stay accessed, no bandage is needed over the port.  FLUSHING THE PORT  Flushing the port keeps it from getting clogged. How often the port is flushed depends on:  · If a   constant infusion is running. If a constant infusion is running, the port may not need to be flushed.  · If intermittent medicines are given.  · If the port is not being used.  For intermittent medicines:  · The port will need to be flushed:  · After medicines have been given.  · After blood has been drawn.  · As part of routine maintenance.  · A port is normally flushed with:  · Normal saline.  · Heparin.  · Follow your caregiver's advice on how often, how much, and the type of flush to use on your port.  IMPORTANT PORT INFORMATION  · Tell your caregiver if you are allergic to heparin.  · After your port is placed, you will get a manufacturer's information card. The card has information about your port. Keep this card with you at all times.  · There are many types of ports available. Know what kind of port you have.  · In case of an  emergency, it may be helpful to wear a medical alert bracelet. This can help alert health care workers that you have a port.  · The port can stay in for as long as your caregiver believes it is necessary.  · When it is time for the port to come out, surgery will be done to remove it. The surgery will be similar to how the port was put in.  · If you are in the hospital or clinic:  · Your port will be taken care of and flushed by a nurse.  · If you are at home:  · A home health care nurse may give medicines and take care of the port.  · You or a family member can get special training and directions for giving medicine and taking care of the port at home.  SEEK IMMEDIATE MEDICAL CARE IF:   · Your port does not flush or you are unable to get a blood return.  · New drainage or pus is coming from the incision.  · A bad smell is coming from the incision site.  · You develop swelling or increased redness at the incision site.  · You develop increased swelling or pain at the port site.  · You develop swelling or pain in the surrounding skin near the port.  · You have an oral temperature above 102° F (38.9° C), not controlled by medicine.  MAKE SURE YOU:   · Understand these instructions.  · Will watch your condition.  · Will get help right away if you are not doing well or get worse.  Document Released: 07/13/2005 Document Revised: 10/05/2011 Document Reviewed: 10/04/2008  ExitCare® Patient Information ©2014 [employer].

## 2013-06-01 ENCOUNTER — Other Ambulatory Visit: Payer: Self-pay

## 2013-08-09 ENCOUNTER — Telehealth: Payer: Self-pay | Admitting: Oncology

## 2013-08-09 NOTE — Telephone Encounter (Signed)
, °

## 2013-09-25 ENCOUNTER — Telehealth: Payer: Self-pay | Admitting: *Deleted

## 2013-09-25 NOTE — Telephone Encounter (Signed)
Pt called to cancel her appt for 09/27/13...td

## 2013-09-27 ENCOUNTER — Other Ambulatory Visit: Payer: 59

## 2013-10-02 ENCOUNTER — Telehealth: Payer: Self-pay | Admitting: *Deleted

## 2013-10-02 NOTE — Telephone Encounter (Signed)
Return pt called gv a lab appt for 10/03/13@ 8:15am per pt request...td

## 2013-10-03 ENCOUNTER — Other Ambulatory Visit (HOSPITAL_BASED_OUTPATIENT_CLINIC_OR_DEPARTMENT_OTHER): Payer: 59

## 2013-10-03 DIAGNOSIS — C50419 Malignant neoplasm of upper-outer quadrant of unspecified female breast: Secondary | ICD-10-CM

## 2013-10-03 DIAGNOSIS — C50911 Malignant neoplasm of unspecified site of right female breast: Secondary | ICD-10-CM

## 2013-10-03 DIAGNOSIS — C50412 Malignant neoplasm of upper-outer quadrant of left female breast: Secondary | ICD-10-CM

## 2013-10-03 LAB — CBC WITH DIFFERENTIAL/PLATELET
BASO%: 0.3 % (ref 0.0–2.0)
BASOS ABS: 0 10*3/uL (ref 0.0–0.1)
EOS%: 2.5 % (ref 0.0–7.0)
Eosinophils Absolute: 0.1 10*3/uL (ref 0.0–0.5)
HCT: 38.4 % (ref 34.8–46.6)
HEMOGLOBIN: 12.9 g/dL (ref 11.6–15.9)
LYMPH#: 1.1 10*3/uL (ref 0.9–3.3)
LYMPH%: 22.1 % (ref 14.0–49.7)
MCH: 29.5 pg (ref 25.1–34.0)
MCHC: 33.5 g/dL (ref 31.5–36.0)
MCV: 87.9 fL (ref 79.5–101.0)
MONO#: 0.3 10*3/uL (ref 0.1–0.9)
MONO%: 6.1 % (ref 0.0–14.0)
NEUT#: 3.4 10*3/uL (ref 1.5–6.5)
NEUT%: 69 % (ref 38.4–76.8)
Platelets: 180 10*3/uL (ref 145–400)
RBC: 4.36 10*6/uL (ref 3.70–5.45)
RDW: 14.6 % — AB (ref 11.2–14.5)
WBC: 4.9 10*3/uL (ref 3.9–10.3)

## 2013-10-03 LAB — COMPREHENSIVE METABOLIC PANEL (CC13)
ALBUMIN: 3.8 g/dL (ref 3.5–5.0)
ALK PHOS: 89 U/L (ref 40–150)
ALT: 29 U/L (ref 0–55)
AST: 19 U/L (ref 5–34)
Anion Gap: 10 mEq/L (ref 3–11)
BUN: 12 mg/dL (ref 7.0–26.0)
CALCIUM: 9.7 mg/dL (ref 8.4–10.4)
CHLORIDE: 110 meq/L — AB (ref 98–109)
CO2: 26 mEq/L (ref 22–29)
Creatinine: 0.7 mg/dL (ref 0.6–1.1)
Glucose: 104 mg/dl (ref 70–140)
POTASSIUM: 3.6 meq/L (ref 3.5–5.1)
SODIUM: 145 meq/L (ref 136–145)
TOTAL PROTEIN: 6.8 g/dL (ref 6.4–8.3)
Total Bilirubin: 0.43 mg/dL (ref 0.20–1.20)

## 2013-10-05 ENCOUNTER — Ambulatory Visit (HOSPITAL_BASED_OUTPATIENT_CLINIC_OR_DEPARTMENT_OTHER): Payer: 59 | Admitting: Oncology

## 2013-10-05 ENCOUNTER — Telehealth: Payer: Self-pay | Admitting: *Deleted

## 2013-10-05 VITALS — BP 135/77 | HR 89 | Temp 98.5°F | Resp 20 | Ht 64.0 in | Wt 180.7 lb

## 2013-10-05 DIAGNOSIS — Z170 Estrogen receptor positive status [ER+]: Secondary | ICD-10-CM

## 2013-10-05 DIAGNOSIS — Z853 Personal history of malignant neoplasm of breast: Secondary | ICD-10-CM

## 2013-10-05 DIAGNOSIS — C50919 Malignant neoplasm of unspecified site of unspecified female breast: Secondary | ICD-10-CM

## 2013-10-05 DIAGNOSIS — I2699 Other pulmonary embolism without acute cor pulmonale: Secondary | ICD-10-CM

## 2013-10-05 DIAGNOSIS — Z9010 Acquired absence of unspecified breast and nipple: Secondary | ICD-10-CM

## 2013-10-05 DIAGNOSIS — D72829 Elevated white blood cell count, unspecified: Secondary | ICD-10-CM

## 2013-10-05 DIAGNOSIS — C50911 Malignant neoplasm of unspecified site of right female breast: Secondary | ICD-10-CM

## 2013-10-05 MED ORDER — ANASTROZOLE 1 MG PO TABS: 1.0000 mg | ORAL_TABLET | Freq: Every day | ORAL | Status: DC

## 2013-10-05 NOTE — Telephone Encounter (Signed)
appts made and printed...td 

## 2013-10-05 NOTE — Progress Notes (Signed)
ID: Brandi Bates   DOB: November 11, 1955  MR#: [id number]  CSN#:[id number]  PCP: [name], MD   HISTORY OF PRESENT ILLNESS: The patient felt a mass in her left axilla several months ago.  She did not bring this to her physician's attention because she wasn't sure it really meant anything, but she asked her daughter, and her daughter asked a nurse fried, and they told her she better get on and have this evaluated, so she did bring it to [name] attention and he set her up for diagnostic mammography and ultrasonography performed at [facility] on September 22, 2010.  Dr. [name] was able to demonstrate a spiculated density at the 12 o'clock position of the breast with pleomorphic calcifications measuring about 4 cm.  There was a 2nd focal rounded area of increased density and with the axilla, there was an ovoid mass measuring up to 3.8 cm. Ultrasound showed the area of heterogeneous decreased echogenicity as well as the 2nd discrete focus.  The right breast showed only a cystic mass measuring 1 cm which was of no consequence.  Biopsy was performed of the left breast mass only on the same day.  The pathology report ([id number]) biopsied 2 masses in the left breast.  The second mass was "located more superiorly" and was also identified and biopsied.  The pathology report labels both masses "at 12 o'clock" so we will have to clarify this issue.  At any rate, both masses were Grade 3 invasive ductal carcinoma.  The first one was estrogen receptor positive at 61% but progesterone receptor negative with a proliferation marker of 95%.  The second one was ER positive at 98% and PR "positive" at 3%, also with a proliferation marker of 95%.  Both showed no HER2 amplification.  With this information, the patient was referred for breast MRI. This was performed September 30, 2010 and showed the main breast mass to measure 3.8 cm.  The posterior mass measured 6 millimeters. There were some confluent axillary masses to a  maximum measurement of 6.8 cm. The patient underwent definitive surgery 10/23/2011 with results and subsequent treatment as detailed below   INTERVAL HISTORY: Brandi Bates returns today for followup of her breast cancer. The interval history is generally unremarkable. Her husband is having trouble with his feet so he is not walking and so she is not walking either.  REVIEW OF SYSTEMS:  She is concerned about weight gain. She admits that she could exercise more. Diet is fair. She does not have significant problems with hot flashes but does have some vaginal dryness from the anastrozole. She also has diffuse aches and pains. We tried stopping it in the past and she says the pain started to get better so we discussed that again today. Aside from these issues a detailed review of systems today was noncontributory   Past Medical History  Diagnosis Date  . Numbness of feet   . GERD (gastroesophageal reflux disease)   . Chronic low back pain     "everyday"  . Eczema   . Lymphedema of arm     left  . History of radiation therapy 04/23/11 thru 06/08/11    L breast  . Breast wound     Left breast from radiation, skin graft  . PONV (postoperative nausea and vomiting)   . Migraines   . Neuromuscular disorder     raynauds syndrome   . Shortness of breath on exertion   . Anxiety   . Breast cancer 10/23/10  s/p L mastectomy, chemo/radiation, er/pr +, Her2 -  . Breast cancer 11/10/11    S/P right mastectomy  . Allergy   . Hepatic steatosis 11/20/11    severe   . Bladder cystocele 11/20/11    low-lying ct result  . Pulmonary thromboembolism 11/20/11    ct positive acute w/i segmental branches of right lower lobe  . Pneumonia April 2013  . History of cancer chemotherapy     "memory issues from chemo"  Significant for history of migraines, history of GERD, history of chronic constipation, history of chronic low back pain, history of tobacco abuse, the patient quitting in 1998 with approximately a 50  pack-year history prior to that.  The patient is status post tubal ligation, status post simple hysterectomy without salpingo-oophorectomy and status post cholecystectomy.  PAST SURGICAL HISTORY: Past Surgical History  Procedure Laterality Date  . Abdominal hysterectomy    . Irrigation and debridement abscess  06/16/2011    Procedure: IRRIGATION AND DEBRIDEMENT ABSCESS;  Surgeon: Harl Bowie, MD;  Location: [facility];  Service: General;  Laterality: Left;  incision and drainage of left chest wall abcess  . Bilateral salpingoophorectomy  11/10/11    laparoscopy  . Diagnostic laparoscopy    . Mastectomy  11/10/11    right; w/SNB  . Mastectomy modified radical  10/23/11    left  . Cholecystectomy  1999  . Tubal ligation  1979  . Mastectomy w/ sentinel node biopsy  11/10/2011    Procedure: MASTECTOMY WITH SENTINEL LYMPH NODE BIOPSY;  Surgeon: Harl Bowie, MD;  Location: [facility];  Service: General;  Laterality: Right;  . Portacath placement      right subclavian  . Breast surgery Bilateral     mastectomy    FAMILY HISTORY Family History  Problem Relation Age of Onset  . Cancer Mother     lung  . Hypertension Mother   . Cancer Father     lung  . Cancer Brother     BRAIN CANCER  . Hypertension Brother   . Cancer Cousin      2 PATERNAL COUSINS - BREAST CA  . Hypertension Sister   . Hypertension Maternal Uncle   . Hypertension Maternal Grandmother   The patient's father died at the age of 3 and the patient's mother at the age of 51.  Both were smokers and both had lung cancer. The patient had one brother who died at age 44 with glioblastoma multiforme.  She has one surviving brother, one surviving sister who is present today, and a half-brother.  There are two cousins, both on the father's side, who had breast cancer in their 58s.  There is one uncle with Leverne Humbles disease.  GYNECOLOGIC HISTORY: She is GX P1.  First pregnancy to term at age 55.  She never took hormone  replacement.    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  Her husband, [name], is a Geologist, engineering for an [employer].  Daughter Leveda Anna works at the same office as her mother.  Son Louie Casa works for [employer].  The patient has 7 grandchildren. She attends a [employer].     ADVANCED DIRECTIVES: in place  HEALTH MAINTENANCE: History  Substance Use Topics  . Smoking status: Former Smoker -- 2.00 packs/day for 28 years    Types: Cigarettes    Quit date: 05/31/1997  . Smokeless  tobacco: Never Used  . Alcohol Use: No     Colonoscopy:  PAP: s/p hysterectomy  Bone density: SOLIS October 2012, T - 1.4 at [facility]  Lipid panel:  Allergies  Allergen Reactions  . Morphine And Related Hives and Itching    All over the body  . Omeprazole Magnesium Nausea Only  . Gabapentin Other (See Comments)    Unable to sleep  . Latex Rash    Only where touched    Current Outpatient Prescriptions  Medication Sig Dispense Refill  . anastrozole (ARIMIDEX) 1 MG tablet Take 1 tablet (1 mg total) by mouth daily.  30 tablet  12  . aspirin 81 MG tablet Take 81 mg by mouth daily.      . B Complex Vitamins (B COMPLEX-B12) TABS Take 1 tablet by mouth daily.      . calcium carbonate (TUMS - DOSED IN MG ELEMENTAL CALCIUM) 500 MG chewable tablet Chew 1 tablet by mouth as needed.      . calcium-vitamin D 250-100 MG-UNIT per tablet Take 1 tablet by mouth daily.      . famotidine (PEPCID AC) 10 MG chewable tablet Chew 10 mg by mouth daily.      Marland Kitchen LORazepam (ATIVAN) 0.5 MG tablet Take 1 tablet (0.5 mg total) by mouth 2 (two) times daily as needed for anxiety.  30 tablet  0  . LYSINE PO Take 1 tablet by mouth daily.       . ondansetron (ZOFRAN) 8 MG tablet Take 8 mg by mouth Every 12 hours as needed. For n/v      . polyethylene glycol (MIRALAX / GLYCOLAX) packet Take 17 g by mouth daily as needed. For constipation       . [DISCONTINUED] gabapentin (NEURONTIN) 300 MG capsule daily.       No current facility-administered medications for this visit.    OBJECTIVE:  Filed Vitals:   10/05/13 0907  BP: 135/77  Pulse: 89  Temp: 98.5 F (36.9 C)  Resp: 20     Body mass index is 31 kg/(m^2).    ECOG FS: 1 Filed Weights   10/05/13 0907  Weight: 180 lb 11.2 oz (81.965 kg)   Middle-aged white woman in no acute distress  Sclerae unicteric, pupils round and equal Oropharynx clear and moist No cervical or supraclavicular  Lungs are clear to auscultation, good excursion bilaterally Heart regular rate and rhythm, no murmur appreciated Abd soft, obese, nontender to palpation, positive bowel sounds MSK no focal spinal tenderness, no upper extremity lymphedema  Neuro: nonfocal, well oriented,  pleasant  affect Breasts: Status post bilateral mastectomies.  Axillae are benign bilaterally. There is no evidence of local recurrence.    LAB RESULTS: Lab Results  Component Value Date   WBC 4.9 10/03/2013   NEUTROABS 3.4 10/03/2013   HGB 12.9 10/03/2013   HCT 38.4 10/03/2013   MCV 87.9 10/03/2013   PLT 180 10/03/2013      Chemistry      Component Value Date/Time   NA 145 10/03/2013 0835   NA 142 12/25/2011 1005   K 3.6 10/03/2013 0835   K 3.9 12/25/2011 1005   CL 106 10/05/2012 1234   CL 106 12/25/2011 1005   CO2 26 10/03/2013 0835   CO2 21 12/25/2011 1005   BUN 12.0 10/03/2013 0835   BUN 11 12/25/2011 1005   CREATININE 0.7 10/03/2013 0835   CREATININE 0.65 12/25/2011 1005      Component Value Date/Time   CALCIUM 9.7 10/03/2013  6438   CALCIUM 9.7 12/25/2011 1005   ALKPHOS 89 10/03/2013 0835   ALKPHOS 65 12/25/2011 1005   AST 19 10/03/2013 0835   AST 20 12/25/2011 1005   ALT 29 10/03/2013 0835   ALT 30 12/25/2011 1005   BILITOT 0.43 10/03/2013 0835   BILITOT 0.3 12/25/2011 1005        STUDIES:  No results found.  ASSESSMENT:57 y.o.  [place] woman with a BRCA mutation of unknown clinical significance  (1)  status post left modified radical mastectomy March of 2012 for a T3 N3a (stage IIIC)  invasive ductal carcinoma, grade 3,  which was strongly estrogen receptor positive, progesterone receptor and HER2 negative, with an MIB-1 of 95%.   (2) s/p 4 cycles of adjuvant dose dense doxorubicin and cyclophosphamide, and 11 doses of weekly paclitaxel,   (3) s/p radiation to the left chest and regional nodes completed November 2012, at which time she started tamoxifen  (4) s/p right mastectomy with sentinel lymph node sampling 11/10/2011 for a pT1b pN0, stage IA invasive ductal carcinoma, grade 1, with insufficient tumor for estrogen and progesterone testing, but HER-2 negative.  (5) anastrozole started May 2013, interrupted June 2013, resumed July 2013  (6) post-op pulmonary embolus documented 11/20/2011, completed over 6 months of Coumadin anticoagulation  PLAN:  Brandi Bates is doing well from a breast cancer point of view, with no evidence of disease recurrence at present. She is developing a benign essential tremor: I reassured her regarding that. I gave her information on R. new "pelvic health" program for the vaginal dryness issue.  We again discussed taking a 2 month break from anastrozole. She just wants to "get it over with". It is to that if we switched to a different pill such as exemestane very likely the same symptoms would occur. I also suggested she try our Tai chi/yoga program for her arthritis. She is going to consider that.  Otherwise she will see [place] again in 6 months. She knows to call for any problems that may develop before her next visit. The plan is to continue anastrozole until July of 2018.   MAGRINAT,GUSTAV C    10/05/2013

## 2013-12-04 ENCOUNTER — Telehealth: Payer: Self-pay | Admitting: *Deleted

## 2013-12-04 DIAGNOSIS — I890 Lymphedema, not elsewhere classified: Secondary | ICD-10-CM

## 2013-12-04 DIAGNOSIS — C50419 Malignant neoplasm of upper-outer quadrant of unspecified female breast: Secondary | ICD-10-CM

## 2013-12-04 DIAGNOSIS — Z9010 Acquired absence of unspecified breast and nipple: Secondary | ICD-10-CM

## 2013-12-04 NOTE — Telephone Encounter (Signed)
Brandi Bates left message stating onset of swelling in her LEFT hand now going into the arm-  " it is the side they took out all those lymph nodes ".  Per discussion area of swelling is nonpitting with pressure " bounces back like a sponge"-  Brandi Bates was performing home massages but due to family illnesses was not as diligent-  She has compression sleeve and gauntlet " but now if I put it on it seems to make the swelling worse ".  Pt has not been seen at the lymphedema clinic " oh it's been a long time "  Plan per phone conversation is for a referral to the lymphedema clinic to be made.  If pt needs work excuse per above for FMLA this office can provide for her.

## 2013-12-06 ENCOUNTER — Ambulatory Visit: Payer: 59 | Attending: Oncology | Admitting: Physical Therapy

## 2013-12-06 DIAGNOSIS — IMO0001 Reserved for inherently not codable concepts without codable children: Secondary | ICD-10-CM | POA: Insufficient documentation

## 2013-12-06 DIAGNOSIS — M24519 Contracture, unspecified shoulder: Secondary | ICD-10-CM | POA: Insufficient documentation

## 2013-12-06 DIAGNOSIS — I890 Lymphedema, not elsewhere classified: Secondary | ICD-10-CM | POA: Insufficient documentation

## 2013-12-13 ENCOUNTER — Ambulatory Visit: Payer: 59 | Admitting: Physical Therapy

## 2013-12-20 ENCOUNTER — Ambulatory Visit: Payer: 59 | Admitting: Physical Therapy

## 2013-12-27 ENCOUNTER — Ambulatory Visit: Payer: 59 | Attending: Oncology

## 2013-12-27 DIAGNOSIS — I890 Lymphedema, not elsewhere classified: Secondary | ICD-10-CM | POA: Insufficient documentation

## 2013-12-27 DIAGNOSIS — M24519 Contracture, unspecified shoulder: Secondary | ICD-10-CM | POA: Diagnosis not present

## 2013-12-27 DIAGNOSIS — IMO0001 Reserved for inherently not codable concepts without codable children: Secondary | ICD-10-CM | POA: Diagnosis not present

## 2014-01-03 ENCOUNTER — Ambulatory Visit: Payer: 59 | Admitting: Physical Therapy

## 2014-01-03 DIAGNOSIS — IMO0001 Reserved for inherently not codable concepts without codable children: Secondary | ICD-10-CM | POA: Diagnosis not present

## 2014-01-10 ENCOUNTER — Ambulatory Visit: Payer: 59 | Admitting: Physical Therapy

## 2014-01-10 DIAGNOSIS — IMO0001 Reserved for inherently not codable concepts without codable children: Secondary | ICD-10-CM | POA: Diagnosis not present

## 2014-01-29 ENCOUNTER — Telehealth: Payer: Self-pay

## 2014-01-29 NOTE — Telephone Encounter (Signed)
Rcvd fax from [facility] outpatient rehab - discharge summary.  Sent to scan.

## 2014-01-29 NOTE — Telephone Encounter (Signed)
Faxed order for electrode supply kit to [facility].  Sent to scan.

## 2014-01-29 NOTE — Telephone Encounter (Signed)
Faxed order for compression item to [facility].  Sent to scan.

## 2014-01-29 NOTE — Telephone Encounter (Signed)
Faxed order for compression items to [facility].  Sent to scan.

## 2014-01-29 NOTE — Telephone Encounter (Signed)
Sent order for compression items to [facility].  Sent to scan.  3rd request

## 2014-04-06 ENCOUNTER — Telehealth: Payer: Self-pay | Admitting: Nurse Practitioner

## 2014-04-06 NOTE — Telephone Encounter (Signed)
returned pt VM-pt wanted to CX both appts-lab & MD-stated would call & r/s

## 2014-04-10 ENCOUNTER — Other Ambulatory Visit: Payer: 59

## 2014-04-18 ENCOUNTER — Ambulatory Visit: Payer: 59 | Admitting: Nurse Practitioner

## 2014-05-28 ENCOUNTER — Encounter: Payer: Self-pay | Admitting: Physician Assistant

## 2014-07-06 ENCOUNTER — Telehealth: Payer: Self-pay | Admitting: Oncology

## 2014-07-06 NOTE — Telephone Encounter (Signed)
pt cld & left vm wanting appt w/GM-cld 7 spoke to pt & adv pt GM ist avail is 09/2014-sch w/HF gave pt time & date-pt understood

## 2014-07-07 NOTE — Telephone Encounter (Signed)
No entry 

## 2014-07-11 ENCOUNTER — Other Ambulatory Visit (INDEPENDENT_AMBULATORY_CARE_PROVIDER_SITE_OTHER): Payer: Self-pay | Admitting: Physician Assistant

## 2014-07-31 ENCOUNTER — Encounter: Payer: Self-pay | Admitting: Nurse Practitioner

## 2014-07-31 ENCOUNTER — Telehealth: Payer: Self-pay | Admitting: Nurse Practitioner

## 2014-07-31 ENCOUNTER — Other Ambulatory Visit (HOSPITAL_BASED_OUTPATIENT_CLINIC_OR_DEPARTMENT_OTHER): Payer: 59

## 2014-07-31 ENCOUNTER — Ambulatory Visit (HOSPITAL_BASED_OUTPATIENT_CLINIC_OR_DEPARTMENT_OTHER): Payer: 59 | Admitting: Nurse Practitioner

## 2014-07-31 ENCOUNTER — Other Ambulatory Visit: Payer: Self-pay | Admitting: *Deleted

## 2014-07-31 VITALS — BP 149/69 | HR 67 | Temp 98.3°F | Resp 18 | Ht 64.0 in | Wt 178.5 lb

## 2014-07-31 DIAGNOSIS — C50911 Malignant neoplasm of unspecified site of right female breast: Secondary | ICD-10-CM

## 2014-07-31 DIAGNOSIS — Z853 Personal history of malignant neoplasm of breast: Secondary | ICD-10-CM

## 2014-07-31 DIAGNOSIS — C50419 Malignant neoplasm of upper-outer quadrant of unspecified female breast: Secondary | ICD-10-CM

## 2014-07-31 DIAGNOSIS — Z79810 Long term (current) use of selective estrogen receptor modulators (SERMs): Secondary | ICD-10-CM

## 2014-07-31 LAB — COMPREHENSIVE METABOLIC PANEL (CC13)
ALBUMIN: 4.1 g/dL (ref 3.5–5.0)
ALT: 31 U/L (ref 0–55)
ANION GAP: 9 meq/L (ref 3–11)
AST: 22 U/L (ref 5–34)
Alkaline Phosphatase: 70 U/L (ref 40–150)
BUN: 16.5 mg/dL (ref 7.0–26.0)
CALCIUM: 9.8 mg/dL (ref 8.4–10.4)
CHLORIDE: 105 meq/L (ref 98–109)
CO2: 30 meq/L — AB (ref 22–29)
CREATININE: 0.8 mg/dL (ref 0.6–1.1)
EGFR: 85 mL/min/{1.73_m2} — ABNORMAL LOW (ref >90)
Glucose: 106 mg/dl (ref 70–140)
POTASSIUM: 3.4 meq/L — AB (ref 3.5–5.1)
Sodium: 144 mEq/L (ref 136–145)
Total Bilirubin: 0.42 mg/dL (ref 0.20–1.20)
Total Protein: 7.2 g/dL (ref 6.4–8.3)

## 2014-07-31 LAB — CBC WITH DIFFERENTIAL/PLATELET
BASO%: 0.6 % (ref 0.0–2.0)
BASOS ABS: 0 10*3/uL (ref 0.0–0.1)
EOS ABS: 0.1 10*3/uL (ref 0.0–0.5)
EOS%: 2.3 % (ref 0.0–7.0)
HCT: 40.2 % (ref 34.8–46.6)
HEMOGLOBIN: 13.1 g/dL (ref 11.6–15.9)
LYMPH%: 24.6 % (ref 14.0–49.7)
MCH: 28.3 pg (ref 25.1–34.0)
MCHC: 32.5 g/dL (ref 31.5–36.0)
MCV: 87.1 fL (ref 79.5–101.0)
MONO#: 0.3 10*3/uL (ref 0.1–0.9)
MONO%: 5.9 % (ref 0.0–14.0)
NEUT%: 66.6 % (ref 38.4–76.8)
NEUTROS ABS: 3.6 10*3/uL (ref 1.5–6.5)
PLATELETS: 214 10*3/uL (ref 145–400)
RBC: 4.62 10*6/uL (ref 3.70–5.45)
RDW: 13.8 % (ref 11.2–14.5)
WBC: 5.5 10*3/uL (ref 3.9–10.3)
lymph#: 1.3 10*3/uL (ref 0.9–3.3)

## 2014-07-31 MED ORDER — LORAZEPAM 0.5 MG PO TABS: 0.5000 mg | ORAL_TABLET | Freq: Two times a day (BID) | ORAL | Status: DC | PRN

## 2014-07-31 NOTE — Telephone Encounter (Signed)
, °

## 2014-07-31 NOTE — Progress Notes (Signed)
ID: Brandi Bates   DOB: August 19, 1955  MR#: [id number]  JFH#:[id number]  PCP: [name], MD  CHIEF COMPLAINT: hx bilateral breast cancers, status post mastecomties CURRENT TREATMENT: anastrozole daily   BREAST CANCER HISTORY: The patient felt a mass in her left axilla several months ago.  She did not bring this to her physician's attention because she wasn't sure it really meant anything, but she asked her daughter, and her daughter asked a nurse fried, and they told her she better get on and have this evaluated, so she did bring it to [name] attention and he set her up for diagnostic mammography and ultrasonography performed at [facility] on September 22, 2010.  Dr. [name] was able to demonstrate a spiculated density at the 12 o'clock position of the breast with pleomorphic calcifications measuring about 4 cm.  There was a 2nd focal rounded area of increased density and with the axilla, there was an ovoid mass measuring up to 3.8 cm. Ultrasound showed the area of heterogeneous decreased echogenicity as well as the 2nd discrete focus.  The right breast showed only a cystic mass measuring 1 cm which was of no consequence.  Biopsy was performed of the left breast mass only on the same day.  The pathology report ([id number]) biopsied 2 masses in the left breast.  The second mass was "located more superiorly" and was also identified and biopsied.  The pathology report labels both masses "at 12 o'clock" so we will have to clarify this issue.  At any rate, both masses were Grade 3 invasive ductal carcinoma.  The first one was estrogen receptor positive at 61% but progesterone receptor negative with a proliferation marker of 95%.  The second one was ER positive at 98% and PR "positive" at 3%, also with a proliferation marker of 95%.  Both showed no HER2 amplification.  With this information, the patient was referred for breast MRI. This was performed September 30, 2010 and showed the main breast mass to  measure 3.8 cm.  The posterior mass measured 6 millimeters. There were some confluent axillary masses to a maximum measurement of 6.8 cm. The patient underwent definitive surgery 10/23/2011 with results and subsequent treatment as detailed below   INTERVAL HISTORY: Brandi Bates returns today for follow up of her breast cancer. She has been on anastrozole since May 2013. She has hot flashes and vaginal dryness, but her main complaint is arthritic pain. She manages this by exercising her joints and using OTC pain meds when necessary. The interval history is generally unremarkable. She does not exercise, or even plan to because of some foot problems.  REVIEW OF SYSTEMS:  Brandi Bates denies fevers, chills, nausea, vomiting, or changes in bowel or bladder habits. Her appetite is healthy. She has some intermittent headaches, but denies dizziness or light headedness. She denies night sweats, unexplained weight loss, or fatigue. She has no shortness of breath, chest pain, cough, or palpitations. A detailed review of systems is otherwise negative.    Past Medical History  Diagnosis Date  . Numbness of feet   . GERD (gastroesophageal reflux disease)   . Chronic low back pain     "everyday"  . Eczema   . Lymphedema of arm     left  . History of radiation therapy 04/23/11 thru 06/08/11    L breast  . Breast wound     Left breast from radiation, skin graft  . PONV (postoperative nausea and vomiting)   . Migraines   . Neuromuscular disorder  raynauds syndrome   . Shortness of breath on exertion   . Anxiety   . Breast cancer 10/23/10    s/p L mastectomy, chemo/radiation, er/pr +, Her2 -  . Breast cancer 11/10/11    S/P right mastectomy  . Allergy   . Hepatic steatosis 11/20/11    severe   . Bladder cystocele 11/20/11    low-lying ct result  . Pulmonary thromboembolism 11/20/11    ct positive acute w/i segmental branches of right lower lobe  . Pneumonia April 2013  . History of cancer chemotherapy      "memory issues from chemo"  Significant for history of migraines, history of GERD, history of chronic constipation, history of chronic low back pain, history of tobacco abuse, the patient quitting in 1998 with approximately a 50 pack-year history prior to that.  The patient is status post tubal ligation, status post simple hysterectomy without salpingo-oophorectomy and status post cholecystectomy.  PAST SURGICAL HISTORY: Past Surgical History  Procedure Laterality Date  . Abdominal hysterectomy    . Irrigation and debridement abscess  06/16/2011    Procedure: IRRIGATION AND DEBRIDEMENT ABSCESS;  Surgeon: Harl Bowie, MD;  Location: [facility];  Service: General;  Laterality: Left;  incision and drainage of left chest wall abcess  . Bilateral salpingoophorectomy  11/10/11    laparoscopy  . Diagnostic laparoscopy    . Mastectomy  11/10/11    right; w/SNB  . Mastectomy modified radical  10/23/11    left  . Cholecystectomy  1999  . Tubal ligation  1979  . Mastectomy w/ sentinel node biopsy  11/10/2011    Procedure: MASTECTOMY WITH SENTINEL LYMPH NODE BIOPSY;  Surgeon: Harl Bowie, MD;  Location: [place];  Service: General;  Laterality: Right;  . Portacath placement      right subclavian  . Breast surgery Bilateral     mastectomy    FAMILY HISTORY Family History  Problem Relation Age of Onset  . Cancer Mother     lung  . Hypertension Mother   . Cancer Father     lung  . Cancer Brother     BRAIN CANCER  . Hypertension Brother   . Cancer Cousin      2 PATERNAL COUSINS - BREAST CA  . Hypertension Sister   . Hypertension Maternal Uncle   . Hypertension Maternal Grandmother   The patient's father died at the age of 105 and the patient's mother at the age of 65.  Both were smokers and both had lung cancer. The patient had one brother who died at age 58 with glioblastoma multiforme.  She has one surviving brother, one surviving sister who is present today, and a half-brother.  There  are two cousins, both on the father's side, who had breast cancer in their 57s.  There is one uncle with Leverne Humbles disease.  GYNECOLOGIC HISTORY: She is GX P1.  First pregnancy to term at age 63.  She never took hormone replacement.    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  Her husband, Brandi Bates, is a Geologist, engineering for an [employer].  Daughter Brandi Bates works at the same office as her mother.  Son Brandi Bates works for [employer].  The patient has 7 grandchildren. She attends a [employer].     ADVANCED DIRECTIVES: in place  HEALTH MAINTENANCE: History  Substance Use Topics  . Smoking  status: Former Smoker -- 2.00 packs/day for 28 years    Types: Cigarettes    Quit date: 05/31/1997  . Smokeless tobacco: Never Used  . Alcohol Use: No     Colonoscopy:  PAP: s/p hysterectomy  Bone density: SOLIS October 2012, T - 1.4 at [facility]  Lipid panel:  Allergies  Allergen Reactions  . Morphine And Related Hives and Itching    All over the body  . Omeprazole Magnesium Nausea Only  . Gabapentin Other (See Comments)    Unable to sleep  . Latex Rash    Only where touched    Current Outpatient Prescriptions  Medication Sig Dispense Refill  . anastrozole (ARIMIDEX) 1 MG tablet Take 1 tablet (1 mg total) by mouth daily. 30 tablet 12  . aspirin 81 MG tablet Take 81 mg by mouth daily.    . B Complex Vitamins (B COMPLEX-B12) TABS Take 1 tablet by mouth daily.    . calcium carbonate (TUMS - DOSED IN MG ELEMENTAL CALCIUM) 500 MG chewable tablet Chew 1 tablet by mouth as needed.    . calcium-vitamin D 250-100 MG-UNIT per tablet Take 1 tablet by mouth daily.    Marland Kitchen LORazepam (ATIVAN) 0.5 MG tablet Take 1 tablet (0.5 mg total) by mouth 2 (two) times daily as needed for anxiety. 30 tablet 0  . [DISCONTINUED] gabapentin (NEURONTIN) 300 MG capsule daily.     No current facility-administered medications for  this visit.    OBJECTIVE:   Filed Vitals:   07/31/14 1523  BP: 149/69  Pulse: 67  Temp: 98.3 F (36.8 C)  Resp: 18     Body mass index is 30.62 kg/(m^2).    ECOG FS: 1 Filed Weights   07/31/14 1523  Weight: 178 lb 8 oz (80.967 kg)   Middle-aged white woman in no acute distress  Skin: warm, dry  HEENT: sclerae anicteric, conjunctivae pink, oropharynx clear. No thrush or mucositis.  Lymph Nodes: No cervical or supraclavicular lymphadenopathy  Lungs: clear to auscultation bilaterally, no rales, wheezes, or rhonci  Heart: regular rate and rhythm  Abdomen: round, soft, non tender, positive bowel sounds  Musculoskeletal: No focal spinal tenderness, no peripheral edema  Neuro: non focal, well oriented, positive affect  Breasts: right chest wall port. bilateral breast status post mastectomies. No evidence of chest wall recurrence. Bilateral axillae benign.    LAB RESULTS: Lab Results  Component Value Date   WBC 5.5 07/31/2014   NEUTROABS 3.6 07/31/2014   HGB 13.1 07/31/2014   HCT 40.2 07/31/2014   MCV 87.1 07/31/2014   PLT 214 07/31/2014      Chemistry      Component Value Date/Time   NA 144 07/31/2014 1516   NA 142 12/25/2011 1005   K 3.4* 07/31/2014 1516   K 3.9 12/25/2011 1005   CL 106 10/05/2012 1234   CL 106 12/25/2011 1005   CO2 30* 07/31/2014 1516   CO2 21 12/25/2011 1005   BUN 16.5 07/31/2014 1516   BUN 11 12/25/2011 1005   CREATININE 0.8 07/31/2014 1516   CREATININE 0.65 12/25/2011 1005      Component Value Date/Time   CALCIUM 9.8 07/31/2014 1516   CALCIUM 9.7 12/25/2011 1005   ALKPHOS 70 07/31/2014 1516   ALKPHOS 65 12/25/2011 1005   AST 22 07/31/2014 1516   AST 20 12/25/2011 1005   ALT 31 07/31/2014 1516   ALT 30 12/25/2011 1005   BILITOT 0.42 07/31/2014 1516   BILITOT 0.3 12/25/2011 1005  STUDIES:  No results found.  ASSESSMENT:58 y.o.  [place] woman with a BRCA mutation of unknown clinical significance  (1) status post left  modified radical mastectomy March of 2012 for a T3 N3a (stage IIIC)  invasive ductal carcinoma, grade 3,  which was strongly estrogen receptor positive, progesterone receptor and HER2 negative, with an MIB-1 of 95%.   (2) s/p 4 cycles of adjuvant dose dense doxorubicin and cyclophosphamide, and 11 doses of weekly paclitaxel,   (3) s/p radiation to the left chest and regional nodes completed November 2012, at which time she started tamoxifen  (4) s/p right mastectomy with sentinel lymph node sampling 11/10/2011 for a pT1b pN0, stage IA invasive ductal carcinoma, grade 1, with insufficient tumor for estrogen and progesterone testing, but HER-2 negative.  (5) anastrozole started May 2013, interrupted June 2013, resumed July 2013  (6) post-op pulmonary embolus documented 11/20/2011, completed over 6 months of Coumadin anticoagulation  PLAN:  Brenetta is doing well as far as her breast cancer is concerned. She is now 2.5 years out from her last mastectomy and has no evidence of local recurrence. She is tolerating the anastrozole as best she can and plans to continue for 5 total years of antiestrogen therapy.   We discussed the need bone density scans and port removal. At this time she does not have the money for either. She is on a vitamin D supplement with calcium at least. I asked if we could flush her port every 6-8 weeks and she declined stating even this procedure produces a bill down the line. She is working towards removing the port as soon as possible this year.   Dayjah will return in 6 months for labs and a follow up visit. She understands and agrees with this plan. She knows the goal of treatment in her case is cure. She has been encouraged to call with any issues that might arise before her next visit here.  Marcelino Duster    07/31/2014

## 2014-09-13 ENCOUNTER — Other Ambulatory Visit (INDEPENDENT_AMBULATORY_CARE_PROVIDER_SITE_OTHER): Payer: Self-pay | Admitting: Surgery

## 2014-09-24 ENCOUNTER — Encounter (HOSPITAL_BASED_OUTPATIENT_CLINIC_OR_DEPARTMENT_OTHER): Payer: Self-pay | Admitting: *Deleted

## 2014-09-25 NOTE — H&P (Signed)
Brandi Bates 09/13/2014 4:17 PM Location: [facility] Patient #: [id number] DOB: 10-Nov-1955 Married / Language: English / Race: White Female  History of Present Illness ([name] A. [name]; 09/13/2014 4:32 PM) Patient words: discuss port removal.  The patient is a 59 year old female who presents with breast cancer. She is here today to discuss Port-A-Cath removal. She is doing very well and is ready for the port, from a cancer standpoint   Other Problems Brandi Bates, CMA; 09/13/2014 4:18 PM) Back Pain Breast Cancer Gastroesophageal Reflux Disease Migraine Headache Oophorectomy  Past Surgical History Brandi Bates, [place]; 09/13/2014 4:18 PM) Breast Biopsy Bilateral. Gallbladder Surgery - Laparoscopic Hysterectomy (not due to cancer) - Partial Mastectomy Bilateral.  Diagnostic Studies History Brandi Bates, CMA; 09/13/2014 4:18 PM) Colonoscopy never Mammogram >3 years ago  Allergies Brandi Bates, CMA; 09/13/2014 4:19 PM) Morphine Sulfate (Concentrate) *ANALGESICS - OPIOID* Hives, Itching. Omeprazole *CHEMICALS* Gabapentin *ANTICONVULSANTS* Latex  Medication History Brandi Bates, CMA; 09/13/2014 4:20 PM) LORazepam (0.5MG  Tablet, Oral) Active. Anastrozole (1MG  Tablet, Oral) Active. Aspirin (81MG  Tablet, Oral) Active. B Complex (Oral) Active. Tums (500MG  Tablet Chewable, Oral) Active. Calcium-Vitamin D (250-125MG -UNIT Tablet, Oral) Active. Acid Reducer (10MG  Tablet, Oral) Active.  Social History Brandi Bates, [place]; 09/13/2014 4:18 PM) Alcohol use Remotely quit alcohol use. Caffeine use Tea. Illicit drug use Remotely quit drug use. Tobacco use Former smoker.  Family History Brandi Bates, [place]; 09/13/2014 4:18 PM) Alcohol Abuse Brother. Breast Cancer Family Members In General. Cancer Brother. Cervical Cancer Family Members In General. Colon Polyps Brother, Son. Depression Son. Heart  Disease Family Members In General. Heart disease in female family member before age 33 Hypertension Brother, Family Members In [place], Mother, Sister. Migraine Headache Brother, Daughter, Sister, Son. Respiratory Condition Father, Mother.  Pregnancy / Birth History Brandi Bates, [place]; 09/13/2014 4:18 PM) Age at menarche 39 years. Age of menopause 53-50 Gravida 2 Maternal age 26-20 Para 2  Review of Systems ([place]. Brooks CMA; 09/13/2014 4:18 PM) General Not Present- Appetite Loss, Chills, Fatigue, Fever, Night Sweats, Weight Gain and Weight Loss. Skin Not Present- Change in Wart/Mole, Dryness, Hives, Jaundice, New Lesions, Non-Healing Wounds, Rash and Ulcer. HEENT Not Present- Earache, Hearing Loss, Hoarseness, Nose Bleed, Oral Ulcers, Ringing in the Ears, Seasonal Allergies, Sinus Pain, Sore Throat, Visual Disturbances, Wears glasses/contact lenses and Yellow Eyes. Respiratory Not Present- Bloody sputum, Chronic Cough, Difficulty Breathing, Snoring and Wheezing. Breast Not Present- Breast Mass, Breast Pain, Nipple Discharge and Skin Changes. Cardiovascular Present- Leg Cramps. Not Present- Chest Pain, Difficulty Breathing Lying Down, Palpitations, Rapid Heart Rate, Shortness of Breath and Swelling of Extremities. Gastrointestinal Not Present- Abdominal Pain, Bloating, Bloody Stool, Change in Bowel Habits, Chronic diarrhea, Constipation, Difficulty Swallowing, Excessive gas, Gets full quickly at meals, Hemorrhoids, Indigestion, Nausea, Rectal Pain and Vomiting. Female Genitourinary Not Present- Frequency, Nocturia, Painful Urination, Pelvic Pain and Urgency. Musculoskeletal Present- Back Pain and Joint Pain. Not Present- Joint Stiffness, Muscle Pain, Muscle Weakness and Swelling of Extremities. Neurological Present- Decreased Memory. Not Present- Fainting, Headaches, Numbness, Seizures, Tingling, Tremor, Trouble walking and Weakness. Psychiatric Not Present- Anxiety, Bipolar,  Change in Sleep Pattern, Depression, Fearful and Frequent crying. Endocrine Not Present- Cold Intolerance, Excessive Hunger, Hair Changes, Heat Intolerance, Hot flashes and New Diabetes. Hematology Present- Easy Bruising. Not Present- Excessive bleeding, Gland problems, HIV and Persistent Infections.   Vitals [employer] R. Brooks CMA; 09/13/2014 4:17 PM) 09/13/2014 4:17 PM Weight: 180 lb Height: 64in Body Surface Area: 1.92 m  Body Mass Index: 30.9 kg/m BP: 130/84 (Sitting, Left Arm, Standard)    Physical Exam (Brandi Bates A. [name]; 09/13/2014 4:32 PM) The physical exam findings are as follows: Note:She looks great on exam Bilateral mastectomy Lungs clear CV RRR Skin without rashes or jaundice Abdomen soft, NT/ND  The right chest port site is well-healed    Assessment & Plan (Brandi Bates A. [name]; 09/13/2014 4:33 PM) BREAST CANCER (174.9  C50.919) Impression: We will now proceed with Port-A-Cath removal. I discussed the risks with her Current Plans

## 2014-09-26 ENCOUNTER — Ambulatory Visit (HOSPITAL_BASED_OUTPATIENT_CLINIC_OR_DEPARTMENT_OTHER): Payer: 59 | Admitting: Anesthesiology

## 2014-09-26 ENCOUNTER — Ambulatory Visit (HOSPITAL_BASED_OUTPATIENT_CLINIC_OR_DEPARTMENT_OTHER)
Admission: RE | Admit: 2014-09-26 | Discharge: 2014-09-26 | Disposition: A | Discharge status: Home / Self Care | Payer: 59 | Source: Ambulatory Visit | Attending: Surgery | Admitting: Surgery

## 2014-09-26 ENCOUNTER — Encounter (HOSPITAL_BASED_OUTPATIENT_CLINIC_OR_DEPARTMENT_OTHER)
Admission: RE | Disposition: A | Discharge status: Home / Self Care | Payer: Self-pay | Source: Ambulatory Visit | Attending: Surgery

## 2014-09-26 ENCOUNTER — Encounter (HOSPITAL_BASED_OUTPATIENT_CLINIC_OR_DEPARTMENT_OTHER): Payer: Self-pay | Admitting: *Deleted

## 2014-09-26 DIAGNOSIS — Z9013 Acquired absence of bilateral breasts and nipples: Secondary | ICD-10-CM | POA: Diagnosis not present

## 2014-09-26 DIAGNOSIS — Z853 Personal history of malignant neoplasm of breast: Secondary | ICD-10-CM | POA: Insufficient documentation

## 2014-09-26 DIAGNOSIS — Z7982 Long term (current) use of aspirin: Secondary | ICD-10-CM | POA: Diagnosis not present

## 2014-09-26 DIAGNOSIS — Z452 Encounter for adjustment and management of vascular access device: Secondary | ICD-10-CM | POA: Insufficient documentation

## 2014-09-26 DIAGNOSIS — Z803 Family history of malignant neoplasm of breast: Secondary | ICD-10-CM | POA: Diagnosis not present

## 2014-09-26 DIAGNOSIS — G43909 Migraine, unspecified, not intractable, without status migrainosus: Secondary | ICD-10-CM | POA: Diagnosis not present

## 2014-09-26 DIAGNOSIS — Z9889 Other specified postprocedural states: Secondary | ICD-10-CM | POA: Insufficient documentation

## 2014-09-26 DIAGNOSIS — Z87891 Personal history of nicotine dependence: Secondary | ICD-10-CM | POA: Diagnosis not present

## 2014-09-26 DIAGNOSIS — F419 Anxiety disorder, unspecified: Secondary | ICD-10-CM | POA: Insufficient documentation

## 2014-09-26 DIAGNOSIS — Z79899 Other long term (current) drug therapy: Secondary | ICD-10-CM | POA: Diagnosis not present

## 2014-09-26 DIAGNOSIS — K219 Gastro-esophageal reflux disease without esophagitis: Secondary | ICD-10-CM | POA: Insufficient documentation

## 2014-09-26 HISTORY — PX: PORT-A-CATH REMOVAL: SHX5289

## 2014-09-26 SURGERY — MINOR REMOVAL PORT-A-CATH
Anesthesia: LOCAL | Site: Chest | Laterality: Right

## 2014-09-26 MED ORDER — BUPIVACAINE-EPINEPHRINE (PF) 0.5% -1:200000 IJ SOLN
INTRAMUSCULAR | Status: AC
  Filled 2014-09-26: qty 30

## 2014-09-26 MED ORDER — MIDAZOLAM HCL 2 MG/2ML IJ SOLN
INTRAMUSCULAR | Status: AC
  Filled 2014-09-26: qty 2

## 2014-09-26 MED ORDER — FENTANYL CITRATE 0.05 MG/ML IJ SOLN
INTRAMUSCULAR | Status: AC
  Filled 2014-09-26: qty 4

## 2014-09-26 MED ORDER — LIDOCAINE HCL (PF) 1 % IJ SOLN
INTRAMUSCULAR | Status: DC | PRN
  Administered 2014-09-26: 10 mL

## 2014-09-26 MED ORDER — HYDROCODONE-ACETAMINOPHEN 5-325 MG PO TABS: 1.0000 | ORAL_TABLET | ORAL | Status: DC | PRN

## 2014-09-26 SURGICAL SUPPLY — 37 items
APL SKNCLS STERI-STRIP NONHPOA (GAUZE/BANDAGES/DRESSINGS) ×2
BENZOIN TINCTURE PRP APPL 2/3 (GAUZE/BANDAGES/DRESSINGS) ×3 IMPLANT
BLADE HEX COATED 2.75 (ELECTRODE) ×3 IMPLANT
BLADE SURG 15 STRL LF DISP TIS (BLADE) ×2 IMPLANT
BLADE SURG 15 STRL SS (BLADE) ×3
CHLORAPREP W/TINT 26ML (MISCELLANEOUS) ×3 IMPLANT
COVER BACK TABLE 60X90IN (DRAPES) ×3 IMPLANT
COVER MAYO STAND STRL (DRAPES) ×3 IMPLANT
DECANTER SPIKE VIAL GLASS SM (MISCELLANEOUS) IMPLANT
DRAPE LAPAROTOMY 100X72 PEDS (DRAPES) ×3 IMPLANT
DRAPE UTILITY XL STRL (DRAPES) ×3 IMPLANT
DRSG TEGADERM 2-3/8X2-3/4 SM (GAUZE/BANDAGES/DRESSINGS) ×3 IMPLANT
DRSG TEGADERM 4X4.75 (GAUZE/BANDAGES/DRESSINGS) ×3 IMPLANT
ELECT REM PT RETURN 9FT ADLT (ELECTROSURGICAL) ×3
ELECTRODE REM PT RTRN 9FT ADLT (ELECTROSURGICAL) ×2 IMPLANT
GLOVE SURG SIGNA 7.5 PF LTX (GLOVE) ×1 IMPLANT
GOWN STRL REUS W/ TWL LRG LVL3 (GOWN DISPOSABLE) ×2 IMPLANT
GOWN STRL REUS W/ TWL XL LVL3 (GOWN DISPOSABLE) ×2 IMPLANT
GOWN STRL REUS W/TWL LRG LVL3 (GOWN DISPOSABLE) ×3
GOWN STRL REUS W/TWL XL LVL3 (GOWN DISPOSABLE) ×3
NDL HYPO 25X1 1.5 SAFETY (NEEDLE) ×1 IMPLANT
NEEDLE HYPO 25X1 1.5 SAFETY (NEEDLE) ×3 IMPLANT
NS IRRIG 1000ML POUR BTL (IV SOLUTION) ×3 IMPLANT
PACK BASIN DAY SURGERY FS (CUSTOM PROCEDURE TRAY) ×3 IMPLANT
PENCIL BUTTON HOLSTER BLD 10FT (ELECTRODE) ×3 IMPLANT
SLEEVE SCD COMPRESS KNEE MED (MISCELLANEOUS) IMPLANT
SPONGE GAUZE 2X2 8PLY STRL LF (GAUZE/BANDAGES/DRESSINGS) ×3 IMPLANT
SPONGE GAUZE 4X4 12PLY STER LF (GAUZE/BANDAGES/DRESSINGS) ×3 IMPLANT
STRIP CLOSURE SKIN 1/2X4 (GAUZE/BANDAGES/DRESSINGS) ×3 IMPLANT
SUT MNCRL AB 4-0 PS2 18 (SUTURE) ×3 IMPLANT
SUT SILK 2 0 SH (SUTURE) ×3 IMPLANT
SUT VIC AB 3-0 SH 27 (SUTURE) ×3
SUT VIC AB 3-0 SH 27X BRD (SUTURE) ×2 IMPLANT
SYR BULB 3OZ (MISCELLANEOUS) IMPLANT
SYR CONTROL 10ML LL (SYRINGE) ×3 IMPLANT
TOWEL OR 17X24 6PK STRL BLUE (TOWEL DISPOSABLE) ×3 IMPLANT
TOWEL OR NON WOVEN STRL DISP B (DISPOSABLE) ×3 IMPLANT

## 2014-09-26 NOTE — Anesthesia Preprocedure Evaluation (Signed)
Anesthesia Evaluation  Patient identified by MRN, date of birth, ID band Patient awake    Reviewed: Allergy & Precautions, NPO status , Patient's Chart, lab work & pertinent test results  History of Anesthesia Complications (+) PONV  Airway Mallampati: II   Neck ROM: full    Dental   Pulmonary shortness of breath, former smoker,          Cardiovascular     Neuro/Psych  Headaches, Anxiety  Neuromuscular disease    GI/Hepatic GERD-  ,  Endo/Other    Renal/GU      Musculoskeletal   Abdominal   Peds  Hematology   Anesthesia Other Findings   Reproductive/Obstetrics Breast CA                             Anesthesia Physical Anesthesia Plan  ASA: II  Anesthesia Plan: MAC   Post-op Pain Management:    Induction: Intravenous  Airway Management Planned: Simple Face Mask  Additional Equipment:   Intra-op Plan:   Post-operative Plan:   Informed Consent: I have reviewed the patients History and Physical, chart, labs and discussed the procedure including the risks, benefits and alternatives for the proposed anesthesia with the patient or authorized representative who has indicated his/her understanding and acceptance.     Plan Discussed with: CRNA, Anesthesiologist and Surgeon  Anesthesia Plan Comments:         Anesthesia Quick Evaluation

## 2014-09-26 NOTE — Discharge Instructions (Signed)
May shower starting tomorrow.  Call surgeon office for increased redness, swelling, drainage, fever, or other signs of infection.  Advised by RN to follow-up with elevated B/P with primary care physician.

## 2014-09-26 NOTE — Op Note (Signed)
MINOR REMOVAL PORT-A-CATH  Procedure Note  LILYROSE TANNEY 09/26/2014   Pre-op Diagnosis: History of Breast Cancer     Post-op Diagnosis: same  Procedure(s): MINOR REMOVAL PORT-A-CATH  Surgeon(s): Coralie Keens, MD  Anesthesia: Local  Staff:  Circulator: Maurene Capes, RN Scrub Person: Silvio Clayman, CST  Estimated Blood Loss: Minimal  Procedure: The patient was brought to the operating room and identified as the correct patient. She is placed upon the operating table. Her right chest was prepped and draped in usual sterile fashion. I anesthetized the previous scar on the chest with lidocaine. I then made an incision with the scalpel. I easily identified the port and cut the sutures holding it in place. I then was able to easily pull the entire port and catheter out completely attached. I then sewed the needle track shot with a 3-0 Vicryl suture. I then closed subjacent tissue with interrupted 3-0 Vicryl sutures and closed the skin with a running 4-0 Monocryl. Liquid glue was then applied. The patient tolerated the procedure well.         Yaquelin Langelier A   Date: 09/26/2014  Time: 1:05 PM

## 2014-09-26 NOTE — Interval H&P Note (Signed)
History and Physical Interval Note:no change in H and P  09/26/2014 11:17 AM  Brandi Bates  has presented today for surgery, with the diagnosis of History of Breast Cancer  The various methods of treatment have been discussed with the patient and family. After consideration of risks, benefits and other options for treatment, the patient has consented to  Procedure(s): REMOVAL PORT-A-CATH (N/A) as a surgical intervention .  The patient's history has been reviewed, patient examined, no change in status, stable for surgery.  I have reviewed the patient's chart and labs.  Questions were answered to the patient's satisfaction.     [name]

## 2014-09-27 ENCOUNTER — Encounter (HOSPITAL_BASED_OUTPATIENT_CLINIC_OR_DEPARTMENT_OTHER): Payer: Self-pay | Admitting: Surgery

## 2014-10-12 ENCOUNTER — Other Ambulatory Visit: Payer: Self-pay | Admitting: Oncology

## 2014-11-08 ENCOUNTER — Other Ambulatory Visit: Payer: Self-pay | Admitting: *Deleted

## 2014-11-08 MED ORDER — ANASTROZOLE 1 MG PO TABS: 1.0000 mg | ORAL_TABLET | Freq: Every day | ORAL | Status: DC

## 2015-02-04 ENCOUNTER — Other Ambulatory Visit: Payer: Self-pay | Admitting: *Deleted

## 2015-02-04 DIAGNOSIS — C50911 Malignant neoplasm of unspecified site of right female breast: Secondary | ICD-10-CM

## 2015-02-05 ENCOUNTER — Other Ambulatory Visit (HOSPITAL_BASED_OUTPATIENT_CLINIC_OR_DEPARTMENT_OTHER): Payer: 59

## 2015-02-05 ENCOUNTER — Telehealth: Payer: Self-pay | Admitting: Oncology

## 2015-02-05 ENCOUNTER — Ambulatory Visit (HOSPITAL_BASED_OUTPATIENT_CLINIC_OR_DEPARTMENT_OTHER): Payer: 59 | Admitting: Oncology

## 2015-02-05 VITALS — BP 135/71 | HR 74 | Temp 98.2°F | Resp 18 | Ht 64.0 in | Wt 175.2 lb

## 2015-02-05 DIAGNOSIS — Z86711 Personal history of pulmonary embolism: Secondary | ICD-10-CM

## 2015-02-05 DIAGNOSIS — C50911 Malignant neoplasm of unspecified site of right female breast: Secondary | ICD-10-CM

## 2015-02-05 DIAGNOSIS — C50812 Malignant neoplasm of overlapping sites of left female breast: Secondary | ICD-10-CM | POA: Diagnosis not present

## 2015-02-05 LAB — CBC WITH DIFFERENTIAL/PLATELET
BASO%: 0.2 % (ref 0.0–2.0)
Basophils Absolute: 0 10*3/uL (ref 0.0–0.1)
EOS ABS: 0.1 10*3/uL (ref 0.0–0.5)
EOS%: 1.8 % (ref 0.0–7.0)
HEMATOCRIT: 39.9 % (ref 34.8–46.6)
HEMOGLOBIN: 13.2 g/dL (ref 11.6–15.9)
LYMPH%: 22.7 % (ref 14.0–49.7)
MCH: 28.4 pg (ref 25.1–34.0)
MCHC: 33.2 g/dL (ref 31.5–36.0)
MCV: 85.5 fL (ref 79.5–101.0)
MONO#: 0.5 10*3/uL (ref 0.1–0.9)
MONO%: 7.2 % (ref 0.0–14.0)
NEUT%: 68.1 % (ref 38.4–76.8)
NEUTROS ABS: 4.4 10*3/uL (ref 1.5–6.5)
Platelets: 210 10*3/uL (ref 145–400)
RBC: 4.66 10*6/uL (ref 3.70–5.45)
RDW: 14.1 % (ref 11.2–14.5)
WBC: 6.5 10*3/uL (ref 3.9–10.3)
lymph#: 1.5 10*3/uL (ref 0.9–3.3)

## 2015-02-05 LAB — COMPREHENSIVE METABOLIC PANEL (CC13)
ALK PHOS: 95 U/L (ref 40–150)
ALT: 29 U/L (ref 0–55)
AST: 21 U/L (ref 5–34)
Albumin: 4 g/dL (ref 3.5–5.0)
Anion Gap: 8 mEq/L (ref 3–11)
BILIRUBIN TOTAL: 0.43 mg/dL (ref 0.20–1.20)
BUN: 19.8 mg/dL (ref 7.0–26.0)
CALCIUM: 9.5 mg/dL (ref 8.4–10.4)
CO2: 27 mEq/L (ref 22–29)
Chloride: 107 mEq/L (ref 98–109)
Creatinine: 0.8 mg/dL (ref 0.6–1.1)
EGFR: 80 mL/min/{1.73_m2} — ABNORMAL LOW (ref >90)
Glucose: 97 mg/dl (ref 70–140)
Potassium: 3.8 mEq/L (ref 3.5–5.1)
Sodium: 142 mEq/L (ref 136–145)
Total Protein: 7 g/dL (ref 6.4–8.3)

## 2015-02-05 MED ORDER — ANASTROZOLE 1 MG PO TABS: 1.0000 mg | ORAL_TABLET | Freq: Every day | ORAL | Status: DC

## 2015-02-05 NOTE — Progress Notes (Signed)
ID: Brandi Bates   DOB: 1956-06-02  MR#: [id number]  QMG#:[id number]  PCP: [name], MD  CHIEF COMPLAINT:  bilateral breast cancers, both estrogen receptor positive  CURRENT TREATMENT: anastrozole    BREAST CANCER HISTORY: From the original intake note:  The patient felt a mass in her left axilla several months ago.  She did not bring this to her physician's attention because she wasn't sure it really meant anything, but she asked her daughter, and her daughter asked a nurse fried, and they told her she better get on and have this evaluated, so she did bring it to [name] attention and he set her up for diagnostic mammography and ultrasonography performed at [facility] on September 22, 2010.  Dr. [name] was able to demonstrate a spiculated density at the 12 o'clock position of the breast with pleomorphic calcifications measuring about 4 cm.  There was a 2nd focal rounded area of increased density and with the axilla, there was an ovoid mass measuring up to 3.8 cm. Ultrasound showed the area of heterogeneous decreased echogenicity as well as the 2nd discrete focus.  The right breast showed only a cystic mass measuring 1 cm which was of no consequence.  Biopsy was performed of the left breast mass only on the same day.  The pathology report ([id number]) biopsied 2 masses in the left breast.  The second mass was "located more superiorly" and was also identified and biopsied.  The pathology report labels both masses "at 12 o'clock" so we will have to clarify this issue.  At any rate, both masses were Grade 3 invasive ductal carcinoma.  The first one was estrogen receptor positive at 61% but progesterone receptor negative with a proliferation marker of 95%.  The second one was ER positive at 98% and PR "positive" at 3%, also with a proliferation marker of 95%.  Both showed no HER2 amplification.  With this information, the patient was referred for breast MRI. This was performed September 30, 2010  and showed the main breast mass to measure 3.8 cm.  The posterior mass measured 6 millimeters. There were some confluent axillary masses to a maximum measurement of 6.8 cm. The patient underwent definitive surgery 10/23/2011 with results and subsequent treatment as detailed below   INTERVAL HISTORY: [name] returns today for follow up of her breast cancer accompanied by her husband [name]. She tells me she is doing "terrific". She obtains the anastrozole at less than $9 a month. She does have bony and muscle aches especially in the morning or after sitting for long time. She also has significant hot flashes. Vaginal dryness is less of a concern.  REVIEW OF SYSTEMS:  She is concerned about weight gain. A detailed review of systems today was otherwise noncontributory.    Past Medical History  Diagnosis Date  . Numbness of feet   . GERD (gastroesophageal reflux disease)   . Chronic low back pain     "everyday"  . Eczema   . Lymphedema of arm     left  . History of radiation therapy 04/23/11 thru 06/08/11    L breast  . Breast wound     Left breast from radiation, skin graft  . PONV (postoperative nausea and vomiting)   . Migraines   . Neuromuscular disorder     raynauds syndrome   . Shortness of breath on exertion   . Anxiety   . Breast cancer 10/23/10    s/p L mastectomy, chemo/radiation, er/pr +, Her2 -  .  Breast cancer 11/10/11    S/P right mastectomy  . Allergy   . Hepatic steatosis 11/20/11    severe   . Bladder cystocele 11/20/11    low-lying ct result  . Pulmonary thromboembolism 11/20/11    ct positive acute w/i segmental branches of right lower lobe  . Pneumonia April 2013  . History of cancer chemotherapy     "memory issues from chemo"  Significant for history of migraines, history of GERD, history of chronic constipation, history of chronic low back pain, history of tobacco abuse, the patient quitting in 1998 with approximately a 50 pack-year history prior to that.  The  patient is status post tubal ligation, status post simple hysterectomy without salpingo-oophorectomy and status post cholecystectomy.  PAST SURGICAL HISTORY: Past Surgical History  Procedure Laterality Date  . Abdominal hysterectomy    . Irrigation and debridement abscess  06/16/2011    Procedure: IRRIGATION AND DEBRIDEMENT ABSCESS;  Surgeon: Harl Bowie, MD;  Location: [facility];  Service: General;  Laterality: Left;  incision and drainage of left chest wall abcess  . Bilateral salpingoophorectomy  11/10/11    laparoscopy  . Diagnostic laparoscopy    . Mastectomy  11/10/11    right; w/SNB  . Mastectomy modified radical  10/23/11    left  . Cholecystectomy  1999  . Tubal ligation  1979  . Mastectomy w/ sentinel node biopsy  11/10/2011    Procedure: MASTECTOMY WITH SENTINEL LYMPH NODE BIOPSY;  Surgeon: Harl Bowie, MD;  Location: [place];  Service: General;  Laterality: Right;  . Portacath placement      right subclavian  . Breast surgery Bilateral     mastectomy  . Port-a-cath removal Right 09/26/2014    Procedure: MINOR REMOVAL PORT-A-CATH;  Surgeon: Coralie Keens, MD;  Location: [place];  Service: General;  Laterality: Right;    FAMILY HISTORY Family History  Problem Relation Age of Onset  . Cancer Mother     lung  . Hypertension Mother   . Cancer Father     lung  . Cancer Brother     BRAIN CANCER  . Hypertension Brother   . Cancer Cousin      2 PATERNAL COUSINS - BREAST CA  . Hypertension Sister   . Hypertension Maternal Uncle   . Hypertension Maternal Grandmother   The patient's father died at the age of 88 and the patient's mother at the age of 21.  Both were smokers and both had lung cancer. The patient had one brother who died at age 81 with glioblastoma multiforme.  She has one surviving brother, one surviving sister who is present today, and a half-brother.  There are two cousins, both on the father's side, who had breast cancer in their  20s.  There is one uncle with Leverne Humbles disease.  GYNECOLOGIC HISTORY: She is GX P1.  First pregnancy to term at age 32.  She never took hormone replacement.    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  Her husband, [name], is a Geologist, engineering for an [employer].  Daughter Leveda Anna works at the same office as her mother.  Son Louie Casa works for [employer].  The patient has 7 grandchildren. She attends a [employer].     ADVANCED DIRECTIVES: in place  HEALTH MAINTENANCE: History  Substance Use Topics  . Smoking status: Former Smoker --  2.00 packs/day for 28 years    Types: Cigarettes    Quit date: 05/31/1997  . Smokeless tobacco: Never Used  . Alcohol Use: No     Colonoscopy:  PAP: s/p hysterectomy  Bone density: SOLIS October 2012, T - 1.4 at [facility]  Lipid panel:  Allergies  Allergen Reactions  . Morphine And Related Hives and Itching    All over the body  . Omeprazole Magnesium Nausea Only  . Gabapentin Other (See Comments)    Unable to sleep  . Latex Rash    Only where touched    Current Outpatient Prescriptions  Medication Sig Dispense Refill  . anastrozole (ARIMIDEX) 1 MG tablet Take 1 tablet (1 mg total) by mouth daily. 30 tablet 2  . aspirin 81 MG tablet Take 81 mg by mouth daily.    . B Complex Vitamins (B COMPLEX-B12) TABS Take 1 tablet by mouth daily.    . calcium carbonate (TUMS - DOSED IN MG ELEMENTAL CALCIUM) 500 MG chewable tablet Chew 1 tablet by mouth as needed.    . calcium-vitamin D 250-100 MG-UNIT per tablet Take 1 tablet by mouth daily.    Marland Kitchen HYDROcodone-acetaminophen (NORCO) 5-325 MG per tablet Take 1-2 tablets by mouth every 4 (four) hours as needed. 30 tablet 0  . LORazepam (ATIVAN) 0.5 MG tablet Take 1 tablet (0.5 mg total) by mouth 2 (two) times daily as needed for anxiety. 30 tablet 0  . [DISCONTINUED] gabapentin (NEURONTIN) 300 MG capsule daily.      No current facility-administered medications for this visit.    OBJECTIVE:  Middle-aged white woman in no acute distress Filed Vitals:   02/05/15 1552  BP: 135/71  Pulse: 74  Temp: 98.2 F (36.8 C)  Resp: 18     Body mass index is 30.06 kg/(m^2).    ECOG FS: 1 Filed Weights   02/05/15 1552  Weight: 175 lb 3.2 oz (79.47 kg)    Sclerae unicteric, pupils round and equal Oropharynx clear and moist-- no thrush or other lesions No cervical or supraclavicular adenopathy Lungs no rales or rhonchi Heart regular rate and rhythm Abd soft, obese, nontender, positive bowel sounds MSK no focal spinal tenderness, no upper extremity lymphedema Neuro: nonfocal, well oriented, appropriate affect Breasts: status post bilateral mastectomies. The left side is also status post radiation. The area of the left incision is irregular and somewhat tender, which is not a new finding. There is no evidence of local recurrence across the chest wall or in either axilla    LAB RESULTS: Lab Results  Component Value Date   WBC 6.5 02/05/2015   NEUTROABS 4.4 02/05/2015   HGB 13.2 02/05/2015   HCT 39.9 02/05/2015   MCV 85.5 02/05/2015   PLT 210 02/05/2015      Chemistry      Component Value Date/Time   NA 144 07/31/2014 1516   NA 142 12/25/2011 1005   K 3.4* 07/31/2014 1516   K 3.9 12/25/2011 1005   CL 106 10/05/2012 1234   CL 106 12/25/2011 1005   CO2 30* 07/31/2014 1516   CO2 21 12/25/2011 1005   BUN 16.5 07/31/2014 1516   BUN 11 12/25/2011 1005   CREATININE 0.8 07/31/2014 1516   CREATININE 0.65 12/25/2011 1005      Component Value Date/Time   CALCIUM 9.8 07/31/2014 1516   CALCIUM 9.7 12/25/2011 1005   ALKPHOS 70 07/31/2014 1516   ALKPHOS 65 12/25/2011 1005   AST 22 07/31/2014 1516   AST 20 12/25/2011  1005   ALT 31 07/31/2014 1516   ALT 30 12/25/2011 1005   BILITOT 0.42 07/31/2014 1516   BILITOT 0.3 12/25/2011 1005        STUDIES:  No results found.  ASSESSMENT:58 y.o.   [place] woman with a BRCA mutation of unknown clinical significance  (1) status post left modified radical mastectomy March of 2012 for a T3 N3a (stage IIIC)  invasive ductal carcinoma, grade 3,  which was strongly estrogen receptor positive, progesterone receptor and HER2 negative, with an MIB-1 of 95%.   (2) s/p 4 cycles of adjuvant dose dense doxorubicin and cyclophosphamide, and 11 doses of weekly paclitaxel,   (3) s/p radiation to the left chest and regional nodes completed November 2012, at which time she started tamoxifen  (4) s/p right mastectomy with sentinel lymph node sampling 11/10/2011 for a pT1b pN0, stage IA invasive ductal carcinoma, grade 1, with insufficient tumor for estrogen and progesterone testing, but HER-2 negative.  (5) anastrozole started May 2013, interrupted June 2013, resumed July 2013  (6) post-op pulmonary embolus documented 11/20/2011, completed over 6 months of Coumadin anticoagulation  PLAN:  Courtni is now 4 years out from her definitive surgery, for her locally advanced breast cancer. There is no evidence of recurrence. This is very favorable.  She is 3 years into her anastrozole. She tolerates it, but it is a bit of an effort. We discussed the recent data that shows that 10 years of anastrozole is superior to 5. I think in her case I would strongly urge her to continue, since she had so many positive lymph nodes at baseline. She is not enthusiastic, but is willing to go along.  She might feel better if she exercises regularly. It is difficult for her to find the time to do this. I gave her a copy of the [employer], and urged her to call and enroll. I also gave her a copy of our intimacy and pelvic health pamphlet.  Otherwise she will return to see [place] in 6 months and then again 6 months after that. We will do review of systems and physical exam, and lab work before each visit. She knows to call for any problems that may develop before that  visit.  Willford Rabideau C    02/05/2015

## 2015-02-05 NOTE — Telephone Encounter (Signed)
Gave avs & calendar for January °

## 2015-05-26 ENCOUNTER — Other Ambulatory Visit: Payer: Self-pay | Admitting: Oncology

## 2015-05-27 NOTE — Telephone Encounter (Signed)
Chart reviewed.

## 2015-08-05 ENCOUNTER — Other Ambulatory Visit: Payer: Self-pay | Admitting: *Deleted

## 2015-08-05 DIAGNOSIS — C50911 Malignant neoplasm of unspecified site of right female breast: Secondary | ICD-10-CM

## 2015-08-06 ENCOUNTER — Other Ambulatory Visit: Payer: Self-pay | Admitting: *Deleted

## 2015-08-06 ENCOUNTER — Telehealth: Payer: Self-pay | Admitting: Nurse Practitioner

## 2015-08-06 ENCOUNTER — Other Ambulatory Visit (HOSPITAL_BASED_OUTPATIENT_CLINIC_OR_DEPARTMENT_OTHER): Payer: 59

## 2015-08-06 ENCOUNTER — Encounter: Payer: Self-pay | Admitting: Nurse Practitioner

## 2015-08-06 ENCOUNTER — Ambulatory Visit (HOSPITAL_BASED_OUTPATIENT_CLINIC_OR_DEPARTMENT_OTHER): Payer: 59 | Admitting: Nurse Practitioner

## 2015-08-06 VITALS — BP 145/67 | HR 77 | Temp 98.2°F | Resp 18 | Wt 176.9 lb

## 2015-08-06 DIAGNOSIS — C50812 Malignant neoplasm of overlapping sites of left female breast: Secondary | ICD-10-CM

## 2015-08-06 DIAGNOSIS — Z170 Estrogen receptor positive status [ER+]: Secondary | ICD-10-CM | POA: Diagnosis not present

## 2015-08-06 DIAGNOSIS — Z86711 Personal history of pulmonary embolism: Secondary | ICD-10-CM | POA: Diagnosis not present

## 2015-08-06 DIAGNOSIS — C50911 Malignant neoplasm of unspecified site of right female breast: Secondary | ICD-10-CM

## 2015-08-06 DIAGNOSIS — E2839 Other primary ovarian failure: Secondary | ICD-10-CM

## 2015-08-06 LAB — CBC WITH DIFFERENTIAL/PLATELET
BASO%: 0 % (ref 0.0–2.0)
Basophils Absolute: 0 10*3/uL (ref 0.0–0.1)
EOS ABS: 0.1 10*3/uL (ref 0.0–0.5)
EOS%: 3.2 % (ref 0.0–7.0)
HEMATOCRIT: 42.1 % (ref 34.8–46.6)
HEMOGLOBIN: 13.6 g/dL (ref 11.6–15.9)
LYMPH%: 24.8 % (ref 14.0–49.7)
MCH: 28.5 pg (ref 25.1–34.0)
MCHC: 32.3 g/dL (ref 31.5–36.0)
MCV: 88.1 fL (ref 79.5–101.0)
MONO#: 0.2 10*3/uL (ref 0.1–0.9)
MONO%: 4.6 % (ref 0.0–14.0)
NEUT#: 3 10*3/uL (ref 1.5–6.5)
NEUT%: 67.4 % (ref 38.4–76.8)
Platelets: 170 10*3/uL (ref 145–400)
RBC: 4.78 10*6/uL (ref 3.70–5.45)
RDW: 13.4 % (ref 11.2–14.5)
WBC: 4.4 10*3/uL (ref 3.9–10.3)
lymph#: 1.1 10*3/uL (ref 0.9–3.3)

## 2015-08-06 LAB — COMPREHENSIVE METABOLIC PANEL
ALK PHOS: 99 U/L (ref 40–150)
ALT: 31 U/L (ref 0–55)
AST: 20 U/L (ref 5–34)
Albumin: 3.9 g/dL (ref 3.5–5.0)
Anion Gap: 10 mEq/L (ref 3–11)
BUN: 15 mg/dL (ref 7.0–26.0)
CALCIUM: 9.7 mg/dL (ref 8.4–10.4)
CHLORIDE: 109 meq/L (ref 98–109)
CO2: 24 mEq/L (ref 22–29)
Creatinine: 0.8 mg/dL (ref 0.6–1.1)
EGFR: 86 mL/min/{1.73_m2} — AB (ref >90)
Glucose: 112 mg/dl (ref 70–140)
POTASSIUM: 4 meq/L (ref 3.5–5.1)
Sodium: 143 mEq/L (ref 136–145)
Total Bilirubin: 0.46 mg/dL (ref 0.20–1.20)
Total Protein: 7.1 g/dL (ref 6.4–8.3)

## 2015-08-06 MED ORDER — ANASTROZOLE 1 MG PO TABS: 1.0000 mg | ORAL_TABLET | Freq: Every day | ORAL | Status: DC

## 2015-08-06 NOTE — Progress Notes (Signed)
ID: Brandi Bates   DOB: 18-Dec-1955  MR#: [id number]  MHD#:[id number]  PCP: Brandi Heck, MD  CHIEF COMPLAINT:  bilateral breast cancers, both estrogen receptor positive  CURRENT TREATMENT: anastrozole   BREAST CANCER HISTORY: From the original intake note:  The patient felt a mass in her left axilla several months ago.  She did not bring this to her physician's attention because she wasn't sure it really meant anything, but she asked her daughter, and her daughter asked a nurse fried, and they told her she better get on and have this evaluated, so she did bring it to Brandi Bates attention and he set her up for diagnostic mammography and ultrasonography performed at [facility] on September 22, 2010.  Dr. Marcelo Bates was able to demonstrate a spiculated density at the 12 o'clock position of the breast with pleomorphic calcifications measuring about 4 cm.  There was a 2nd focal rounded area of increased density and with the axilla, there was an ovoid mass measuring up to 3.8 cm. Ultrasound showed the area of heterogeneous decreased echogenicity as well as the 2nd discrete focus.  The right breast showed only a cystic mass measuring 1 cm which was of no consequence.  Biopsy was performed of the left breast mass only on the same day.  The pathology report ([id number]) biopsied 2 masses in the left breast.  The second mass was "located more superiorly" and was also identified and biopsied.  The pathology report labels both masses "at 12 o'clock" so we will have to clarify this issue.  At any rate, both masses were Grade 3 invasive ductal carcinoma.  The first one was estrogen receptor positive at 61% but progesterone receptor negative with a proliferation marker of 95%.  The second one was ER positive at 98% and PR "positive" at 3%, also with a proliferation marker of 95%.  Both showed no HER2 amplification.  With this information, the patient was referred for breast MRI. This was performed September 30, 2010 and  showed the main breast mass to measure 3.8 cm.  The posterior mass measured 6 millimeters. There were some confluent axillary masses to a maximum measurement of 6.8 cm. The patient underwent definitive surgery 10/23/2011 with results and subsequent treatment as detailed below   INTERVAL HISTORY: Brandi Bates returns today for follow up of her breast cancer, accompanied by her husband Brandi Bates. She has been on anastrozole for 3.5 years and is tolerating this drug reasonably well. She does have joint stiffness and aches. She is managing her hot flashes on her own, but denies vaginal changes. The interval history is generally unremarkable.  REVIEW OF SYSTEMS:  Brandi Bates denies fevers, chills, nausea, vomiting, or changes in bowel or bladder habits. She has leg cramps at night, and occasionally has to get out of bed and walk them off. She has infrequent headaches. Her chest wall is tender, but unchanged. She has no lymphedema.  A detailed review of systems today was otherwise stable.  Past Medical History  Diagnosis Date  . Numbness of feet   . GERD (gastroesophageal reflux disease)   . Chronic low back pain     "everyday"  . Eczema   . Lymphedema of arm     left  . History of radiation therapy 04/23/11 thru 06/08/11    L breast  . Breast wound     Left breast from radiation, skin graft  . PONV (postoperative nausea and vomiting)   . Migraines   . Neuromuscular disorder     raynauds  syndrome   . Shortness of breath on exertion   . Anxiety   . Breast cancer 10/23/10    s/p L mastectomy, chemo/radiation, er/pr +, Her2 -  . Breast cancer 11/10/11    S/P right mastectomy  . Allergy   . Hepatic steatosis 11/20/11    severe   . Bladder cystocele 11/20/11    low-lying ct result  . Pulmonary thromboembolism 11/20/11    ct positive acute w/i segmental branches of right lower lobe  . Pneumonia April 2013  . History of cancer chemotherapy     "memory issues from chemo"  Significant for history of migraines,  history of GERD, history of chronic constipation, history of chronic low back pain, history of tobacco abuse, the patient quitting in 1998 with approximately a 50 pack-year history prior to that.  The patient is status post tubal ligation, status post simple hysterectomy without salpingo-oophorectomy and status post cholecystectomy.  PAST SURGICAL HISTORY: Past Surgical History  Procedure Laterality Date  . Abdominal hysterectomy    . Irrigation and debridement abscess  06/16/2011    Procedure: IRRIGATION AND DEBRIDEMENT ABSCESS;  Surgeon: Brandi Bowie, MD;  Location: [facility];  Service: General;  Laterality: Left;  incision and drainage of left chest wall abcess  . Bilateral salpingoophorectomy  11/10/11    laparoscopy  . Diagnostic laparoscopy    . Mastectomy  11/10/11    right; w/SNB  . Mastectomy modified radical  10/23/11    left  . Cholecystectomy  1999  . Tubal ligation  1979  . Mastectomy w/ sentinel node biopsy  11/10/2011    Procedure: MASTECTOMY WITH SENTINEL LYMPH NODE BIOPSY;  Surgeon: Brandi Bowie, MD;  Location: [place];  Service: General;  Laterality: Right;  . Portacath placement      right subclavian  . Breast surgery Bilateral     mastectomy  . Port-a-cath removal Right 09/26/2014    Procedure: MINOR REMOVAL PORT-A-CATH;  Surgeon: Brandi Keens, MD;  Location: [place];  Service: General;  Laterality: Right;    FAMILY HISTORY Family History  Problem Relation Age of Onset  . Cancer Mother     lung  . Hypertension Mother   . Cancer Father     lung  . Cancer Brother     BRAIN CANCER  . Hypertension Brother   . Cancer Cousin      2 PATERNAL COUSINS - BREAST CA  . Hypertension Sister   . Hypertension Maternal Uncle   . Hypertension Maternal Grandmother   The patient's father died at the age of 59 and the patient's mother at the age of 36.  Both were smokers and both had lung cancer. The patient had one brother who died at age 31 with  glioblastoma multiforme.  She has one surviving brother, one surviving sister who is present today, and a half-brother.  There are two cousins, both on the father's side, who had breast cancer in their 78s.  There is one uncle with Leverne Humbles disease.  GYNECOLOGIC HISTORY: She is GX P1.  First pregnancy to term at age 41.  She never took hormone replacement.    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  Her husband, Brandi Bates, is a Geologist, engineering for an [employer].  Daughter Brandi Bates works at the same office as her mother.  Son Brandi Bates works for [employer].  The  patient has 7 grandchildren. She attends a [employer].     ADVANCED DIRECTIVES: in place  HEALTH MAINTENANCE: Social History  Substance Use Topics  . Smoking status: Former Smoker -- 2.00 packs/day for 28 years    Types: Cigarettes    Quit date: 05/31/1997  . Smokeless tobacco: Never Used  . Alcohol Use: No     Colonoscopy:  PAP: s/p hysterectomy  Bone density: SOLIS October 2012, T - 1.4 at [facility]  Lipid panel:  Allergies  Allergen Reactions  . Morphine And Related Hives and Itching    All over the body  . Omeprazole Magnesium Nausea Only  . Gabapentin Other (See Comments)    Unable to sleep  . Latex Rash    Only where touched    Current Outpatient Prescriptions  Medication Sig Dispense Refill  . aspirin 81 MG tablet Take 81 mg by mouth daily.    . B Complex Vitamins (B COMPLEX-B12) TABS Take 1 tablet by mouth daily.    . calcium-vitamin D 250-100 MG-UNIT per tablet Take 1 tablet by mouth daily.    . famotidine (PEPCID) 20 MG tablet Take 20 mg by mouth daily.    . vitamin C (ASCORBIC ACID) 500 MG tablet Take 500 mg by mouth daily.    Marland Kitchen anastrozole (ARIMIDEX) 1 MG tablet Take 1 tablet (1 mg total) by mouth daily. 30 tablet 12  . calcium carbonate (TUMS - DOSED IN MG ELEMENTAL CALCIUM) 500 MG chewable tablet Chew 1  tablet by mouth as needed. Reported on 08/06/2015    . LORazepam (ATIVAN) 0.5 MG tablet Take 1 tablet (0.5 mg total) by mouth 2 (two) times daily as needed for anxiety. (Patient not taking: Reported on 08/06/2015) 30 tablet 0  . [DISCONTINUED] gabapentin (NEURONTIN) 300 MG capsule daily.     No current facility-administered medications for this visit.    OBJECTIVE:  Middle-aged white woman in no acute distress Filed Vitals:   08/06/15 0827  BP: 145/67  Pulse: 77  Temp: 98.2 F (36.8 C)  Resp: 18     Body mass index is 30.35 kg/(m^2).    ECOG FS: 1 Filed Weights   08/06/15 0827  Weight: 176 lb 14.4 oz (80.241 kg)   Skin: warm, dry  HEENT: sclerae anicteric, conjunctivae pink, oropharynx clear. No thrush or mucositis.  Lymph Nodes: No cervical or supraclavicular lymphadenopathy  Lungs: clear to auscultation bilaterally, no rales, wheezes, or rhonci  Heart: regular rate and rhythm  Abdomen: round, soft, non tender, positive bowel sounds  Musculoskeletal: No focal spinal tenderness, no peripheral edema  Neuro: non focal, well oriented, positive affect  Breasts: bilateral breasts status post mastectomies. Left side also status post radiation and is tender to palpation. No evidence of chest wall recurrence. Bilateral axillae benign.    LAB RESULTS: Lab Results  Component Value Date   WBC 4.4 08/06/2015   NEUTROABS 3.0 08/06/2015   HGB 13.6 08/06/2015   HCT 42.1 08/06/2015   MCV 88.1 08/06/2015   PLT 170 08/06/2015      Chemistry      Component Value Date/Time   NA 143 08/06/2015 0811   NA 142 12/25/2011 1005   K 4.0 08/06/2015 0811   K 3.9 12/25/2011 1005   CL 106 10/05/2012 1234   CL 106 12/25/2011 1005   CO2 24 08/06/2015 0811   CO2 21 12/25/2011 1005   BUN 15.0 08/06/2015 0811   BUN 11 12/25/2011 1005   CREATININE 0.8 08/06/2015 0811  CREATININE 0.65 12/25/2011 1005      Component Value Date/Time   CALCIUM 9.7 08/06/2015 0811   CALCIUM 9.7 12/25/2011 1005    ALKPHOS 99 08/06/2015 0811   ALKPHOS 65 12/25/2011 1005   AST 20 08/06/2015 0811   AST 20 12/25/2011 1005   ALT 31 08/06/2015 0811   ALT 30 12/25/2011 1005   BILITOT 0.46 08/06/2015 0811   BILITOT 0.3 12/25/2011 1005        STUDIES:  No results found.  ASSESSMENT:59 y.o.  [place] woman with a BRCA mutation of unknown clinical significance  (1) status post left modified radical mastectomy March of 2012 for a T3 N3a (stage IIIC)  invasive ductal carcinoma, grade 3,  which was strongly estrogen receptor positive, progesterone receptor and HER2 negative, with an MIB-1 of 95%.   (2) s/p 4 cycles of adjuvant dose dense doxorubicin and cyclophosphamide, and 11 doses of weekly paclitaxel,   (3) s/p radiation to the left chest and regional nodes completed November 2012, at which time she started tamoxifen  (4) s/p right mastectomy with sentinel lymph node sampling 11/10/2011 for a pT1b pN0, stage IA invasive ductal carcinoma, grade 1, with insufficient tumor for estrogen and progesterone testing, but HER-2 negative.  (5) anastrozole started May 2013, interrupted June 2013, resumed July 2013  (6) post-op pulmonary embolus documented 11/20/2011, completed over 6 months of Coumadin anticoagulation  PLAN:  Aamilah is doing well as far as her breast cancer is concerned. She is now almost 4 years out from her definitive surgeries with no evidence of recurrent disease. She is tolerating the anastrozole to the best of her ability and is expected to complete 10 years of antiestrogen therapy on this drug. She will manage her hot flashes on her own and continue NSAIDs for her joint pain.   We discussed her need for a bone density scan, but for financial reasons she is uninterested. I suggested she go ahead and start a 1000 unit Vitamin D supplement in addition to the calcium she is already taking, given that she may already be osteopenic.   Lawan will return in 1 year for labs and a follow up visit.  She understands and agrees with this plan. She knows the goal of treatment in her case is cure. She has been encouraged to call with any issues that might arise before her next visit here.   Genelle Gather Boelter    08/06/2015

## 2015-08-06 NOTE — Telephone Encounter (Signed)
Appointments made and avs printed for patient °

## 2016-07-16 ENCOUNTER — Other Ambulatory Visit: Payer: Self-pay | Admitting: Nurse Practitioner

## 2016-07-16 DIAGNOSIS — C50911 Malignant neoplasm of unspecified site of right female breast: Secondary | ICD-10-CM

## 2016-07-16 DIAGNOSIS — C50419 Malignant neoplasm of upper-outer quadrant of unspecified female breast: Secondary | ICD-10-CM

## 2016-08-05 ENCOUNTER — Other Ambulatory Visit: Payer: Self-pay | Admitting: *Deleted

## 2016-08-05 DIAGNOSIS — C50911 Malignant neoplasm of unspecified site of right female breast: Secondary | ICD-10-CM

## 2016-08-06 ENCOUNTER — Other Ambulatory Visit (HOSPITAL_BASED_OUTPATIENT_CLINIC_OR_DEPARTMENT_OTHER): Payer: 59

## 2016-08-06 ENCOUNTER — Ambulatory Visit (HOSPITAL_BASED_OUTPATIENT_CLINIC_OR_DEPARTMENT_OTHER): Payer: 59 | Admitting: Oncology

## 2016-08-06 VITALS — BP 128/68 | HR 72 | Temp 98.2°F | Resp 18 | Ht 64.0 in | Wt 179.7 lb

## 2016-08-06 DIAGNOSIS — C50911 Malignant neoplasm of unspecified site of right female breast: Secondary | ICD-10-CM

## 2016-08-06 DIAGNOSIS — C50411 Malignant neoplasm of upper-outer quadrant of right female breast: Secondary | ICD-10-CM

## 2016-08-06 DIAGNOSIS — C50812 Malignant neoplasm of overlapping sites of left female breast: Secondary | ICD-10-CM

## 2016-08-06 DIAGNOSIS — Z733 Stress, not elsewhere classified: Secondary | ICD-10-CM

## 2016-08-06 DIAGNOSIS — R252 Cramp and spasm: Secondary | ICD-10-CM

## 2016-08-06 DIAGNOSIS — C50921 Malignant neoplasm of unspecified site of right male breast: Secondary | ICD-10-CM

## 2016-08-06 DIAGNOSIS — Z79811 Long term (current) use of aromatase inhibitors: Secondary | ICD-10-CM

## 2016-08-06 DIAGNOSIS — Z171 Estrogen receptor negative status [ER-]: Secondary | ICD-10-CM | POA: Diagnosis not present

## 2016-08-06 DIAGNOSIS — Z170 Estrogen receptor positive status [ER+]: Secondary | ICD-10-CM

## 2016-08-06 LAB — CBC WITH DIFFERENTIAL/PLATELET
BASO%: 0.4 % (ref 0.0–2.0)
BASOS ABS: 0 10*3/uL (ref 0.0–0.1)
EOS ABS: 0.1 10*3/uL (ref 0.0–0.5)
EOS%: 1.8 % (ref 0.0–7.0)
HEMATOCRIT: 41.6 % (ref 34.8–46.6)
HGB: 13.8 g/dL (ref 11.6–15.9)
LYMPH#: 1.1 10*3/uL (ref 0.9–3.3)
LYMPH%: 18.9 % (ref 14.0–49.7)
MCH: 28.9 pg (ref 25.1–34.0)
MCHC: 33.1 g/dL (ref 31.5–36.0)
MCV: 87.3 fL (ref 79.5–101.0)
MONO#: 0.3 10*3/uL (ref 0.1–0.9)
MONO%: 5.7 % (ref 0.0–14.0)
NEUT#: 4.3 10*3/uL (ref 1.5–6.5)
NEUT%: 73.2 % (ref 38.4–76.8)
Platelets: 197 10*3/uL (ref 145–400)
RBC: 4.77 10*6/uL (ref 3.70–5.45)
RDW: 14 % (ref 11.2–14.5)
WBC: 5.9 10*3/uL (ref 3.9–10.3)

## 2016-08-06 LAB — COMPREHENSIVE METABOLIC PANEL
ALBUMIN: 3.7 g/dL (ref 3.5–5.0)
ALK PHOS: 103 U/L (ref 40–150)
ALT: 30 U/L (ref 0–55)
ANION GAP: 9 meq/L (ref 3–11)
AST: 26 U/L (ref 5–34)
BUN: 18.4 mg/dL (ref 7.0–26.0)
CALCIUM: 9.6 mg/dL (ref 8.4–10.4)
CHLORIDE: 109 meq/L (ref 98–109)
CO2: 23 mEq/L (ref 22–29)
Creatinine: 0.7 mg/dL (ref 0.6–1.1)
EGFR: 89 mL/min/{1.73_m2} — AB (ref >90)
Glucose: 122 mg/dl (ref 70–140)
POTASSIUM: 4.3 meq/L (ref 3.5–5.1)
Sodium: 141 mEq/L (ref 136–145)
Total Bilirubin: 0.5 mg/dL (ref 0.20–1.20)
Total Protein: 7.2 g/dL (ref 6.4–8.3)

## 2016-08-06 MED ORDER — ANASTROZOLE 1 MG PO TABS: 1.0000 mg | ORAL_TABLET | Freq: Every day | ORAL | 12 refills | Status: DC

## 2016-08-06 MED ORDER — LORAZEPAM 0.5 MG PO TABS: 0.5000 mg | ORAL_TABLET | Freq: Two times a day (BID) | ORAL | 0 refills | Status: DC | PRN

## 2016-08-06 NOTE — Progress Notes (Signed)
ID: Brandi Bates   DOB: February 06, 1956  MR#: [id number]  JHE#:[id number]  PCP: [name], MD  CHIEF COMPLAINT:  bilateral breast cancers, both estrogen receptor positive  CURRENT TREATMENT: anastrozole   BREAST CANCER HISTORY: From the original intake note:  The patient felt a mass in her left axilla several months ago.  She did not bring this to her physician's attention because she wasn't sure it really meant anything, but she asked her daughter, and her daughter asked a nurse fried, and they told her she better get on and have this evaluated, so she did bring it to [name] attention and he set her up for diagnostic mammography and ultrasonography performed at [facility] on September 22, 2010.  Dr. [name] was able to demonstrate a spiculated density at the 12 o'clock position of the breast with pleomorphic calcifications measuring about 4 cm.  There was a 2nd focal rounded area of increased density and with the axilla, there was an ovoid mass measuring up to 3.8 cm. Ultrasound showed the area of heterogeneous decreased echogenicity as well as the 2nd discrete focus.  The right breast showed only a cystic mass measuring 1 cm which was of no consequence.  Biopsy was performed of the left breast mass only on the same day.  The pathology report ([id number]) biopsied 2 masses in the left breast.  The second mass was "located more superiorly" and was also identified and biopsied.  The pathology report labels both masses "at 12 o'clock" so we will have to clarify this issue.  At any rate, both masses were Grade 3 invasive ductal carcinoma.  The first one was estrogen receptor positive at 61% but progesterone receptor negative with a proliferation marker of 95%.  The second one was ER positive at 98% and PR "positive" at 3%, also with a proliferation marker of 95%.  Both showed no HER2 amplification.  With this information, the patient was referred for breast MRI. This was performed September 30, 2010 and  showed the main breast mass to measure 3.8 cm.  The posterior mass measured 6 millimeters. There were some confluent axillary masses to a maximum measurement of 6.8 cm. The patient underwent definitive surgery 10/23/2011 with results and subsequent treatment as detailed below   INTERVAL HISTORY: Brandi Bates returns today for follow up of her estrogen receptor positive breast cancer, accompanied by her husband Brandi Bates. Sharen Counter continues on anastrozole, with good tolerance.Hot flashes and vaginal dryness are not a major issue. She never developed the arthralgias or myalgias that many patients can experience on this medication. She obtains it at a good price.  REVIEW OF SYSTEMS:  July is under stress because her best friend is dying from metastatic gastrointestinal cancer and she is the one making most of the arrangements. Otherwise she continues to work full-time and is doing "good". She does not exercise. She gets cramps in her lower legs at night sometimes. She feels anxious but not depressed. She bruises easily. A detailed review of systems today was otherwise stable   Past Medical History:  Diagnosis Date  . Allergy   . Anxiety   . Bladder cystocele 11/20/11   low-lying ct result  . Breast cancer ([place]) 10/23/10   s/p L mastectomy, chemo/radiation, er/pr +, Her2 -  . Breast cancer ([place]) 11/10/11   S/P right mastectomy  . Breast wound    Left breast from radiation, skin graft  . Chronic low back pain    "everyday"  . Eczema   . GERD (gastroesophageal  reflux disease)   . Hepatic steatosis 11/20/11   severe   . History of cancer chemotherapy    "memory issues from chemo"  . History of radiation therapy 04/23/11 thru 06/08/11   L breast  . Lymphedema of arm    left  . Migraines   . Neuromuscular disorder ([name])    raynauds syndrome   . Numbness of feet   . Pneumonia April 2013  . PONV (postoperative nausea and vomiting)   . Pulmonary thromboembolism ([place]) 11/20/11   ct positive acute w/i segmental  branches of right lower lobe  . Shortness of breath on exertion   Significant for history of migraines, history of GERD, history of chronic constipation, history of chronic low back pain, history of tobacco abuse, the patient quitting in 1998 with approximately a 50 pack-year history prior to that.  The patient is status post tubal ligation, status post simple hysterectomy without salpingo-oophorectomy and status post cholecystectomy.  PAST SURGICAL HISTORY: Past Surgical History:  Procedure Laterality Date  . ABDOMINAL HYSTERECTOMY    . BILATERAL SALPINGOOPHORECTOMY  11/10/11   laparoscopy  . BREAST SURGERY Bilateral    mastectomy  . CHOLECYSTECTOMY  1999  . DIAGNOSTIC LAPAROSCOPY    . IRRIGATION AND DEBRIDEMENT ABSCESS  06/16/2011   Procedure: IRRIGATION AND DEBRIDEMENT ABSCESS;  Surgeon: Harl Bowie, MD;  Location: [facility];  Service: General;  Laterality: Left;  incision and drainage of left chest wall abcess  . MASTECTOMY  11/10/11   right; w/SNB  . MASTECTOMY MODIFIED RADICAL  10/23/11   left  . MASTECTOMY W/ SENTINEL NODE BIOPSY  11/10/2011   Procedure: MASTECTOMY WITH SENTINEL LYMPH NODE BIOPSY;  Surgeon: Harl Bowie, MD;  Location: [place];  Service: General;  Laterality: Right;  . PORT-A-CATH REMOVAL Right 09/26/2014   Procedure: MINOR REMOVAL PORT-A-CATH;  Surgeon: Coralie Keens, MD;  Location: [place];  Service: General;  Laterality: Right;  . PORTACATH PLACEMENT     right subclavian  . TUBAL LIGATION  1979    FAMILY HISTORY Family History  Problem Relation Age of Onset  . Cancer Mother     lung  . Hypertension Mother   . Cancer Father     lung  . Cancer Brother     BRAIN CANCER  . Hypertension Brother   . Cancer Cousin      2 PATERNAL COUSINS - BREAST CA  . Hypertension Sister   . Hypertension Maternal Uncle   . Hypertension Maternal Grandmother   The patient's father died at the age of 83 and the patient's mother at the age of 27.   Both were smokers and both had lung cancer. The patient had one brother who died at age 49 with glioblastoma multiforme.  She has one surviving brother, one surviving sister who is present today, and a half-brother.  There are two cousins, both on the father's side, who had breast cancer in their 82s.  There is one uncle with Leverne Humbles disease.  GYNECOLOGIC HISTORY: She is GX P1.  First pregnancy to term at age 89.  She never took hormone replacement.    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  Her husband, Brandi Bates, is a Geologist, engineering for an [employer].  Daughter Leveda Anna works at the same office as her mother.  Son Louie Casa works for [employer].  The patient has 7 grandchildren.  She attends a [employer].     ADVANCED DIRECTIVES: in place  HEALTH MAINTENANCE: Social History  Substance Use Topics  . Smoking status: Former Smoker    Packs/day: 2.00    Years: 28.00    Types: Cigarettes    Quit date: 05/31/1997  . Smokeless tobacco: Never Used  . Alcohol use No     Colonoscopy:  PAP: s/p hysterectomy  Bone density: SOLIS October 2012, T - 1.4 [facility]  Lipid panel:  Allergies  Allergen Reactions  . Morphine And Related Hives and Itching    All over the body  . Omeprazole Magnesium Nausea Only  . Gabapentin Other (See Comments)    Unable to sleep  . Latex Rash    Only where touched    Current Outpatient Prescriptions  Medication Sig Dispense Refill  . LORazepam (ATIVAN) 0.5 MG tablet Take 1 tablet (0.5 mg total) by mouth 2 (two) times daily as needed for anxiety. 30 tablet 0  . anastrozole (ARIMIDEX) 1 MG tablet Take 1 tablet (1 mg total) by mouth daily. 30 tablet 12  . aspirin 81 MG tablet Take 81 mg by mouth daily.    . B Complex Vitamins (B COMPLEX-B12) TABS Take 1 tablet by mouth daily.    . calcium carbonate (TUMS - DOSED IN MG ELEMENTAL CALCIUM) 500 MG chewable tablet  Chew 1 tablet by mouth as needed. Reported on 08/06/2015    . calcium-vitamin D 250-100 MG-UNIT per tablet Take 1 tablet by mouth daily.    . famotidine (PEPCID) 20 MG tablet Take 20 mg by mouth daily.    . vitamin C (ASCORBIC ACID) 500 MG tablet Take 500 mg by mouth daily.     No current facility-administered medications for this visit.     OBJECTIVE:  Middle-aged white woman Who appears stated age 61:   08/06/16 0939  BP: 128/68  Pulse: 72  Resp: 18  Temp: 98.2 F (36.8 C)     Body mass index is 30.85 kg/m.    ECOG FS: 1 Filed Weights   08/06/16 0939  Weight: 179 lb 11.2 oz (81.5 kg)   Sclerae unicteric, pupils round and equal Oropharynx clear and moist-- no thrush or other lesions No cervical or supraclavicular adenopathy Lungs no rales or rhonchi Heart regular rate and rhythm Abd soft, nontender, positive bowel sounds MSK no focal spinal tenderness, no upper extremity lymphedema Neuro: nonfocal, well oriented, appropriate affect Breasts: Status post bilateral mastectomies. There is no evidence of local recurrence. Both axillae are benign.  LAB RESULTS: Lab Results  Component Value Date   WBC 5.9 08/06/2016   NEUTROABS 4.3 08/06/2016   HGB 13.8 08/06/2016   HCT 41.6 08/06/2016   MCV 87.3 08/06/2016   PLT 197 08/06/2016      Chemistry      Component Value Date/Time   NA 141 08/06/2016 0916   K 4.3 08/06/2016 0916   CL 106 10/05/2012 1234   CO2 23 08/06/2016 0916   BUN 18.4 08/06/2016 0916   CREATININE 0.7 08/06/2016 0916      Component Value Date/Time   CALCIUM 9.6 08/06/2016 0916   ALKPHOS 103 08/06/2016 0916   AST 26 08/06/2016 0916   ALT 30 08/06/2016 0916   BILITOT 0.50 08/06/2016 0916        STUDIES:  No results found.  ASSESSMENT:60 y.o.  [place] woman with a BRCA mutation of unknown clinical significance  (1) status post left modified radical mastectomy March of 2012 for  a T3 N3a (stage IIIC)  invasive ductal carcinoma, grade 3,   which was strongly estrogen receptor positive, progesterone receptor and HER2 negative, with an MIB-1 of 95%.   (2) s/p 4 cycles of adjuvant dose dense doxorubicin and cyclophosphamide, and 11 doses of weekly paclitaxel,   (3) s/p radiation to the left chest and regional nodes completed November 2012, at which time she started tamoxifen  (4) s/p right mastectomy with sentinel lymph node sampling 11/10/2011 for a pT1b pN0, stage IA invasive ductal carcinoma, grade 1, with insufficient tumor for estrogen and progesterone testing, but HER-2 negative.  (5) anastrozole started May 2013, interrupted June 2013, resumed July 2013  (6) post-op pulmonary embolus documented 11/20/2011, completed over 6 months of Coumadin anticoagulation  PLAN:  Dakari is now 4-1/2 years out from her most recent surgery for breast cancer, with no evidence of disease recurrence. This is very favorable area  She was on tamoxifen for 1 year before starting anastrozole. She has been on anastrozole for any half years. The plan is to continue anastrozole for total of 7 years based on the most recent data from [facility]. She is agreeable to this plan.  She requested a refill on her lorazepam because of the stress she is experiencing secondary to her friends upcoming death. I was glad to refill that for her.   I don't think her cramps are going to be related to the anastrozole. She had rates herself fairly well. We talked about potassium issues. She will try a little stretching before bedtime.  She will see me again in October. She knows to call for any problems that may develop before that visit. Holly Pring C    08/06/2016  Yes 1 anyway today makes sense

## 2016-09-22 DIAGNOSIS — H26493 Other secondary cataract, bilateral: Secondary | ICD-10-CM | POA: Diagnosis not present

## 2016-09-22 DIAGNOSIS — H40013 Open angle with borderline findings, low risk, bilateral: Secondary | ICD-10-CM | POA: Diagnosis not present

## 2016-09-22 DIAGNOSIS — H40053 Ocular hypertension, bilateral: Secondary | ICD-10-CM | POA: Diagnosis not present

## 2016-11-06 DIAGNOSIS — C50912 Malignant neoplasm of unspecified site of left female breast: Secondary | ICD-10-CM | POA: Diagnosis not present

## 2016-11-06 DIAGNOSIS — C50911 Malignant neoplasm of unspecified site of right female breast: Secondary | ICD-10-CM | POA: Diagnosis not present

## 2016-12-18 DIAGNOSIS — C50911 Malignant neoplasm of unspecified site of right female breast: Secondary | ICD-10-CM | POA: Diagnosis not present

## 2016-12-18 DIAGNOSIS — C50912 Malignant neoplasm of unspecified site of left female breast: Secondary | ICD-10-CM | POA: Diagnosis not present

## 2016-12-29 ENCOUNTER — Telehealth: Payer: Self-pay | Admitting: *Deleted

## 2016-12-29 NOTE — Telephone Encounter (Signed)
Message left by pt stating " I am wanting to schedule a colonoscopy and a bone scan - "  Return call number left as [phone].  This RN returned call and obtained identified VM- message left informing pt this office does not schedule colonoscopies and need to discuss her request further.  Requested return call.

## 2017-01-08 ENCOUNTER — Telehealth: Payer: Self-pay

## 2017-01-08 ENCOUNTER — Encounter: Payer: Self-pay | Admitting: Gastroenterology

## 2017-01-08 DIAGNOSIS — C50411 Malignant neoplasm of upper-outer quadrant of right female breast: Secondary | ICD-10-CM

## 2017-01-08 NOTE — Telephone Encounter (Signed)
Pt called for referral to GI for colonoscopy and for bone density.  [facility]. She requests [facility] on [address]. Her PCP is Leighton Ruff with [facility] so requested she contact PCP to make GI referral. This office will help if there is a problem.   Pt called back. She has not seen PCP so long she will have to be re-established as a new pt. She does have and appt in July. She is requesting referral for colonoscopy b/c she has family mambers with GI cancer and she has never had a colonscopy. S/w Dr Jana Hakim and he OK referral to Dr Havery Moros with [facility].  Referral placed to [facility] Dr Jana Hakim also OKd bone density testing and order placed. Pt will call the breast center for scheduling.

## 2017-01-21 DIAGNOSIS — H00024 Hordeolum internum left upper eyelid: Secondary | ICD-10-CM | POA: Diagnosis not present

## 2017-01-21 DIAGNOSIS — H00025 Hordeolum internum left lower eyelid: Secondary | ICD-10-CM | POA: Diagnosis not present

## 2017-01-21 DIAGNOSIS — H11123 Conjunctival concretions, bilateral: Secondary | ICD-10-CM | POA: Diagnosis not present

## 2017-01-29 ENCOUNTER — Ambulatory Visit (AMBULATORY_SURGERY_CENTER): Payer: Self-pay | Admitting: *Deleted

## 2017-01-29 VITALS — Ht 64.0 in | Wt 178.0 lb

## 2017-01-29 DIAGNOSIS — Z1211 Encounter for screening for malignant neoplasm of colon: Secondary | ICD-10-CM

## 2017-01-29 MED ORDER — NA SULFATE-K SULFATE-MG SULF 17.5-3.13-1.6 GM/177ML PO SOLN: ORAL | 0 refills | Status: DC

## 2017-01-29 NOTE — Progress Notes (Addendum)
Patient denies any allergies to eggs or soy. Post-op n&v. Patient denies any oxygen use at home and does not take any diet/weight loss medications. EMMI education assisgned to patient on colonoscopy, this was explained and instructions given to patient.

## 2017-02-01 ENCOUNTER — Encounter: Payer: Self-pay | Admitting: Gastroenterology

## 2017-02-01 DIAGNOSIS — L309 Dermatitis, unspecified: Secondary | ICD-10-CM | POA: Diagnosis not present

## 2017-02-01 DIAGNOSIS — L821 Other seborrheic keratosis: Secondary | ICD-10-CM | POA: Diagnosis not present

## 2017-02-01 DIAGNOSIS — M722 Plantar fascial fibromatosis: Secondary | ICD-10-CM | POA: Diagnosis not present

## 2017-02-10 ENCOUNTER — Ambulatory Visit (AMBULATORY_SURGERY_CENTER): Payer: 59 | Admitting: Gastroenterology

## 2017-02-10 ENCOUNTER — Encounter: Payer: Self-pay | Admitting: Gastroenterology

## 2017-02-10 VITALS — BP 99/51 | HR 62 | Temp 98.7°F | Resp 9 | Ht 64.0 in | Wt 178.0 lb

## 2017-02-10 DIAGNOSIS — Z1211 Encounter for screening for malignant neoplasm of colon: Secondary | ICD-10-CM | POA: Diagnosis present

## 2017-02-10 DIAGNOSIS — R0602 Shortness of breath: Secondary | ICD-10-CM | POA: Diagnosis not present

## 2017-02-10 DIAGNOSIS — Z1212 Encounter for screening for malignant neoplasm of rectum: Secondary | ICD-10-CM | POA: Diagnosis not present

## 2017-02-10 MED ORDER — SODIUM CHLORIDE 0.9 % IV SOLN: 500.0000 mL | INTRAVENOUS | Status: DC

## 2017-02-10 NOTE — Op Note (Signed)
[place] Patient Name: Brandi Bates Procedure Date: 02/10/2017 3:38 PM MRN: [id number] Endoscopist: Mauri Pole , MD Age: 61 Referring MD:  Date of Birth: 1955-08-26 Gender: Female Account #: [id number] Procedure:                Colonoscopy Indications:              Screening for colorectal malignant neoplasm Medicines:                Monitored Anesthesia Care Procedure:                Pre-Anesthesia Assessment:                           - Prior to the procedure, a History and Physical                            was performed, and patient medications and                            allergies were reviewed. The patient's tolerance of                            previous anesthesia was also reviewed. The risks                            and benefits of the procedure and the sedation                            options and risks were discussed with the patient.                            All questions were answered, and informed consent                            was obtained. Prior Anticoagulants: The patient has                            taken no previous anticoagulant or antiplatelet                            agents. ASA Grade Assessment: II - A patient with                            mild systemic disease. After reviewing the risks                            and benefits, the patient was deemed in                            satisfactory condition to undergo the procedure.                           After obtaining informed consent, the colonoscope  was passed under direct vision. Throughout the                            procedure, the patient's blood pressure, pulse, and                            oxygen saturations were monitored continuously. The                            Colonoscope was introduced through the anus and                            advanced to the the terminal ileum, with                            identification of the  appendiceal orifice. The                            colonoscopy was performed without difficulty. The                            patient tolerated the procedure well. The quality                            of the bowel preparation was good. The terminal                            ileum, the appendiceal orifice and the rectum were                            photographed. Scope In: 3:44:10 PM Scope Out: 3:59:12 PM Scope Withdrawal Time: 0 hours 10 minutes 26 seconds  Total Procedure Duration: 0 hours 15 minutes 2 seconds  Findings:                 The perianal and digital rectal examinations were                            normal.                           Non-bleeding internal hemorrhoids were found during                            retroflexion. The hemorrhoids were small.                           The exam was otherwise without abnormality. Complications:            No immediate complications. Estimated Blood Loss:     Estimated blood loss: none. Impression:               - Non-bleeding internal hemorrhoids.                           - The examination was otherwise normal.                           -  No specimens collected. Recommendation:           - Patient has a contact number available for                            emergencies. The signs and symptoms of potential                            delayed complications were discussed with the                            patient. Return to normal activities tomorrow.                            Written discharge instructions were provided to the                            patient.                           - Resume previous diet.                           - Continue present medications.                           - Repeat colonoscopy in 10 years for screening                            purposes. Mauri Pole, MD 02/10/2017 4:03:19 PM This report has been signed electronically.

## 2017-02-10 NOTE — Patient Instructions (Signed)

## 2017-02-10 NOTE — Progress Notes (Signed)
To PACU, vss patent aw report to rn 

## 2017-02-11 ENCOUNTER — Telehealth: Payer: Self-pay | Admitting: *Deleted

## 2017-02-11 NOTE — Telephone Encounter (Signed)
  Follow up Call-  Call back number 02/10/2017  Post procedure Call Back phone  # [phone]  Permission to leave phone message Yes  Some recent data might be hidden     Patient questions:  Do you have a fever, pain , or abdominal swelling? No. Pain Score  0 *  Have you tolerated food without any problems? Yes.    Have you been able to return to your normal activities? Yes.    Do you have any questions about your discharge instructions: Diet   No. Medications  No. Follow up visit  No.  Do you have questions or concerns about your Care? No.  Actions: * If pain score is 4 or above: No action needed, pain <4.

## 2017-02-12 ENCOUNTER — Encounter: Payer: 59 | Admitting: Gastroenterology

## 2017-02-12 DIAGNOSIS — M8589 Other specified disorders of bone density and structure, multiple sites: Secondary | ICD-10-CM | POA: Diagnosis not present

## 2017-02-12 DIAGNOSIS — M810 Age-related osteoporosis without current pathological fracture: Secondary | ICD-10-CM | POA: Diagnosis not present

## 2017-05-27 NOTE — Progress Notes (Signed)
[place]  [phone] [phone]    ID: Brandi Bates   DOB: [birth year]  MR#: [id number]  IWP#:[id number]  PCP: Brandi Ruff, MD  CHIEF COMPLAINT:  bilateral breast cancers, both estrogen receptor positive  CURRENT TREATMENT: anastrozole   BREAST CANCER HISTORY: From the original intake note:  The patient felt a mass in her left axilla several months ago.  She did not bring this to her physician's attention because she wasn't sure it really meant anything, but she asked her daughter, and her daughter asked a nurse fried, and they told her she better get on and have this evaluated, so she did bring it to Brandi Bates attention and he set her up for diagnostic mammography and ultrasonography performed at [facility] on September 22, 2010.  Brandi Bates was able to demonstrate a spiculated density at the 12 o'clock position of the breast with pleomorphic calcifications measuring about 4 cm.  There was a 2nd focal rounded area of increased density and with the axilla, there was an ovoid mass measuring up to 3.8 cm. Ultrasound showed the area of heterogeneous decreased echogenicity as well as the 2nd discrete focus.  The right breast showed only a cystic mass measuring 1 cm which was of no consequence.  Biopsy was performed of the left breast mass only on the same day.  The pathology report ([id number]) biopsied 2 masses in the left breast.  The second mass was "located more superiorly" and was also identified and biopsied.  The pathology report labels both masses "at 12 o'clock" so we will have to clarify this issue.  At any rate, both masses were Grade 3 invasive ductal carcinoma.  The first one was estrogen receptor positive at 61% but progesterone receptor negative with a proliferation marker of 95%.  The second one was ER positive at 98% and PR "positive" at 3%, also with a proliferation marker of 95%.  Both showed no HER2 amplification.  With this information, the  patient was referred for breast MRI. This was performed September 30, 2010 and showed the main breast mass to measure 3.8 cm.  The posterior mass measured 6 millimeters. There were some confluent axillary masses to a maximum measurement of 6.8 cm. The patient underwent definitive surgery 10/23/2011 with results and subsequent treatment as detailed below   INTERVAL HISTORY: Brandi Bates returns today for follow-up and treatment of her estrogen receptor positive breast cancer. She is accompanied by her husband, Brandi Bates. She continues on anastrozole, and reports moderate to severe hot flashes, myalgias and arthralgias.   REVIEW OF SYSTEMS:  Brandi Bates reports increased, constant chronic pain in her lower back and increased SOB. She notices SOB with movement. She walks around the block at lunch during the week and notes that she will be fatigued afterwards. On the weekends she doesn't do a lot because she is very tired. She reports about 4 deaths in the last year and it has caused her a lot of stress. She denies unusual headaches, visual changes, nausea, vomiting, or dizziness. There has been no unusual cough, phlegm production, or pleurisy. This been no change in bowel or bladder habits. She denies unexplained fatigue or unexplained weight loss, bleeding, rash, or fever. A detailed review of systems was otherwise entirely stable.    Past Medical History:  Diagnosis Date  . Allergy   . Anxiety   . Bladder cystocele 11/20/11   low-lying ct result  . Breast cancer ([place]) 10/23/10   s/p L mastectomy, chemo/radiation, er/pr +,  Her2 -  . Breast cancer ([place]) 11/10/11   S/P right mastectomy  . Breast wound    Left breast from radiation, skin graft  . Chronic low back pain    "everyday"  . Eczema   . GERD (gastroesophageal reflux disease)   . Hepatic steatosis 11/20/11   severe   . History of cancer chemotherapy    "memory issues from chemo"  . History of radiation therapy 04/23/11 thru 06/08/11   L breast  . Lymphedema  of arm    left  . Migraines   . Neuromuscular disorder ([place])    raynauds syndrome   . Numbness of feet   . Pneumonia April 2013  . PONV (postoperative nausea and vomiting)   . Pulmonary thromboembolism ([place]) 11/20/11   ct positive acute w/i segmental branches of right lower lobe  . Shortness of breath on exertion   Significant for history of migraines, history of GERD, history of chronic constipation, history of chronic low back pain, history of tobacco abuse, the patient quitting in 1998 with approximately a 50 pack-year history prior to that.  The patient is status post tubal ligation, status post simple hysterectomy without salpingo-oophorectomy and status post cholecystectomy.  PAST SURGICAL HISTORY: Past Surgical History:  Procedure Laterality Date  . ABDOMINAL HYSTERECTOMY    . BILATERAL SALPINGOOPHORECTOMY  11/10/11   laparoscopy  . BREAST SURGERY Bilateral    mastectomy  . CHOLECYSTECTOMY  1999  . DIAGNOSTIC LAPAROSCOPY    . MASTECTOMY  11/10/11   right; w/SNB  . MASTECTOMY MODIFIED RADICAL  10/23/11   left  . PORTACATH PLACEMENT     right subclavian  . TUBAL LIGATION  1979    FAMILY HISTORY Family History  Problem Relation Age of Onset  . Cancer Mother        lung  . Hypertension Mother   . Cancer Father        lung  . Cancer Brother        BRAIN CANCER  . Hypertension Brother   . Cancer Cousin         2 PATERNAL COUSINS - BREAST CA  . Hypertension Sister   . Hypertension Maternal Uncle   . Hypertension Maternal Grandmother   . Colon cancer Other 49       colon METS to liver  The patient's father died at the age of 31 and the patient's mother at the age of 2.  Both were smokers and both had lung cancer. The patient had one brother who died at age 79 with glioblastoma multiforme.  She has one surviving brother, one surviving sister who is present today, and a half-brother.  There are two cousins, both on the father's side, who had breast cancer in their 83s.   There is one uncle with Leverne Humbles disease.  GYNECOLOGIC HISTORY: She is GX P1.  First pregnancy to term at age 35.  She never took hormone replacement.    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  Her husband, Brandi Bates, is a Geologist, engineering for an [employer].  Daughter Leveda Anna works at the same office as her mother.  Son Louie Casa works for [employer].  The patient has 7 grandchildren. She attends a [employer].     ADVANCED DIRECTIVES: in place  HEALTH MAINTENANCE: Social History   Tobacco Use  . Smoking status: Former Smoker  Packs/day: 2.00    Years: 28.00    Pack years: 56.00    Types: Cigarettes    Last attempt to quit: 05/31/1997    Years since quitting: 20.0  . Smokeless tobacco: Never Used  Substance Use Topics  . Alcohol use: No  . Drug use: No     Colonoscopy:  PAP: s/p hysterectomy  Bone density: SOLIS October 2012, T - 1.4 at [facility]  Lipid panel:  Allergies  Allergen Reactions  . Morphine And Related Hives and Itching    All over the body  . Omeprazole Magnesium Nausea Only  . Gabapentin Other (See Comments)    Unable to sleep  . Prilosec [Omeprazole] Nausea Only  . Latex Rash    Only where touched    Current Outpatient Medications  Medication Sig Dispense Refill  . anastrozole (ARIMIDEX) 1 MG tablet Take 1 tablet (1 mg total) by mouth daily. 30 tablet 12  . aspirin 81 MG tablet Take 81 mg by mouth daily.    . B Complex Vitamins (B COMPLEX-B12) TABS Take 1 tablet by mouth daily.    . calcium carbonate (TUMS - DOSED IN MG ELEMENTAL CALCIUM) 500 MG chewable tablet Chew 1 tablet by mouth as needed. Reported on 08/06/2015    . calcium-vitamin D 250-100 MG-UNIT per tablet Take 1 tablet by mouth daily.    . famotidine (PEPCID) 20 MG tablet Take 20 mg by mouth daily.    Marland Kitchen LORazepam (ATIVAN) 0.5 MG tablet Take 1 tablet (0.5 mg total) by mouth 2 (two) times daily  as needed for anxiety. 30 tablet 0  . Potassium 99 MG TABS Take daily by mouth.    . vitamin C (ASCORBIC ACID) 500 MG tablet Take 500 mg by mouth daily.     Current Facility-Administered Medications  Medication Dose Route Frequency Provider Last Rate Last Dose  . 0.9 %  sodium chloride infusion  500 mL Intravenous Continuous Nandigam, Venia Minks, MD        OBJECTIVE:  Middle-aged white woman who was tearful during today's visit Vitals:   06/03/17 0859  BP: (!) 145/75  Pulse: 78  Resp: 18  Temp: 98.4 F (36.9 C)  SpO2: 97%     Body mass index is 31.22 kg/m.    ECOG FS: 1 Filed Weights   06/03/17 0859  Weight: 181 lb 14.4 oz (82.5 kg)   Sclerae unicteric, EOMs intact Oropharynx clear and moist No cervical or supraclavicular adenopathy Lungs no rales or rhonchi Heart regular rate and rhythm Abd soft, nontender, positive bowel sounds MSK mild lumbar spinal tenderness, no upper extremity lymphedema Neuro: nonfocal, well oriented, appropriate affect Breasts: Status post bilateral mastectomy; there is no evidence of chest wall recurrence; both axillae are benign.   LAB RESULTS: Lab Results  Component Value Date   WBC 4.4 06/03/2017   NEUTROABS 2.8 06/03/2017   HGB 13.5 06/03/2017   HCT 41.2 06/03/2017   MCV 89.8 06/03/2017   PLT 201 06/03/2017      Chemistry      Component Value Date/Time   NA 141 08/06/2016 0916   K 4.3 08/06/2016 0916   CL 106 10/05/2012 1234   CO2 23 08/06/2016 0916   BUN 18.4 08/06/2016 0916   CREATININE 0.7 08/06/2016 0916      Component Value Date/Time   CALCIUM 9.6 08/06/2016 0916   ALKPHOS 103 08/06/2016 0916   AST 26 08/06/2016 0916   ALT 30 08/06/2016 0916   BILITOT 0.50 08/06/2016 0916  STUDIES: No results found.   ASSESSMENT:60 y.o.  [place] woman with a BRCA mutation of unknown clinical significance  (1) status post left modified radical mastectomy March of 2012 for a T3 N3a (stage IIIC)  invasive ductal carcinoma,  grade 3,  which was strongly estrogen receptor positive, progesterone receptor and HER2 negative, with an MIB-1 of 95%.   (2) s/p 4 cycles of adjuvant dose dense doxorubicin and cyclophosphamide, and 11 doses of weekly paclitaxel,   (3) s/p radiation to the left chest and regional nodes completed November 2012, at which time she started tamoxifen  (4) s/p right mastectomy with sentinel lymph node sampling 11/10/2011 for a pT1b pN0, stage IA invasive ductal carcinoma, grade 1, with insufficient tumor for estrogen and progesterone testing, but HER-2 negative.  (5) anastrozole started May 2013, interrupted June 2013, resumed July 2013  (6) post-op pulmonary embolus documented 11/20/2011, completed over 6 months of Coumadin anticoagulation  PLAN:  Shandreka is now 5-1/2 years out from definitive surgery for her locally advanced breast cancer, with no evidence of disease recurrence.  This is favorable.  I think her low back pain is going to be related to arthritis but we are going to obtain plain films to evaluate that.  I think her shortness of breath is going to be due to deconditioning.  However I think it is important to obtain a CT of the chest at this point to make sure were not dealing with any thing else unusual.  Recall she did have a remote pulmonary embolus.  I do not pick up any symptoms suggestive of that problem at this point.  She is clearly depressed from the several deaths in her family, 3 of the 4 of which have been due to cancer.  She is understandably concerned about her own cancer.  I think these tests will be reassuring to her  In addition I am starting her on venlafaxine 75 mg daily.  I think this will help with the depressive portion and not keep her from grieving appropriately.  It will also help with hot flashes.  Assuming all goes well she will see me again one more time a year from now at which point she will "get out of the cancer business". Magrinat, Virgie Dad, MD   06/03/17 9:31 AM Medical Oncology and Hematology [facility] [address] [place],  [address] Tel. [phone]    Fax. [phone]  This document serves as a record of services personally performed by Chauncey Cruel, MD. It was created on his behalf by Margit Banda, a trained medical scribe. The creation of this record is based on the scribe's personal observations and the provider's statements to them.  I have reviewed the above documentation for accuracy and completeness, and I agree with the above.

## 2017-06-02 ENCOUNTER — Other Ambulatory Visit: Payer: Self-pay

## 2017-06-02 DIAGNOSIS — Z170 Estrogen receptor positive status [ER+]: Principal | ICD-10-CM

## 2017-06-02 DIAGNOSIS — C50411 Malignant neoplasm of upper-outer quadrant of right female breast: Secondary | ICD-10-CM

## 2017-06-03 ENCOUNTER — Ambulatory Visit (HOSPITAL_BASED_OUTPATIENT_CLINIC_OR_DEPARTMENT_OTHER): Payer: 59 | Admitting: Oncology

## 2017-06-03 ENCOUNTER — Other Ambulatory Visit (HOSPITAL_BASED_OUTPATIENT_CLINIC_OR_DEPARTMENT_OTHER): Payer: 59

## 2017-06-03 ENCOUNTER — Telehealth: Payer: Self-pay | Admitting: Oncology

## 2017-06-03 VITALS — BP 145/75 | HR 78 | Temp 98.4°F | Resp 18 | Ht 64.0 in | Wt 181.9 lb

## 2017-06-03 DIAGNOSIS — C50411 Malignant neoplasm of upper-outer quadrant of right female breast: Secondary | ICD-10-CM

## 2017-06-03 DIAGNOSIS — R079 Chest pain, unspecified: Secondary | ICD-10-CM

## 2017-06-03 DIAGNOSIS — C50812 Malignant neoplasm of overlapping sites of left female breast: Secondary | ICD-10-CM | POA: Diagnosis not present

## 2017-06-03 DIAGNOSIS — M1990 Unspecified osteoarthritis, unspecified site: Secondary | ICD-10-CM | POA: Diagnosis not present

## 2017-06-03 DIAGNOSIS — Z79811 Long term (current) use of aromatase inhibitors: Secondary | ICD-10-CM | POA: Diagnosis not present

## 2017-06-03 DIAGNOSIS — Z170 Estrogen receptor positive status [ER+]: Secondary | ICD-10-CM

## 2017-06-03 DIAGNOSIS — Z86711 Personal history of pulmonary embolism: Secondary | ICD-10-CM

## 2017-06-03 DIAGNOSIS — C50921 Malignant neoplasm of unspecified site of right male breast: Secondary | ICD-10-CM

## 2017-06-03 DIAGNOSIS — M545 Low back pain: Secondary | ICD-10-CM

## 2017-06-03 LAB — COMPREHENSIVE METABOLIC PANEL
ALBUMIN: 3.8 g/dL (ref 3.5–5.0)
ALK PHOS: 84 U/L (ref 40–150)
ALT: 32 U/L (ref 0–55)
AST: 19 U/L (ref 5–34)
Anion Gap: 11 mEq/L (ref 3–11)
BILIRUBIN TOTAL: 0.49 mg/dL (ref 0.20–1.20)
BUN: 17 mg/dL (ref 7.0–26.0)
CO2: 24 meq/L (ref 22–29)
CREATININE: 0.7 mg/dL (ref 0.6–1.1)
Calcium: 9.8 mg/dL (ref 8.4–10.4)
Chloride: 108 mEq/L (ref 98–109)
GLUCOSE: 101 mg/dL (ref 70–140)
Potassium: 3.6 mEq/L (ref 3.5–5.1)
SODIUM: 143 meq/L (ref 136–145)
TOTAL PROTEIN: 7.3 g/dL (ref 6.4–8.3)

## 2017-06-03 LAB — CBC WITH DIFFERENTIAL/PLATELET
BASO%: 0.2 % (ref 0.0–2.0)
BASOS ABS: 0 10*3/uL (ref 0.0–0.1)
EOS ABS: 0.1 10*3/uL (ref 0.0–0.5)
EOS%: 3.2 % (ref 0.0–7.0)
HCT: 41.2 % (ref 34.8–46.6)
HEMOGLOBIN: 13.5 g/dL (ref 11.6–15.9)
LYMPH%: 27.2 % (ref 14.0–49.7)
MCH: 29.4 pg (ref 25.1–34.0)
MCHC: 32.8 g/dL (ref 31.5–36.0)
MCV: 89.8 fL (ref 79.5–101.0)
MONO#: 0.2 10*3/uL (ref 0.1–0.9)
MONO%: 5.4 % (ref 0.0–14.0)
NEUT#: 2.8 10*3/uL (ref 1.5–6.5)
NEUT%: 64 % (ref 38.4–76.8)
Platelets: 201 10*3/uL (ref 145–400)
RBC: 4.59 10*6/uL (ref 3.70–5.45)
RDW: 13.6 % (ref 11.2–14.5)
WBC: 4.4 10*3/uL (ref 3.9–10.3)
lymph#: 1.2 10*3/uL (ref 0.9–3.3)

## 2017-06-03 LAB — DRAW EXTRA CLOT TUBE

## 2017-06-03 MED ORDER — VENLAFAXINE HCL ER 75 MG PO CP24: 75.0000 mg | ORAL_CAPSULE | Freq: Every day | ORAL | 6 refills | Status: DC

## 2017-06-03 MED ORDER — LORAZEPAM 0.5 MG PO TABS: 0.5000 mg | ORAL_TABLET | Freq: Two times a day (BID) | ORAL | 0 refills | Status: DC | PRN

## 2017-06-03 MED ORDER — ANASTROZOLE 1 MG PO TABS: 1.0000 mg | ORAL_TABLET | Freq: Every day | ORAL | 12 refills | Status: DC

## 2017-06-03 NOTE — Telephone Encounter (Signed)
Gave patient avs report and appointments for October and November 2019. Central radiology will call re scan. Per patient she will have xray done tomorrow after work.

## 2017-06-04 ENCOUNTER — Ambulatory Visit (HOSPITAL_COMMUNITY)
Admission: RE | Admit: 2017-06-04 | Discharge: 2017-06-04 | Disposition: A | Discharge status: Home / Self Care | Payer: 59 | Source: Ambulatory Visit | Attending: Oncology | Admitting: Oncology

## 2017-06-04 DIAGNOSIS — Z170 Estrogen receptor positive status [ER+]: Secondary | ICD-10-CM | POA: Diagnosis not present

## 2017-06-04 DIAGNOSIS — S3992XA Unspecified injury of lower back, initial encounter: Secondary | ICD-10-CM | POA: Diagnosis not present

## 2017-06-04 DIAGNOSIS — C50411 Malignant neoplasm of upper-outer quadrant of right female breast: Secondary | ICD-10-CM

## 2017-06-04 DIAGNOSIS — M545 Low back pain: Secondary | ICD-10-CM | POA: Diagnosis not present

## 2017-06-07 ENCOUNTER — Telehealth: Payer: Self-pay | Admitting: *Deleted

## 2017-06-07 NOTE — Telephone Encounter (Signed)
Brandi Bates called to state she was started on an anti depressant per visit last week - " and ever since I started the medication I feel so sleepy but am quivering on the inside "  Pt started the medication on 06/04/2017. She states she is drowsy - but does not sleep well or peacefully- she does not feel rested upon waking.  Per " quivering on the inside " - she describes as a " nervous - anxious feeling "- So even though she is sleepy mentally she feels a nervous anxiety " like I do not want to sit still ".  " I wasn't feeling like this until I started the medication "  Per discussion - Brandi Bates states she did not take dose today.  This RN stated above appropriate- and to allow 24-48 hours for symptoms to improve.  Plan is for pt and this RN to follow up in 2 days for further assessment and recommendations.

## 2017-06-10 ENCOUNTER — Ambulatory Visit (HOSPITAL_COMMUNITY)
Admission: RE | Admit: 2017-06-10 | Discharge: 2017-06-10 | Disposition: A | Discharge status: Home / Self Care | Payer: 59 | Source: Ambulatory Visit | Attending: Oncology | Admitting: Oncology

## 2017-06-10 ENCOUNTER — Encounter (HOSPITAL_COMMUNITY): Payer: Self-pay

## 2017-06-10 DIAGNOSIS — C50411 Malignant neoplasm of upper-outer quadrant of right female breast: Secondary | ICD-10-CM | POA: Insufficient documentation

## 2017-06-10 DIAGNOSIS — R918 Other nonspecific abnormal finding of lung field: Secondary | ICD-10-CM | POA: Diagnosis not present

## 2017-06-10 DIAGNOSIS — Z170 Estrogen receptor positive status [ER+]: Secondary | ICD-10-CM | POA: Insufficient documentation

## 2017-06-10 MED ORDER — IOPAMIDOL (ISOVUE-300) INJECTION 61%
75.0000 mL | Freq: Once | INTRAVENOUS | Status: AC | PRN
  Administered 2017-06-10: 75 mL via INTRAVENOUS

## 2017-06-10 MED ORDER — IOPAMIDOL (ISOVUE-300) INJECTION 61%
INTRAVENOUS | Status: AC
  Filled 2017-06-10: qty 75

## 2017-06-12 ENCOUNTER — Other Ambulatory Visit: Payer: Self-pay | Admitting: Oncology

## 2017-06-12 NOTE — Progress Notes (Signed)
I called Brandi Bates and left a voicemail on her cell phone letting her know the CT scan did not show any evidence of cancer.  It looks like she had a viral infection which is clearing and that may account for her shortness of breath.

## 2017-08-26 DIAGNOSIS — J209 Acute bronchitis, unspecified: Secondary | ICD-10-CM | POA: Diagnosis not present

## 2017-08-26 DIAGNOSIS — J111 Influenza due to unidentified influenza virus with other respiratory manifestations: Secondary | ICD-10-CM | POA: Diagnosis not present

## 2017-08-26 DIAGNOSIS — J0100 Acute maxillary sinusitis, unspecified: Secondary | ICD-10-CM | POA: Diagnosis not present

## 2017-10-21 DIAGNOSIS — H04123 Dry eye syndrome of bilateral lacrimal glands: Secondary | ICD-10-CM | POA: Diagnosis not present

## 2017-10-21 DIAGNOSIS — H40011 Open angle with borderline findings, low risk, right eye: Secondary | ICD-10-CM | POA: Diagnosis not present

## 2017-10-21 DIAGNOSIS — H1045 Other chronic allergic conjunctivitis: Secondary | ICD-10-CM | POA: Diagnosis not present

## 2017-12-17 DIAGNOSIS — C50911 Malignant neoplasm of unspecified site of right female breast: Secondary | ICD-10-CM | POA: Diagnosis not present

## 2017-12-17 DIAGNOSIS — C50912 Malignant neoplasm of unspecified site of left female breast: Secondary | ICD-10-CM | POA: Diagnosis not present

## 2017-12-23 DIAGNOSIS — C50911 Malignant neoplasm of unspecified site of right female breast: Secondary | ICD-10-CM | POA: Diagnosis not present

## 2017-12-23 DIAGNOSIS — C50912 Malignant neoplasm of unspecified site of left female breast: Secondary | ICD-10-CM | POA: Diagnosis not present

## 2018-01-07 DIAGNOSIS — C50911 Malignant neoplasm of unspecified site of right female breast: Secondary | ICD-10-CM | POA: Diagnosis not present

## 2018-01-07 DIAGNOSIS — C50912 Malignant neoplasm of unspecified site of left female breast: Secondary | ICD-10-CM | POA: Diagnosis not present

## 2018-02-23 DIAGNOSIS — C50912 Malignant neoplasm of unspecified site of left female breast: Secondary | ICD-10-CM | POA: Diagnosis not present

## 2018-02-23 DIAGNOSIS — C50911 Malignant neoplasm of unspecified site of right female breast: Secondary | ICD-10-CM | POA: Diagnosis not present

## 2018-03-10 ENCOUNTER — Encounter: Payer: Self-pay | Admitting: Genetics

## 2018-04-07 DIAGNOSIS — C50912 Malignant neoplasm of unspecified site of left female breast: Secondary | ICD-10-CM | POA: Diagnosis not present

## 2018-04-07 DIAGNOSIS — C50911 Malignant neoplasm of unspecified site of right female breast: Secondary | ICD-10-CM | POA: Diagnosis not present

## 2018-05-26 ENCOUNTER — Inpatient Hospital Stay: Payer: 59 | Attending: Oncology

## 2018-05-26 DIAGNOSIS — C50411 Malignant neoplasm of upper-outer quadrant of right female breast: Secondary | ICD-10-CM

## 2018-05-26 DIAGNOSIS — C50921 Malignant neoplasm of unspecified site of right male breast: Secondary | ICD-10-CM

## 2018-05-26 DIAGNOSIS — Z170 Estrogen receptor positive status [ER+]: Secondary | ICD-10-CM

## 2018-05-26 DIAGNOSIS — C50812 Malignant neoplasm of overlapping sites of left female breast: Secondary | ICD-10-CM | POA: Diagnosis not present

## 2018-05-26 LAB — CBC WITH DIFFERENTIAL/PLATELET
Abs Immature Granulocytes: 0.02 10*3/uL (ref 0.00–0.07)
Basophils Absolute: 0 10*3/uL (ref 0.0–0.1)
Basophils Relative: 0 %
EOS ABS: 0.2 10*3/uL (ref 0.0–0.5)
Eosinophils Relative: 4 %
HEMATOCRIT: 42.4 % (ref 36.0–46.0)
HEMOGLOBIN: 13.7 g/dL (ref 12.0–15.0)
IMMATURE GRANULOCYTES: 0 %
LYMPHS ABS: 1.3 10*3/uL (ref 0.7–4.0)
LYMPHS PCT: 25 %
MCH: 28.5 pg (ref 26.0–34.0)
MCHC: 32.3 g/dL (ref 30.0–36.0)
MCV: 88.3 fL (ref 80.0–100.0)
Monocytes Absolute: 0.3 10*3/uL (ref 0.1–1.0)
Monocytes Relative: 5 %
NEUTROS PCT: 66 %
NRBC: 0 % (ref 0.0–0.2)
Neutro Abs: 3.4 10*3/uL (ref 1.7–7.7)
Platelets: 195 10*3/uL (ref 150–400)
RBC: 4.8 MIL/uL (ref 3.87–5.11)
RDW: 13.2 % (ref 11.5–15.5)
WBC: 5.2 10*3/uL (ref 4.0–10.5)

## 2018-05-26 LAB — COMPREHENSIVE METABOLIC PANEL
ALT: 39 U/L (ref 0–44)
ANION GAP: 11 (ref 5–15)
AST: 25 U/L (ref 15–41)
Albumin: 3.7 g/dL (ref 3.5–5.0)
Alkaline Phosphatase: 107 U/L (ref 38–126)
BILIRUBIN TOTAL: 0.5 mg/dL (ref 0.3–1.2)
BUN: 15 mg/dL (ref 8–23)
CHLORIDE: 107 mmol/L (ref 98–111)
CO2: 27 mmol/L (ref 22–32)
Calcium: 9.6 mg/dL (ref 8.9–10.3)
Creatinine, Ser: 0.82 mg/dL (ref 0.44–1.00)
GFR calc Af Amer: >60 mL/min (ref >60)
GFR calc non Af Amer: >60 mL/min (ref >60)
GLUCOSE: 107 mg/dL — AB (ref 70–99)
POTASSIUM: 3.5 mmol/L (ref 3.5–5.1)
SODIUM: 145 mmol/L (ref 135–145)
TOTAL PROTEIN: 7.3 g/dL (ref 6.5–8.1)

## 2018-06-01 NOTE — Progress Notes (Signed)
Brandi Bates  Telephone:([phone] [phone]) [phone]    ID: ROCKY RISHEL   DOB: August 20, 1955  MR#: [id number]  VOH#:[id number]  PCP: Leighton Ruff, MD  CHIEF COMPLAINT:  bilateral breast cancers, both estrogen receptor positive  CURRENT TREATMENT: Completing 7 years of antiestrogen  BREAST CANCER HISTORY: From the original intake note:  The patient felt a mass in her left axilla several months ago.  She did not bring this to her physician's attention because she wasn't sure it really meant anything, but she asked her daughter, and her daughter asked a nurse fried, and they told her she better get on and have this evaluated, so she did bring it to [name] attention and he set her up for diagnostic mammography and ultrasonography performed at [facility] on September 22, 2010.  Dr. [name] was able to demonstrate a spiculated density at the 12 o'clock position of the breast with pleomorphic calcifications measuring about 4 cm.  There was a 2nd focal rounded area of increased density and with the axilla, there was an ovoid mass measuring up to 3.8 cm. Ultrasound showed the area of heterogeneous decreased echogenicity as well as the 2nd discrete focus.  The right breast showed only a cystic mass measuring 1 cm which was of no consequence.  Biopsy was performed of the left breast mass only on the same day.  The pathology report ([id number]) biopsied 2 masses in the left breast.  The second mass was "located more superiorly" and was also identified and biopsied.  The pathology report labels both masses "at 12 o'clock" so we will have to clarify this issue.  At any rate, both masses were Grade 3 invasive ductal carcinoma.  The first one was estrogen receptor positive at 62% but progesterone receptor negative with a proliferation marker of 95%.  The second one was ER positive at 98% and PR "positive" at 3%, also with a proliferation marker of 95%.  Both showed no HER2 amplification.  With  this information, the patient was referred for breast MRI. This was performed September 30, 2010 and showed the main breast mass to measure 3.8 cm.  The posterior mass measured 6 millimeters. There were some confluent axillary masses to a maximum measurement of 6.8 cm. The patient underwent definitive surgery 10/23/2011 with results and subsequent treatment as detailed below   INTERVAL HISTORY: Srishti returns today for follow-up and treatment of her estrogen receptor positive breast cancer. She is accompanied by her husband, [name].   She continues on anastrozole, with fair tolerance. She notes joint aches, cramping, hot flashes, and increased sweating occurrences.   Since her last visit to the office, she underwent a CT Chest w contrast on 06/10/2017 that showed: Tree-in-bud nodularity is demonstrated predominately within the peripheral right upper and right middle lobes, nonspecific however likely post infectious/inflammatory in etiology. Postradiation changes subpleural left upper lobe. No mediastinal or axillary adenopathy.  No evidence of metastatic disease    REVIEW OF SYSTEMS:  Allyah reports that for exercise, she hasn't done much due to recent diagnosis of plantar fascitis. She continues to work a primarily sedentary job that she walks throughout the day. She doesn't have a recumbent bike at home. She was in an article for [employer] because of her use of [employer] products. She has also been going to church. She denies unusual headaches, visual changes, nausea, vomiting, or dizziness. There has been no unusual cough, phlegm production, or pleurisy. This been no change in bowel or bladder habits. She  denies unexplained fatigue or unexplained weight loss, bleeding, rash, or fever. A detailed review of systems was otherwise stable.     Past Medical History:  Diagnosis Date  . Allergy   . Anxiety   . Bladder cystocele 11/20/11   low-lying ct result  . Breast cancer ([place]) 10/23/10   s/p L  mastectomy, chemo/radiation, er/pr +, Her2 -  . Breast cancer ([place]) 11/10/11   S/P right mastectomy  . Breast wound    Left breast from radiation, skin graft  . Chronic low back pain    "everyday"  . Eczema   . GERD (gastroesophageal reflux disease)   . Hepatic steatosis 11/20/11   severe   . History of cancer chemotherapy    "memory issues from chemo"  . History of radiation therapy 04/23/11 thru 06/08/11   L breast  . Lymphedema of arm    left  . Migraines   . Neuromuscular disorder ([place])    raynauds syndrome   . Numbness of feet   . Pneumonia April 2013  . PONV (postoperative nausea and vomiting)   . Pulmonary thromboembolism ([place]) 11/20/11   ct positive acute w/i segmental branches of right lower lobe  . Shortness of breath on exertion   Significant for history of migraines, history of GERD, history of chronic constipation, history of chronic low back pain, history of tobacco abuse, the patient quitting in 1998 with approximately a 50 pack-year history prior to that.  The patient is status post tubal ligation, status post simple hysterectomy without salpingo-oophorectomy and status post cholecystectomy.  PAST SURGICAL HISTORY: Past Surgical History:  Procedure Laterality Date  . ABDOMINAL HYSTERECTOMY    . BILATERAL SALPINGOOPHORECTOMY  11/10/11   laparoscopy  . BREAST SURGERY Bilateral    mastectomy  . CHOLECYSTECTOMY  1999  . DIAGNOSTIC LAPAROSCOPY    . IRRIGATION AND DEBRIDEMENT ABSCESS  06/16/2011   Procedure: IRRIGATION AND DEBRIDEMENT ABSCESS;  Surgeon: Harl Bowie, MD;  Location: [facility];  Service: General;  Laterality: Left;  incision and drainage of left chest wall abcess  . MASTECTOMY  11/10/11   right; w/SNB  . MASTECTOMY MODIFIED RADICAL  10/23/11   left  . MASTECTOMY W/ SENTINEL NODE BIOPSY  11/10/2011   Procedure: MASTECTOMY WITH SENTINEL LYMPH NODE BIOPSY;  Surgeon: Harl Bowie, MD;  Location: [facility];  Service: General;  Laterality: Right;  .  PORT-A-CATH REMOVAL Right 09/26/2014   Procedure: MINOR REMOVAL PORT-A-CATH;  Surgeon: Coralie Keens, MD;  Location: [place];  Service: General;  Laterality: Right;  . PORTACATH PLACEMENT     right subclavian  . TUBAL LIGATION  1979    FAMILY HISTORY Family History  Problem Relation Age of Onset  . Cancer Mother        lung  . Hypertension Mother   . Cancer Father        lung  . Cancer Brother        BRAIN CANCER  . Hypertension Brother   . Cancer Cousin         2 PATERNAL COUSINS - BREAST CA  . Hypertension Sister   . Hypertension Maternal Uncle   . Hypertension Maternal Grandmother   . Colon cancer Other 87       colon METS to liver  The patient's father died at the age of 18 and the patient's mother at the age of 43.  Both were smokers and both had lung cancer. The patient had one brother who died at  age 71 with glioblastoma multiforme.  She has one surviving brother, one surviving sister who is present today, and a half-brother.  There are two cousins, both on the father's side, who had breast cancer in their 48s.  There is one uncle with Leverne Humbles disease.  GYNECOLOGIC HISTORY: She is GX P1.  First pregnancy to term at age 64.  She never took hormone replacement.    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  Her husband, [name], is a Geologist, engineering for an [employer].  Daughter Leveda Anna works at the same office as her mother.  Son Louie Casa works for [employer].  The patient has 7 grandchildren. She attends a [employer].     ADVANCED DIRECTIVES: in place  HEALTH MAINTENANCE: Social History   Tobacco Use  . Smoking status: Former Smoker    Packs/day: 2.00    Years: 28.00    Pack years: 56.00    Types: Cigarettes    Last attempt to quit: 05/31/1997    Years since quitting: 21.0  . Smokeless tobacco: Never Used  Substance Use Topics  . Alcohol use:  No  . Drug use: No     Colonoscopy:  PAP: s/p hysterectomy  Bone density: SOLIS October 2012, T - 1.4 [facility]  Lipid panel:  Allergies  Allergen Reactions  . Morphine And Related Hives and Itching    All over the body  . Omeprazole Magnesium Nausea Only  . Gabapentin Other (See Comments)    Unable to sleep  . Prilosec [Omeprazole] Nausea Only  . Latex Rash    Only where touched    Current Outpatient Medications  Medication Sig Dispense Refill  . anastrozole (ARIMIDEX) 1 MG tablet Take 1 tablet (1 mg total) daily by mouth. 30 tablet 12  . aspirin 81 MG tablet Take 81 mg by mouth daily.    . B Complex Vitamins (B COMPLEX-B12) TABS Take 1 tablet by mouth daily.    . calcium carbonate (TUMS - DOSED IN MG ELEMENTAL CALCIUM) 500 MG chewable tablet Chew 1 tablet by mouth as needed. Reported on 08/06/2015    . calcium-vitamin D 250-100 MG-UNIT per tablet Take 1 tablet by mouth daily.    . famotidine (PEPCID) 20 MG tablet Take 20 mg by mouth daily.    Marland Kitchen LORazepam (ATIVAN) 0.5 MG tablet Take 1 tablet (0.5 mg total) 2 (two) times daily as needed by mouth for anxiety. 30 tablet 0  . Potassium 99 MG TABS Take daily by mouth.    . venlafaxine XR (EFFEXOR-XR) 75 MG 24 hr capsule Take 1 capsule (75 mg total) daily with breakfast by mouth. 30 capsule 6  . vitamin C (ASCORBIC ACID) 500 MG tablet Take 500 mg by mouth daily.     No current facility-administered medications for this visit.     OBJECTIVE:  Middle-aged white woman in no acute distress  Vitals:   06/02/18 0858  BP: (!) 142/83  Pulse: 86  Resp: 18  Temp: 98 F (36.7 C)  SpO2: 96%     Body mass index is 31 kg/m.    ECOG FS: 1 Filed Weights   06/02/18 0858  Weight: 180 lb 9.6 oz (81.9 kg)   Sclerae unicteric, pupils round and equal No cervical or supraclavicular adenopathy Lungs no rales or rhonchi Heart regular rate and rhythm Abd soft, nontender, positive bowel sounds MSK  no focal spinal tenderness, no upper extremity  lymphedema Neuro: nonfocal, well oriented, appropriate affect Breasts: Status post bilateral mastectomies.  The scars are somewhat irregular, but there is no evidence of chest wall recurrence.  Both axillae are benign.  LAB RESULTS: Lab Results  Component Value Date   WBC 5.2 05/26/2018   NEUTROABS 3.4 05/26/2018   HGB 13.7 05/26/2018   HCT 42.4 05/26/2018   MCV 88.3 05/26/2018   PLT 195 05/26/2018      Chemistry      Component Value Date/Time   NA 145 05/26/2018 0748   NA 143 06/03/2017 0841   K 3.5 05/26/2018 0748   K 3.6 06/03/2017 0841   CL 107 05/26/2018 0748   CL 106 10/05/2012 1234   CO2 27 05/26/2018 0748   CO2 24 06/03/2017 0841   BUN 15 05/26/2018 0748   BUN 17.0 06/03/2017 0841   CREATININE 0.82 05/26/2018 0748   CREATININE 0.7 06/03/2017 0841      Component Value Date/Time   CALCIUM 9.6 05/26/2018 0748   CALCIUM 9.8 06/03/2017 0841   ALKPHOS 107 05/26/2018 0748   ALKPHOS 84 06/03/2017 0841   AST 25 05/26/2018 0748   AST 19 06/03/2017 0841   ALT 39 05/26/2018 0748   ALT 32 06/03/2017 0841   BILITOT 0.5 05/26/2018 0748   BILITOT 0.49 06/03/2017 0841        STUDIES: Results of last years CT of the chest discussed with the patient  ASSESSMENT:61 y.o.  [place] woman with a BRCA mutation of unknown clinical significance  (1) status post left modified radical mastectomy March of 2012 for a T3 N3a (stage IIIC)  invasive ductal carcinoma, grade 3,  which was strongly estrogen receptor positive, progesterone receptor and HER2 negative, with an MIB-1 of 95%.   (2) s/p 4 cycles of adjuvant dose dense doxorubicin and cyclophosphamide, and 11 doses of weekly paclitaxel,   (3) s/p radiation to the left chest and regional nodes completed November 2012, at which time she started tamoxifen  (4) s/p right mastectomy with sentinel lymph node sampling 11/10/2011 for a pT1b pN0, stage IA invasive ductal carcinoma, grade 1, with insufficient tumor for estrogen and  progesterone testing, but HER-2 negative.  (5) anastrozole started May 2013, interrupted June 2013, resumed July 2013, stopped November 2019  (6) post-op pulmonary embolus documented 11/20/2011, completed over 6 months of Coumadin anticoagulation  PLAN:  Lacara is now nearly 8 years out from definitive surgery for her breast cancer with no evidence of disease recurrence.  This is very favorable.  She is completing 7 years of antiestrogens, with most of that time being aromatase inhibitors.  We have data comparing 7 versus 10 years of aromatase inhibitors and the additional 3 years added nothing in terms of risk reduction.  Accordingly I am comfortable stopping her anastrozole at this point.  Anticipate within 2 months she will feel a little bit better overall, but very likely she will continue to have problems with hot flashes and vaginal dryness since those are really postmenopausal issues primarily  At this point I feel comfortable releasing her to her primary care physicians care.  All she will need in terms of breast cancer follow-up is a yearly chest wall physician exam.  I will be glad to see Trystin at any point in the future if and when the need arises but as of now are making no further routine appointments for her here.  Magrinat, Virgie Dad, MD  06/02/18 9:25 AM Medical  Oncology and Hematology [facility] [address] [place], [place] [address] Tel. [phone]    Fax. [phone]    I, Soijett Blue am acting as scribe for Dr. Sarajane Jews C. Magrinat.  I, Lurline Del MD, have reviewed the above documentation for accuracy and completeness, and I agree with the above.

## 2018-06-02 ENCOUNTER — Encounter: Payer: Self-pay | Admitting: Oncology

## 2018-06-02 ENCOUNTER — Inpatient Hospital Stay: Payer: 59 | Attending: Oncology | Admitting: Oncology

## 2018-06-02 VITALS — BP 142/83 | HR 86 | Temp 98.0°F | Resp 18 | Ht 64.0 in | Wt 180.6 lb

## 2018-06-02 DIAGNOSIS — Z87891 Personal history of nicotine dependence: Secondary | ICD-10-CM | POA: Diagnosis not present

## 2018-06-02 DIAGNOSIS — Z9012 Acquired absence of left breast and nipple: Secondary | ICD-10-CM | POA: Insufficient documentation

## 2018-06-02 DIAGNOSIS — C50912 Malignant neoplasm of unspecified site of left female breast: Secondary | ICD-10-CM | POA: Diagnosis not present

## 2018-06-02 DIAGNOSIS — Z923 Personal history of irradiation: Secondary | ICD-10-CM | POA: Insufficient documentation

## 2018-06-02 DIAGNOSIS — C50411 Malignant neoplasm of upper-outer quadrant of right female breast: Secondary | ICD-10-CM

## 2018-06-02 DIAGNOSIS — Z79899 Other long term (current) drug therapy: Secondary | ICD-10-CM | POA: Diagnosis not present

## 2018-06-02 DIAGNOSIS — C50812 Malignant neoplasm of overlapping sites of left female breast: Secondary | ICD-10-CM | POA: Diagnosis present

## 2018-06-02 DIAGNOSIS — Z7982 Long term (current) use of aspirin: Secondary | ICD-10-CM

## 2018-06-02 DIAGNOSIS — Z170 Estrogen receptor positive status [ER+]: Secondary | ICD-10-CM | POA: Diagnosis not present

## 2019-04-26 ENCOUNTER — Encounter (HOSPITAL_COMMUNITY): Payer: Self-pay

## 2019-04-26 ENCOUNTER — Emergency Department (HOSPITAL_COMMUNITY): Payer: 59

## 2019-04-26 ENCOUNTER — Other Ambulatory Visit: Payer: Self-pay

## 2019-04-26 ENCOUNTER — Emergency Department (HOSPITAL_COMMUNITY)
Admission: EM | Admit: 2019-04-26 | Discharge: 2019-04-26 | Disposition: A | Discharge status: Home / Self Care | Payer: 59 | Attending: Emergency Medicine | Admitting: Emergency Medicine

## 2019-04-26 DIAGNOSIS — R079 Chest pain, unspecified: Secondary | ICD-10-CM | POA: Insufficient documentation

## 2019-04-26 DIAGNOSIS — Z853 Personal history of malignant neoplasm of breast: Secondary | ICD-10-CM | POA: Insufficient documentation

## 2019-04-26 DIAGNOSIS — Z79899 Other long term (current) drug therapy: Secondary | ICD-10-CM | POA: Diagnosis not present

## 2019-04-26 DIAGNOSIS — Z923 Personal history of irradiation: Secondary | ICD-10-CM | POA: Insufficient documentation

## 2019-04-26 DIAGNOSIS — R0602 Shortness of breath: Secondary | ICD-10-CM | POA: Diagnosis not present

## 2019-04-26 DIAGNOSIS — Z91040 Latex allergy status: Secondary | ICD-10-CM | POA: Diagnosis not present

## 2019-04-26 DIAGNOSIS — Z9013 Acquired absence of bilateral breasts and nipples: Secondary | ICD-10-CM | POA: Diagnosis not present

## 2019-04-26 DIAGNOSIS — Z7982 Long term (current) use of aspirin: Secondary | ICD-10-CM | POA: Diagnosis not present

## 2019-04-26 DIAGNOSIS — Z9221 Personal history of antineoplastic chemotherapy: Secondary | ICD-10-CM | POA: Insufficient documentation

## 2019-04-26 DIAGNOSIS — Z87891 Personal history of nicotine dependence: Secondary | ICD-10-CM | POA: Insufficient documentation

## 2019-04-26 LAB — CBC
HCT: 45.6 % (ref 36.0–46.0)
Hemoglobin: 14.6 g/dL (ref 12.0–15.0)
MCH: 28.6 pg (ref 26.0–34.0)
MCHC: 32 g/dL (ref 30.0–36.0)
MCV: 89.2 fL (ref 80.0–100.0)
Platelets: 222 10*3/uL (ref 150–400)
RBC: 5.11 MIL/uL (ref 3.87–5.11)
RDW: 13.2 % (ref 11.5–15.5)
WBC: 6.8 10*3/uL (ref 4.0–10.5)
nRBC: 0 % (ref 0.0–0.2)

## 2019-04-26 LAB — D-DIMER, QUANTITATIVE (NOT AT ARMC): D-Dimer, Quant: <0.27 ug/mL-FEU (ref 0.00–0.50)

## 2019-04-26 LAB — BASIC METABOLIC PANEL
Anion gap: 9 (ref 5–15)
BUN: 20 mg/dL (ref 8–23)
CO2: 25 mmol/L (ref 22–32)
Calcium: 9.8 mg/dL (ref 8.9–10.3)
Chloride: 106 mmol/L (ref 98–111)
Creatinine, Ser: 0.63 mg/dL (ref 0.44–1.00)
GFR calc Af Amer: >60 mL/min (ref >60)
GFR calc non Af Amer: >60 mL/min (ref >60)
Glucose, Bld: 107 mg/dL — ABNORMAL HIGH (ref 70–99)
Potassium: 3.9 mmol/L (ref 3.5–5.1)
Sodium: 140 mmol/L (ref 135–145)

## 2019-04-26 LAB — TROPONIN I (HIGH SENSITIVITY)
Troponin I (High Sensitivity): 5 ng/L (ref <18)
Troponin I (High Sensitivity): 5 ng/L (ref <18)

## 2019-04-26 MED ORDER — IOHEXOL 350 MG/ML SOLN
100.0000 mL | Freq: Once | INTRAVENOUS | Status: AC | PRN
  Administered 2019-04-26: 100 mL via INTRAVENOUS

## 2019-04-26 MED ORDER — SODIUM CHLORIDE 0.9% FLUSH
3.0000 mL | Freq: Once | INTRAVENOUS | Status: AC
  Administered 2019-04-26: 3 mL via INTRAVENOUS

## 2019-04-26 NOTE — ED Notes (Signed)
Patient states that her pain is a 5/10 but she does not want any pain medicine at this time. Patient instructed to alert RN if anything changes. Will continue to monitor patient.

## 2019-04-26 NOTE — Discharge Instructions (Addendum)
Read instructions below for reasons to return to the Emergency Department. It is recommended that your follow up with your Primary Care Doctor in regards to today's visit within 1 week.  Tests performed today include: An EKG of your heart A chest x-ray Cardiac enzymes - a blood test for heart muscle damage Blood counts and electrolytes  SEEK MEDICAL CARE IF:  You think you are having problems from the medicine you are taking. Read your medicine instructions carefully.  Your chest pain does not go away, even after treatment.  You develop a rash with blisters on your chest.   SEEK IMMEDIATE MEDICAL CARE IF:  You have increased chest pain or pain that spreads to your arm, neck, jaw, back, or belly (abdomen).  You develop shortness of breath, an increasing cough, or you are coughing up blood.  You have severe back or abdominal pain, feel sick to your stomach (nauseous) or throw up (vomit).  You develop severe weakness, fainting, or chills.  You have an oral temperature above 102 F (38.9 C), not controlled by medicine.   THIS IS AN EMERGENCY. Do not wait to see if the pain will go away. Get medical help at once. Call 911. Do not drive yourself to the hospital.

## 2019-04-26 NOTE — ED Provider Notes (Signed)
[facility] DEPT Provider Note   CSN: [id number] Arrival date & time: 04/26/19  1607     History   Chief Complaint Chief Complaint  Patient presents with   Chest Pain    HPI Brandi Bates is a 63 y.o. female with past medical history significant for breast cancer status post bilateral mastectomy, PE, chronic low back pain, GERD, hepatics steatosis presents emergency department today with chief complaint of chest pain x4 days.  Pain is located left side of her chest.  It has been intermittent.  She describes pain as sharp when present only lasts a few seconds.  She has not taken anything for her pain prior to arrival.  Denies heart disease in immediate family but does state it is prominent and on her father's side although he has none himself.  Patient does not smoke, is not diabetic.  She reports her PE was back in 2013, she is not currently anticoagulated.  She does also report intermittent shortness of breath with pain.  Denies fever, chills, dyspnea on exertion, SOB, chest tightness or pressure, cough, radiation to left/right arm, jaw or back, nausea, or diaphoresis, lower extremity edema. History provided by patient with additional history obtained from chart review.     Past Medical History:  Diagnosis Date   Allergy    Anxiety    Bladder cystocele 11/20/11   low-lying ct result   Breast cancer ([place]) 10/23/10   s/p L mastectomy, chemo/radiation, er/pr +, Her2 -   Breast cancer ([place]) 11/10/11   S/P right mastectomy   Breast wound    Left breast from radiation, skin graft   Chronic low back pain    "everyday"   Eczema    GERD (gastroesophageal reflux disease)    Hepatic steatosis 11/20/11   severe    History of cancer chemotherapy    "memory issues from chemo"   History of radiation therapy 04/23/11 thru 06/08/11   L breast   Lymphedema of arm    left   Migraines    Neuromuscular disorder ([place])    raynauds syndrome     Numbness of feet    Pneumonia April 2013   PONV (postoperative nausea and vomiting)    Pulmonary thromboembolism ([place]) 11/20/11   ct positive acute w/i segmental branches of right lower lobe   Shortness of breath on exertion     Patient Active Problem List   Diagnosis Date Noted   Hypokalemia 03/20/2013   Anxiety 03/20/2013   Malignant neoplasm of upper-outer quadrant of right breast in female, estrogen receptor positive ([place]) 12/02/2011   Pulmonary thromboembolism ([place])    Pulmonary embolism ([place]) 11/25/2011   History of breast cancer 11/21/2011   Hepatic steatosis 11/21/2011   Constipation 11/21/2011   Overweight (BMI 25.0-29.9) 11/21/2011   Acute pulmonary embolism ([place]) 11/20/2011   Community acquired pneumonia 11/20/2011   Leukocytosis 11/20/2011   Normocytic anemia 11/20/2011   History of radiation therapy     Past Surgical History:  Procedure Laterality Date   ABDOMINAL HYSTERECTOMY     BILATERAL SALPINGOOPHORECTOMY  11/10/11   laparoscopy   BREAST SURGERY Bilateral    mastectomy   CHOLECYSTECTOMY  1999   DIAGNOSTIC LAPAROSCOPY     IRRIGATION AND DEBRIDEMENT ABSCESS  06/16/2011   Procedure: IRRIGATION AND DEBRIDEMENT ABSCESS;  Surgeon: Harl Bowie, MD;  Location: [facility];  Service: General;  Laterality: Left;  incision and drainage of left chest wall abcess   MASTECTOMY  11/10/11   right;  w/SNB   MASTECTOMY MODIFIED RADICAL  10/23/11   left   MASTECTOMY W/ SENTINEL NODE BIOPSY  11/10/2011   Procedure: MASTECTOMY WITH SENTINEL LYMPH NODE BIOPSY;  Surgeon: Harl Bowie, MD;  Location: [facility];  Service: General;  Laterality: Right;   PORT-A-CATH REMOVAL Right 09/26/2014   Procedure: MINOR REMOVAL PORT-A-CATH;  Surgeon: Coralie Keens, MD;  Location: [facility];  Service: General;  Laterality: Right;   PORTACATH PLACEMENT     right subclavian   TUBAL LIGATION  1979     OB History    Gravida  2   Para  2    Term      Preterm      AB      Living        SAB      TAB      Ectopic      Multiple      Live Births               Home Medications    Prior to Admission medications   Medication Sig Start Date End Date Taking? Authorizing Provider  aspirin 81 MG tablet Take 81 mg by mouth daily.   Yes [provider]  calcium carbonate (TUMS - DOSED IN MG ELEMENTAL CALCIUM) 500 MG chewable tablet Chew 1 tablet by mouth as needed. Reported on 08/06/2015   Yes [provider]  cholecalciferol (VITAMIN D3) 25 MCG (1000 UT) tablet Take 1 tablet (1,000 Units total) by mouth daily. 06/02/18  Yes Magrinat, Virgie Dad, MD  famotidine (PEPCID) 20 MG tablet Take 20 mg by mouth daily.   Yes [provider]  LORazepam (ATIVAN) 0.5 MG tablet Take 1 tablet (0.5 mg total) 2 (two) times daily as needed by mouth for anxiety. 06/03/17  Yes Magrinat, Virgie Dad, MD  Potassium 99 MG TABS Take daily by mouth.   Yes [provider]  vitamin C (ASCORBIC ACID) 500 MG tablet Take 500 mg by mouth daily.   Yes [provider]  gabapentin (NEURONTIN) 300 MG capsule daily. 09/17/11 10/02/11  [provider]    Family History Family History  Problem Relation Age of Onset   Cancer Mother        lung   Hypertension Mother    Cancer Father        lung   Cancer Brother        BRAIN CANCER   Hypertension Brother    Cancer Cousin         2 PATERNAL COUSINS - BREAST CA   Hypertension Sister    Hypertension Maternal Uncle    Hypertension Maternal Grandmother    Colon cancer Other 27       colon METS to liver    Social History Social History   Tobacco Use   Smoking status: Former Smoker    Packs/day: 2.00    Years: 28.00    Pack years: 56.00    Types: Cigarettes    Quit date: 05/31/1997    Years since quitting: 21.9   Smokeless tobacco: Never Used  Substance Use Topics   Alcohol use: No   Drug use: No     Allergies   Morphine and  related, Omeprazole magnesium, Gabapentin, Prilosec [omeprazole], and Latex   Review of Systems Review of Systems  Constitutional: Negative for chills and fever.  HENT: Negative for congestion, ear discharge, ear pain, sinus pressure, sinus pain and sore throat.   Eyes: Negative for pain  and redness.  Respiratory: Positive for shortness of breath. Negative for cough and wheezing.   Cardiovascular: Positive for chest pain. Negative for palpitations and leg swelling.  Gastrointestinal: Negative for abdominal pain, constipation, diarrhea, nausea and vomiting.  Genitourinary: Negative for dysuria and hematuria.  Musculoskeletal: Negative for back pain and neck pain.  Skin: Negative for wound.  Neurological: Negative for weakness, numbness and headaches.     Physical Exam Updated Vital Signs BP (!) 150/80 (BP Location: Right Arm)    Pulse 67    Temp 97.8 F (36.6 C) (Oral)    Resp 14    Ht _0  (1.626 m)    Wt 81.2 kg    SpO2 96%    BMI 30.73 kg/m   Physical Exam Vitals signs and nursing note reviewed.  Constitutional:      General: She is not in acute distress.    Appearance: She is not ill-appearing.     Comments: Patient has history of bilateral mastectomy.  She is resting comfortably on stretcher.  HENT:     Head: Normocephalic and atraumatic.     Right Ear: Tympanic membrane and external ear normal.     Left Ear: Tympanic membrane and external ear normal.     Nose: Nose normal.     Mouth/Throat:     Mouth: Mucous membranes are moist.     Pharynx: Oropharynx is clear.  Eyes:     General: No scleral icterus.       Right eye: No discharge.        Left eye: No discharge.     Extraocular Movements: Extraocular movements intact.     Conjunctiva/sclera: Conjunctivae normal.     Pupils: Pupils are equal, round, and reactive to light.  Neck:     Musculoskeletal: Normal range of motion.     Vascular: No JVD.  Cardiovascular:     Rate and Rhythm: Normal rate and regular rhythm.      Pulses: Normal pulses.          Radial pulses are 2+ on the right side and 2+ on the left side.     Heart sounds: Normal heart sounds.  Pulmonary:     Comments: Lungs clear to auscultation in all fields. Symmetric chest rise. No wheezing, rales, or rhonchi.  No tachypnea noted.  SPO2 on room air is 97%.  She is speaking in full sentences, no accessory muscle use. Abdominal:     Comments: Abdomen is soft, non-distended, and non-tender in all quadrants. No rigidity, no guarding. No peritoneal signs.  Musculoskeletal: Normal range of motion.     Right lower leg: She exhibits no tenderness.     Left lower leg: She exhibits no tenderness.     Comments: Homans sign absent bilaterally, no lower extremity edema, no palpable cords, compartments are soft  Skin:    General: Skin is warm and dry.     Capillary Refill: Capillary refill takes less than 2 seconds.  Neurological:     Mental Status: She is oriented to person, place, and time.     GCS: GCS eye subscore is 4. GCS verbal subscore is 5. GCS motor subscore is 6.     Comments: Fluent speech, no facial droop.  Psychiatric:        Behavior: Behavior normal.      ED Treatments / Results  Labs (all labs ordered are listed, but only abnormal results are displayed) Labs Reviewed  BASIC METABOLIC PANEL - Abnormal; Notable for the  following components:      Result Value   Glucose, Bld 107 (*)    All other components within normal limits  CBC  D-DIMER, QUANTITATIVE (NOT AT [facility])  TROPONIN I (HIGH SENSITIVITY)  TROPONIN I (HIGH SENSITIVITY)    EKG EKG Interpretation  Date/Time:  Wednesday April 26 2019 16:19:13 EDT Ventricular Rate:  69 PR Interval:    QRS Duration: 106 QT Interval:  424 QTC Calculation: 455 R Axis:   87 Text Interpretation:  Sinus rhythm Borderline right axis deviation Low voltage, precordial leads Borderline T abnormalities, anterior leads Confirmed by Gerlene Fee [id number]) on 04/26/2019 7:34:20  PM   Radiology Dg Chest 2 View  Result Date: 04/26/2019 CLINICAL DATA:  Left-sided chest pain for several days with shortness of breath EXAM: CHEST - 2 VIEW COMPARISON:  06/10/2017 FINDINGS: Cardiac shadow is stable. Postsurgical changes are noted in the left axilla. The lungs are well aerated bilaterally. Extrinsic artifact is noted over the right apex. No focal infiltrate or sizable effusion is noted. No bony abnormality is seen. IMPRESSION: No acute abnormality noted. Electronically Signed   By: Inez Catalina M.D.   On: 04/26/2019 16:44   Ct Angio Chest Pe W Or Wo Contrast  Result Date: 04/26/2019 CLINICAL DATA:  PE suspected.  High probability. EXAM: CT ANGIOGRAPHY CHEST WITH CONTRAST TECHNIQUE: Multidetector CT imaging of the chest was performed using the standard protocol during bolus administration of intravenous contrast. Multiplanar CT image reconstructions and MIPs were obtained to evaluate the vascular anatomy. CONTRAST:  160m OMNIPAQUE IOHEXOL 350 MG/ML SOLN COMPARISON:  06/10/2017 FINDINGS: Cardiovascular: The main pulmonary artery is patent. No central obstructing pulmonary emboli identified. The lobar pulmonary arteries are patent bilaterally. No segmental or subsegmental pulmonary artery filling defects identified. Mild aortic atherosclerosis. Calcification noted in the RCA coronary artery. No pericardial effusion. Mediastinum/Nodes: No enlarged mediastinal, hilar, or axillary lymph nodes. Thyroid gland, trachea, and esophagus demonstrate no significant findings. Lungs/Pleura: No pleural effusion. Stable right posterior lung base scar. Architectural distortion and fibrosis within the subpleural, anterolateral left upper lobe compatible with changes secondary to external beam radiation. 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 Upper Abdomen: Hepatic steatosis is identified. No acute abnormality. Cholecystectomy. Musculoskeletal: Status post bilateral mastectomy with left axillary nodal dissection. No acute or significant osseous abnormalities. Review of the MIP images confirms the above findings. IMPRESSION: 1. No acute cardiopulmonary abnormalities. No evidence for acute pulmonary embolus. 2. Post radiation changes noted within the anterolateral left upper lobe 3. Chronic changes including bronchiectasis and tree-in-bud nodularity within the right lung are identified. These are favored to represent mild changes secondary to chronic, indolent atypical infection such as MAI. 4.  Aortic Atherosclerosis (ICD10-I70.0). Electronically Signed   By: TKerby MoorsM.D.   On: 04/26/2019 21:04    Procedures Procedures (including critical care time)  Medications Ordered in ED Medications  sodium chloride flush (NS) 0.9 % injection 3 mL (3 mLs Intravenous Given 04/26/19 1829)  iohexol (OMNIPAQUE) 350 MG/ML injection 100 mL (100 mLs Intravenous Contrast Given 04/26/19 2038)     Initial Impression / Assessment and Plan / ED Course  I have reviewed the triage vital signs and the nursing notes.  Pertinent labs & imaging results that were available during my care of the patient were reviewed by me and considered in my medical decision making (see  chart for details).  Patient with past medical history of breast cancer presents to the emergency department with chest pain. Patient nontoxic appearing, in no apparent distress, vitals without significant abnormality. Fairly benign physical exam. DDX: ACS, pulmonary embolism, dissection, pneumothorax, effusion, infiltrate, arrhythmia, anemia, electrolyte derangement, MSK. Evaluation initiated with labs, EKG, and CXR. Patient on cardiac monitor. Pt declines pain and nausea medication.  Work-up in the ER unremarkable. Labs reviewed, no leukocytosis, anemia, or  significant electrolyte abnormality. CXR without infiltrate, effusion, pneumothorax, or fracture/dislocation.  CTA is negative for PE.  Low risk heart score of 1, EKG without obvious ischemia, delta troponin negative, doubt ACS. Pain is not a tearing sensation, symmetric pulses, no widening of mediastinum on CXR, doubt dissection. I ambulated patient in the exam room and she did so without dyspnea or hypoxia.  SPO2 stayed above 96% on room air.  Patient has appeared hemodynamically stable throughout ER visit and appears safe for discharge with close PCP/cardiology follow up. I discussed results, treatment plan, need for PCP follow-up, and return precautions with the patient. Provided opportunity for questions, patient confirmed understanding and is in agreement with plan.   Case has been discussed with and seen by Dr. Sedonia Small who agrees with the above plan to discharge.    Final diagnoses:  Nonspecific chest pain    ED Discharge Orders    None       Flint Melter 04/26/19 2147    Maudie Flakes, MD 04/30/19 1244

## 2019-04-26 NOTE — ED Triage Notes (Signed)
Patient c/o intermittent left chest pain x 4 days and seems like it is getting more persistent. Patient states slight SOB as well.

## 2019-04-26 NOTE — ED Notes (Signed)
Patient transported to CT 

## 2020-03-08 ENCOUNTER — Encounter: Payer: Self-pay | Admitting: Genetic Counselor

## 2021-07-28 DIAGNOSIS — D492 Neoplasm of unspecified behavior of bone, soft tissue, and skin: Secondary | ICD-10-CM | POA: Diagnosis not present

## 2021-08-30 DIAGNOSIS — J0100 Acute maxillary sinusitis, unspecified: Secondary | ICD-10-CM | POA: Diagnosis not present

## 2021-09-03 DIAGNOSIS — J0100 Acute maxillary sinusitis, unspecified: Secondary | ICD-10-CM | POA: Diagnosis not present

## 2021-09-03 DIAGNOSIS — R0981 Nasal congestion: Secondary | ICD-10-CM | POA: Diagnosis not present

## 2021-09-03 DIAGNOSIS — Z20828 Contact with and (suspected) exposure to other viral communicable diseases: Secondary | ICD-10-CM | POA: Diagnosis not present

## 2021-09-03 DIAGNOSIS — R509 Fever, unspecified: Secondary | ICD-10-CM | POA: Diagnosis not present

## 2021-09-03 DIAGNOSIS — R519 Headache, unspecified: Secondary | ICD-10-CM | POA: Diagnosis not present

## 2021-10-29 DIAGNOSIS — Z961 Presence of intraocular lens: Secondary | ICD-10-CM | POA: Diagnosis not present

## 2021-10-29 DIAGNOSIS — H26492 Other secondary cataract, left eye: Secondary | ICD-10-CM | POA: Diagnosis not present

## 2021-10-29 DIAGNOSIS — H40013 Open angle with borderline findings, low risk, bilateral: Secondary | ICD-10-CM | POA: Diagnosis not present

## 2021-12-26 DIAGNOSIS — C50912 Malignant neoplasm of unspecified site of left female breast: Secondary | ICD-10-CM | POA: Diagnosis not present

## 2021-12-26 DIAGNOSIS — C50911 Malignant neoplasm of unspecified site of right female breast: Secondary | ICD-10-CM | POA: Diagnosis not present

## 2022-02-12 ENCOUNTER — Other Ambulatory Visit: Payer: Self-pay | Admitting: Family Medicine

## 2022-02-12 DIAGNOSIS — M79604 Pain in right leg: Secondary | ICD-10-CM | POA: Diagnosis not present

## 2022-02-12 DIAGNOSIS — R229 Localized swelling, mass and lump, unspecified: Secondary | ICD-10-CM | POA: Diagnosis not present

## 2022-02-13 ENCOUNTER — Ambulatory Visit
Admission: RE | Admit: 2022-02-13 | Discharge: 2022-02-13 | Disposition: A | Discharge status: Home / Self Care | Payer: PPO | Source: Ambulatory Visit | Attending: Family Medicine | Admitting: Family Medicine

## 2022-02-13 DIAGNOSIS — R2241 Localized swelling, mass and lump, right lower limb: Secondary | ICD-10-CM | POA: Diagnosis not present

## 2022-02-13 DIAGNOSIS — M79604 Pain in right leg: Secondary | ICD-10-CM

## 2022-02-13 DIAGNOSIS — R229 Localized swelling, mass and lump, unspecified: Secondary | ICD-10-CM

## 2022-02-26 DIAGNOSIS — H43811 Vitreous degeneration, right eye: Secondary | ICD-10-CM | POA: Diagnosis not present

## 2022-03-05 DIAGNOSIS — C50911 Malignant neoplasm of unspecified site of right female breast: Secondary | ICD-10-CM | POA: Diagnosis not present

## 2022-03-05 DIAGNOSIS — C50912 Malignant neoplasm of unspecified site of left female breast: Secondary | ICD-10-CM | POA: Diagnosis not present

## 2022-03-18 DIAGNOSIS — H43811 Vitreous degeneration, right eye: Secondary | ICD-10-CM | POA: Diagnosis not present

## 2022-05-27 DIAGNOSIS — K219 Gastro-esophageal reflux disease without esophagitis: Secondary | ICD-10-CM | POA: Diagnosis not present

## 2022-05-27 DIAGNOSIS — E782 Mixed hyperlipidemia: Secondary | ICD-10-CM | POA: Diagnosis not present

## 2022-05-27 DIAGNOSIS — I10 Essential (primary) hypertension: Secondary | ICD-10-CM | POA: Diagnosis not present

## 2022-05-27 DIAGNOSIS — R7303 Prediabetes: Secondary | ICD-10-CM | POA: Diagnosis not present

## 2022-05-27 DIAGNOSIS — Z0000 Encounter for general adult medical examination without abnormal findings: Secondary | ICD-10-CM | POA: Diagnosis not present

## 2022-07-21 DIAGNOSIS — Z0000 Encounter for general adult medical examination without abnormal findings: Secondary | ICD-10-CM | POA: Diagnosis not present

## 2022-07-21 DIAGNOSIS — I10 Essential (primary) hypertension: Secondary | ICD-10-CM | POA: Diagnosis not present

## 2022-07-21 DIAGNOSIS — R7303 Prediabetes: Secondary | ICD-10-CM | POA: Diagnosis not present

## 2022-07-21 DIAGNOSIS — E782 Mixed hyperlipidemia: Secondary | ICD-10-CM | POA: Diagnosis not present

## 2023-11-05 ENCOUNTER — Other Ambulatory Visit (HOSPITAL_COMMUNITY): Payer: Self-pay | Admitting: Family Medicine

## 2023-11-05 DIAGNOSIS — E782 Mixed hyperlipidemia: Secondary | ICD-10-CM

## 2023-11-16 ENCOUNTER — Ambulatory Visit (HOSPITAL_COMMUNITY)
Admission: RE | Admit: 2023-11-16 | Discharge: 2023-11-16 | Disposition: A | Discharge status: Home / Self Care | Payer: Self-pay | Source: Ambulatory Visit | Attending: Family Medicine | Admitting: Family Medicine

## 2023-11-16 DIAGNOSIS — E782 Mixed hyperlipidemia: Secondary | ICD-10-CM | POA: Insufficient documentation

## 2024-01-21 ENCOUNTER — Other Ambulatory Visit: Payer: Self-pay | Admitting: Family Medicine

## 2024-01-21 DIAGNOSIS — R9389 Abnormal findings on diagnostic imaging of other specified body structures: Secondary | ICD-10-CM

## 2024-02-08 ENCOUNTER — Ambulatory Visit
Admission: RE | Admit: 2024-02-08 | Discharge: 2024-02-08 | Disposition: A | Discharge status: Home / Self Care | Source: Ambulatory Visit | Attending: Family Medicine | Admitting: Family Medicine

## 2024-02-08 DIAGNOSIS — R9389 Abnormal findings on diagnostic imaging of other specified body structures: Secondary | ICD-10-CM

## 2024-03-20 ENCOUNTER — Telehealth: Payer: Self-pay

## 2024-03-20 ENCOUNTER — Ambulatory Visit

## 2024-03-20 VITALS — BP 148/81 | HR 69 | Ht 64.0 in | Wt 176.0 lb

## 2024-03-20 DIAGNOSIS — J984 Other disorders of lung: Secondary | ICD-10-CM | POA: Diagnosis not present

## 2024-03-20 DIAGNOSIS — R918 Other nonspecific abnormal finding of lung field: Secondary | ICD-10-CM

## 2024-03-20 DIAGNOSIS — C50911 Malignant neoplasm of unspecified site of right female breast: Secondary | ICD-10-CM | POA: Diagnosis not present

## 2024-03-20 DIAGNOSIS — C50912 Malignant neoplasm of unspecified site of left female breast: Secondary | ICD-10-CM | POA: Diagnosis not present

## 2024-03-20 NOTE — Telephone Encounter (Signed)
  Please schedule the following:   Provider performing procedure:Nazli Penn  Diagnosis: Cavitary lung lesion Which side if for nodule / mass? Left  Procedure: Flexible bronchoscopy   Has patient been spoken to by Provider and given informed consent? Yes  Anesthesia: General  Do you need Fluro? Yes  Duration of procedure: 30 Date: 03/29/24 Alternate Date: 03/28/24 Time:  PM 2:00  Location: Endoscopy- [facility] Does patient have OSA? No DM? NO Or Latex allergy? Yes  Medication Restriction/ Anticoagulate/Antiplatelet: None Pre-op Labs Ordered:determined by Anesthesia Imaging request: None

## 2024-03-20 NOTE — Progress Notes (Signed)
 Subjective:   PATIENT ID: Brandi Bates GENDER: female DOB: [birth year], MRN: [id number]   HPI Brandi Bates is a 68 year old female with a past medical history of breast cancer diagnosed and treated in 2012-2013 status post bilateral mastectomy.  She is a former smoker and quit smoking in 98.  She states that she smoked about 2 packs/day for about 30 years.  She also had a PE at that time which with treated with anticoagulation for about 6 months.  History of GERD, hepatic steatosis.  She is presenting to the pulmonary clinic for further evaluation of cavitary lung lesion in her lingula which was first noted on a CT scan done for calcium scoring in April 2025.  A repeat CT scan was ordered and July which showed that the cavitating lung lesion slightly enlarged.   On review of her prior imaging she had a CT in 2020 which showed that at the site of her left upper lobe lung cavitary lesion there was postradiation changes as well as small areas of tree-in-bud and bronchiectasis with nodularities in the right lung suspected to be related to an atypical infection at that time.  Patient only reports that she has mild dyspnea on exertion but otherwise denies any fevers, chills, night sweats, loss of appetite, weight loss, cough or mucus production.  She reports a significant family history of malignancies.  Her brother died of a glioblastoma.  Both her parents died from lung cancer.  She reports that she has multiple cousins that have breast cancer.  That in addition to her own history of bilateral breast cancer.     Past Medical History:  Diagnosis Date   Allergy    Anxiety    Bladder cystocele 11/20/11   low-lying ct result   Breast cancer ([facility]) 10/23/10   s/p L mastectomy, chemo/radiation, er/pr +, Her2 -   Breast cancer ([facility]) 11/10/11   S/P right mastectomy   Breast wound    Left breast from radiation, skin graft   Chronic low back pain    everyday   Eczema    GERD (gastroesophageal  reflux disease)    Hepatic steatosis 11/20/11   severe    History of cancer chemotherapy    memory issues from chemo   History of radiation therapy 04/23/11 thru 06/08/11   L breast   Lymphedema of arm    left   Migraines    Neuromuscular disorder ([facility])    raynauds syndrome    Numbness of feet    Pneumonia April 2013   PONV (postoperative nausea and vomiting)    Pulmonary thromboembolism ([facility]) 11/20/11   ct positive acute w/i segmental branches of right lower lobe   Shortness of breath on exertion      Family History  Problem Relation Age of Onset   Cancer Mother        lung   Hypertension Mother    Cancer Father        lung   Cancer Brother        BRAIN CANCER   Hypertension Brother    Cancer Cousin         2 PATERNAL COUSINS - BREAST CA   Hypertension Sister    Hypertension Maternal Uncle    Hypertension Maternal Grandmother    Colon cancer Other 43       colon METS to liver     Social History   Socioeconomic History   Marital status: Married    Spouse name: Not  on file   Number of children: Not on file   Years of education: Not on file   Highest education level: Not on file  Occupational History   Not on file  Tobacco Use   Smoking status: Former    Current packs/day: 0.00    Average packs/day: 2.0 packs/day for 28.0 years (56.0 ttl pk-yrs)    Types: Cigarettes    Start date: 05/31/1969    Quit date: 05/31/1997    Years since quitting: 26.8   Smokeless tobacco: Never  Vaping Use   Vaping status: Never Used  Substance and Sexual Activity   Alcohol use: No   Drug use: No   Sexual activity: Never    Comment: P1, 1st pregnancy age 85, no HRT  Other Topics Concern   Not on file  Social History Narrative   Not on file   Social Drivers of Health   Financial Resource Strain: Not on file  Food Insecurity: Not on file  Transportation Needs: Not on file  Physical Activity: Not on file  Stress: Not on file  Social Connections: Unknown (12/06/2021)    Received from [facility]   Social Network    Social Network: Not on file  Intimate Partner Violence: Unknown (10/29/2021)   Received from [facility]   HITS    Physically Hurt: Not on file    Insult or Talk Down To: Not on file    Threaten Physical Harm: Not on file    Scream or Curse: Not on file     Allergies  Allergen Reactions   Morphine  And Codeine Hives and Itching    All over the body   Omeprazole Magnesium  Nausea Only   Gabapentin Other (See Comments)    Unable to sleep   Prilosec [Omeprazole] Nausea Only   Latex Rash    Only where touched     Outpatient Medications Prior to Visit  Medication Sig Dispense Refill   alendronate (FOSAMAX) 70 MG tablet SMARTSIG:1 Tablet(s) By Mouth     aspirin 81 MG tablet Take 81 mg by mouth daily.     atorvastatin (LIPITOR) 10 MG tablet Take 10 mg by mouth daily.     calcium carbonate (TUMS - DOSED IN MG ELEMENTAL CALCIUM) 500 MG chewable tablet Chew 1 tablet by mouth as needed. Reported on 08/06/2015     cholecalciferol (VITAMIN D3) 25 MCG (1000 UT) tablet Take 1 tablet (1,000 Units total) by mouth daily.     Potassium 99 MG TABS Take daily by mouth.     vitamin C (ASCORBIC ACID) 500 MG tablet Take 500 mg by mouth daily.     famotidine (PEPCID) 20 MG tablet Take 20 mg by mouth daily. (Patient not taking: Reported on 03/20/2024)     LORazepam  (ATIVAN ) 0.5 MG tablet Take 1 tablet (0.5 mg total) 2 (two) times daily as needed by mouth for anxiety. (Patient not taking: Reported on 03/20/2024) 30 tablet 0   No facility-administered medications prior to visit.    ROS Reviewed all systems and reported negative except as above     Objective:   Vitals:   03/20/24 0825  BP: (!) 148/81  Pulse: 69  SpO2: 97%  Weight: 176 lb (79.8 kg)  Height: 5' 4 (1.626 m)    Physical Exam     CBC    Component Value Date/Time   WBC 6.8 04/26/2019 1742   RBC 5.11 04/26/2019 1742   HGB 14.6 04/26/2019 1742   HGB 13.5 06/03/2017 0841  HCT  45.6 04/26/2019 1742   HCT 41.2 06/03/2017 0841   PLT 222 04/26/2019 1742   PLT 201 06/03/2017 0841   MCV 89.2 04/26/2019 1742   MCV 89.8 06/03/2017 0841   MCH 28.6 04/26/2019 1742   MCHC 32.0 04/26/2019 1742   RDW 13.2 04/26/2019 1742   RDW 13.6 06/03/2017 0841   LYMPHSABS 1.3 05/26/2018 0748   LYMPHSABS 1.2 06/03/2017 0841   MONOABS 0.3 05/26/2018 0748   MONOABS 0.2 06/03/2017 0841   EOSABS 0.2 05/26/2018 0748   EOSABS 0.1 06/03/2017 0841   BASOSABS 0.0 05/26/2018 0748   BASOSABS 0.0 06/03/2017 0841     Chest imaging: I personally reviewed her multiple CT chests performed in July 2025, April 2025 and September 2020 as well as November 2018.  Overall impression Chronic progressive worsening in left upper lobe lingular parenchymal destruction suspected to be related to postradiation versus an indolent infection.  Less likely related to malignancy although still on the differential.  Also noted to have tree-in-bud nodularity and the right lung as well.    PFT: No PFTs on file   Labs: I reviewed her laboratory workup and no findings to explain current pulmonary findings.  Her most recent blood work is old from 2020.      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  Prior it was suspected to be some fibrotic changes related to postradiation therapy.  Will attempt and do sputum however, we will tentatively plan for bronchoscopy next week. ` Discussed with patient risks and benefits of bronchoscopy with BAL and transbronchial biopsies.  Patient is in agreement to pursue further workup. Bilateral malignant neoplasm of breast in female, unspecified estrogen receptor status, unspecified site of breast ([facility]) History of bilateral breast cancer with a positive family history of multiple malignancies.  Discussed that although less on the differential but  still possible that her findings could be related to malignancy.  Multiple lung nodules Small tree-in-bud and cavitating small lesions in the right lung appear also chronic since 2018  Orders Placed This Encounter  Procedures   Procedural/ Surgical Case Request: BRONCHOSCOPY, FLEXIBLE    Standing Status:   Future    Expiration Date:   03/20/2025    Pre-op diagnosis:   Cavitary lung nodule, multiple micronodules with some cystic changes   AFB Culture & Smear    Standing Status:   Future    Expected Date:   03/20/2024    Expiration Date:   03/20/2025   Fungus Culture & Smear    Standing Status:   Future    Expected Date:   03/20/2024    Expiration Date:   03/20/2025   Respiratory or Resp and Sputum Culture    Standing Status:   Future    Expiration Date:   03/20/2025   Fungitell Beta-D-Glucan   Sputum induction    Standing Status:   Future    Expiration Date:   03/20/2025    Where should this test be performed?:   [facility]        Zola Herter, MD  Pulmonary & Critical Care Office: [phone]

## 2024-03-20 NOTE — Telephone Encounter (Addendum)
 I spoke to [facility].Patient will need to be scheduled for tomorrow, as [facility] is unable to schedule the patient for 03/28/24 at 2:00 PM at this time. Will wait until tomorrow morning to call patient to let them know about their bronch appointment.

## 2024-03-20 NOTE — Progress Notes (Signed)
 Please schedule the following:  Provider performing procedure:Riggin Cuttino  Diagnosis: Cavitary lung lesion Which side if for nodule / mass? Left  Procedure: Flexible bronchoscopy   Has patient been spoken to by Provider and given informed consent? Yes  Anesthesia: General  Do you need Fluro? Yes  Duration of procedure: 30 Date: 03/29/24 Alternate Date: 03/28/24 Time:  PM 2:00  Location: Endoscopy- [facility] Does patient have OSA? No DM? NO Or Latex allergy? Yes  Medication Restriction/ Anticoagulate/Antiplatelet: None Pre-op Labs Ordered:determined by Anesthesia Imaging request: None

## 2024-03-20 NOTE — Patient Instructions (Addendum)
 You have a cavitating lung lesion on your chest CT. We suspect that its related to a chronic infection so we need to get a sputum sample tested, if you are not able to make a sputum sample, we will schedule a bronchoscopy for next week.   Please dont eat anything the night before your bronchoscopy.   I will call you to give you an update on when the bronchoscopy is scheduled   We will coordinate a follow up for you in the clinic

## 2024-03-21 NOTE — Assessment & Plan Note (Signed)
 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  Prior it was suspected to be some fibrotic changes related to postradiation therapy.  Will attempt and do sputum however, we will tentatively plan for bronchoscopy next week. ` Discussed with patient risks and benefits of bronchoscopy with BAL and transbronchial biopsies.  Patient is in agreement to pursue further workup.

## 2024-03-21 NOTE — Telephone Encounter (Signed)
 Patient has been scheduled for 03/28/24 at 2pm . Letter has been sent and patient is aware of appointment.

## 2024-03-23 ENCOUNTER — Encounter (HOSPITAL_COMMUNITY): Payer: Self-pay

## 2024-03-23 NOTE — Progress Notes (Signed)
 Attempted to obtain medical history for pre op call via telephone, unable to reach at this time. HIPAA compliant voicemail message left requesting return call to pre surgical testing department.

## 2024-03-28 ENCOUNTER — Ambulatory Visit (HOSPITAL_COMMUNITY)

## 2024-03-28 ENCOUNTER — Encounter (HOSPITAL_COMMUNITY): Payer: Self-pay

## 2024-03-28 ENCOUNTER — Other Ambulatory Visit: Payer: Self-pay

## 2024-03-28 ENCOUNTER — Ambulatory Visit (HOSPITAL_BASED_OUTPATIENT_CLINIC_OR_DEPARTMENT_OTHER): Admitting: Anesthesiology

## 2024-03-28 ENCOUNTER — Encounter (HOSPITAL_COMMUNITY): Admission: RE | Disposition: A | Discharge status: Home / Self Care | Payer: Self-pay | Source: Home / Self Care

## 2024-03-28 ENCOUNTER — Ambulatory Visit (HOSPITAL_COMMUNITY): Admitting: Anesthesiology

## 2024-03-28 ENCOUNTER — Ambulatory Visit (HOSPITAL_COMMUNITY): Admission: RE | Admit: 2024-03-28 | Discharge: 2024-03-28 | Disposition: A | Discharge status: Home / Self Care

## 2024-03-28 DIAGNOSIS — Z79899 Other long term (current) drug therapy: Secondary | ICD-10-CM | POA: Insufficient documentation

## 2024-03-28 DIAGNOSIS — Z923 Personal history of irradiation: Secondary | ICD-10-CM | POA: Insufficient documentation

## 2024-03-28 DIAGNOSIS — J984 Other disorders of lung: Secondary | ICD-10-CM

## 2024-03-28 DIAGNOSIS — K760 Fatty (change of) liver, not elsewhere classified: Secondary | ICD-10-CM | POA: Diagnosis not present

## 2024-03-28 DIAGNOSIS — Z87891 Personal history of nicotine dependence: Secondary | ICD-10-CM | POA: Diagnosis not present

## 2024-03-28 DIAGNOSIS — R918 Other nonspecific abnormal finding of lung field: Secondary | ICD-10-CM | POA: Insufficient documentation

## 2024-03-28 DIAGNOSIS — J8410 Pulmonary fibrosis, unspecified: Secondary | ICD-10-CM | POA: Diagnosis not present

## 2024-03-28 DIAGNOSIS — Z48813 Encounter for surgical aftercare following surgery on the respiratory system: Secondary | ICD-10-CM | POA: Diagnosis not present

## 2024-03-28 DIAGNOSIS — I2699 Other pulmonary embolism without acute cor pulmonale: Secondary | ICD-10-CM

## 2024-03-28 DIAGNOSIS — F419 Anxiety disorder, unspecified: Secondary | ICD-10-CM

## 2024-03-28 DIAGNOSIS — Z86711 Personal history of pulmonary embolism: Secondary | ICD-10-CM | POA: Insufficient documentation

## 2024-03-28 DIAGNOSIS — K219 Gastro-esophageal reflux disease without esophagitis: Secondary | ICD-10-CM | POA: Diagnosis not present

## 2024-03-28 DIAGNOSIS — I10 Essential (primary) hypertension: Secondary | ICD-10-CM | POA: Diagnosis not present

## 2024-03-28 DIAGNOSIS — J189 Pneumonia, unspecified organism: Secondary | ICD-10-CM | POA: Diagnosis not present

## 2024-03-28 DIAGNOSIS — Z853 Personal history of malignant neoplasm of breast: Secondary | ICD-10-CM | POA: Insufficient documentation

## 2024-03-28 DIAGNOSIS — Z801 Family history of malignant neoplasm of trachea, bronchus and lung: Secondary | ICD-10-CM | POA: Diagnosis not present

## 2024-03-28 HISTORY — PX: VIDEO BRONCHOSCOPY: SHX5072

## 2024-03-28 LAB — BODY FLUID CELL COUNT WITH DIFFERENTIAL
Eos, Fluid: 1 %
Lymphs, Fluid: 9 %
Monocyte-Macrophage-Serous Fluid: 9 % — ABNORMAL LOW (ref 50–90)
Neutrophil Count, Fluid: 81 % — ABNORMAL HIGH (ref 0–25)
Total Nucleated Cell Count, Fluid: 160 uL (ref 0–1000)

## 2024-03-28 SURGERY — BRONCHOSCOPY, WITH FLUOROSCOPY
Anesthesia: General | Laterality: Bilateral

## 2024-03-28 MED ORDER — SUGAMMADEX SODIUM 200 MG/2ML IV SOLN
INTRAVENOUS | Status: DC | PRN
  Administered 2024-03-28: 200 mg via INTRAVENOUS

## 2024-03-28 MED ORDER — PROPOFOL 10 MG/ML IV BOLUS
INTRAVENOUS | Status: DC | PRN
  Administered 2024-03-28: 200 mg via INTRAVENOUS

## 2024-03-28 MED ORDER — ONDANSETRON HCL 4 MG/2ML IJ SOLN
INTRAMUSCULAR | Status: DC | PRN
  Administered 2024-03-28: 4 mg via INTRAVENOUS

## 2024-03-28 MED ORDER — SODIUM CHLORIDE (PF) 0.9 % IJ SOLN
PREFILLED_SYRINGE | INTRAMUSCULAR | Status: DC | PRN
  Administered 2024-03-28: 4 mL

## 2024-03-28 MED ORDER — PROPOFOL 1000 MG/100ML IV EMUL
INTRAVENOUS | Status: AC
  Filled 2024-03-28: qty 100

## 2024-03-28 MED ORDER — FENTANYL CITRATE (PF) 100 MCG/2ML IJ SOLN
INTRAMUSCULAR | Status: AC
  Filled 2024-03-28: qty 2

## 2024-03-28 MED ORDER — CHLORHEXIDINE GLUCONATE 0.12 % MT SOLN
OROMUCOSAL | Status: AC
  Filled 2024-03-28: qty 15

## 2024-03-28 MED ORDER — FENTANYL CITRATE (PF) 100 MCG/2ML IJ SOLN
INTRAMUSCULAR | Status: DC | PRN
  Administered 2024-03-28 (×4): 50 ug via INTRAVENOUS

## 2024-03-28 MED ORDER — LIDOCAINE 2% (20 MG/ML) 5 ML SYRINGE
INTRAMUSCULAR | Status: DC | PRN
  Administered 2024-03-28: 100 mg via INTRAVENOUS

## 2024-03-28 MED ORDER — FENTANYL CITRATE (PF) 100 MCG/2ML IJ SOLN
INTRAMUSCULAR | Status: AC
Start: 2024-03-28 — End: 2024-03-28
  Filled 2024-03-28: qty 2

## 2024-03-28 MED ORDER — CHLORHEXIDINE GLUCONATE 0.12 % MT SOLN
OROMUCOSAL | Status: DC | PRN
  Administered 2024-03-28: 15 mL via OROMUCOSAL

## 2024-03-28 MED ORDER — DEXAMETHASONE SODIUM PHOSPHATE 10 MG/ML IJ SOLN
INTRAMUSCULAR | Status: DC | PRN
  Administered 2024-03-28: 10 mg via INTRAVENOUS

## 2024-03-28 MED ORDER — ROCURONIUM BROMIDE 10 MG/ML (PF) SYRINGE
PREFILLED_SYRINGE | INTRAVENOUS | Status: DC | PRN
  Administered 2024-03-28: 40 mg via INTRAVENOUS

## 2024-03-28 MED ORDER — SODIUM CHLORIDE 0.9 % IV SOLN
INTRAVENOUS | Status: AC | PRN
  Administered 2024-03-28: 500 mL via INTRAVENOUS

## 2024-03-28 NOTE — Anesthesia Preprocedure Evaluation (Addendum)
 Anesthesia Evaluation  Patient identified by MRN, date of birth, ID band Patient awake    Reviewed: Allergy & Precautions, NPO status , Patient's Chart, lab work & pertinent test results  History of Anesthesia Complications (+) PONV and history of anesthetic complications  Airway Mallampati: II  TM Distance: >3 FB Neck ROM: Full    Dental  (+) Partial Upper   Pulmonary former smoker, PE   Pulmonary exam normal breath sounds clear to auscultation       Cardiovascular Normal cardiovascular exam     Neuro/Psych   Anxiety        GI/Hepatic ,GERD  Medicated and Controlled,,  Endo/Other  negative endocrine ROS    Renal/GU      Musculoskeletal   Abdominal  (+) + obese  Peds  Hematology   Anesthesia Other Findings Cavitary lung nodule  Reproductive/Obstetrics                              Anesthesia Physical Anesthesia Plan  ASA: 3  Anesthesia Plan: General   Post-op Pain Management:    Induction: Intravenous  PONV Risk Score and Plan: 4 or greater and Ondansetron , Dexamethasone , Propofol  infusion, Midazolam  and Treatment may vary due to age or medical condition  Airway Management Planned: Oral ETT  Additional Equipment:   Intra-op Plan:   Post-operative Plan: Extubation in OR  Informed Consent: I have reviewed the patients History and Physical, chart, labs and discussed the procedure including the risks, benefits and alternatives for the proposed anesthesia with the patient or authorized representative who has indicated his/her understanding and acceptance.     Dental advisory given  Plan Discussed with: CRNA  Anesthesia Plan Comments:          Anesthesia Quick Evaluation

## 2024-03-28 NOTE — H&P (Signed)
 Brandi Bates is an 68 y.o. female.   Chief Complaint: Incidental cavitating lung mass in the lingula as well as small cavitating lung lesions bilaterally   HPI:    68 year old female with a past medical history of breast cancer diagnosed and treated in 2012-2013 status post bilateral mastectomy.  She is a former smoker and quit smoking in 98.  She states that she smoked about 2 packs/day for about 30 years.  She also had a PE at that time which with treated with anticoagulation for about 6 months.  History of GERD, hepatic steatosis.   She presented to the pulmonary clinic for further evaluation of cavitary lung lesion in her lingula which was first noted on a CT scan done for calcium scoring in April 2025.  A repeat CT scan was ordered and July which showed that the cavitating lung lesion slightly enlarged.    On review of her prior imaging she had a CT in 2020 which showed that at the site of her left upper lobe lung cavitary lesion there was postradiation changes as well as small areas of tree-in-bud and bronchiectasis with nodularities in the right lung suspected to be related to an atypical infection at that time.   Patient only reports that she has mild dyspnea on exertion but otherwise denies any fevers, chills, night sweats, loss of appetite, weight loss, cough or mucus production.   She reports a significant family history of malignancies.  Her brother died of a glioblastoma.  Both her parents died from lung cancer.  She reports that she has multiple cousins that have breast cancer.  That in addition to her own history of bilateral breast cancer.    Past Medical History:  Diagnosis Date   Allergy    Anxiety    Bladder cystocele 11/20/11   low-lying ct result   Breast cancer (HCC) 10/23/10   s/p L mastectomy, chemo/radiation, er/pr +, Her2 -   Breast cancer (HCC) 11/10/11   S/P right mastectomy   Breast wound    Left breast from radiation, skin graft   Chronic low back pain     everyday   Eczema    GERD (gastroesophageal reflux disease)    Hepatic steatosis 11/20/11   severe    History of cancer chemotherapy    memory issues from chemo   History of radiation therapy 04/23/11 thru 06/08/11   L breast   Lymphedema of arm    left   Migraines    Neuromuscular disorder (HCC)    raynauds syndrome    Numbness of feet    Pneumonia April 2013   PONV (postoperative nausea and vomiting)    Pulmonary thromboembolism (HCC) 11/20/11   ct positive acute w/i segmental branches of right lower lobe   Shortness of breath on exertion     Past Surgical History:  Procedure Laterality Date   ABDOMINAL HYSTERECTOMY     BILATERAL SALPINGOOPHORECTOMY  11/10/11   laparoscopy   BREAST SURGERY Bilateral    mastectomy   CHOLECYSTECTOMY  1999   DIAGNOSTIC LAPAROSCOPY     IRRIGATION AND DEBRIDEMENT ABSCESS  06/16/2011   Procedure: IRRIGATION AND DEBRIDEMENT ABSCESS;  Surgeon: Vicenta DELENA Poli, MD;  Location: [facility];  Service: General;  Laterality: Left;  incision and drainage of left chest wall abcess   MASTECTOMY  11/10/11   right; w/SNB   MASTECTOMY MODIFIED RADICAL  10/23/11   left   MASTECTOMY W/ SENTINEL NODE BIOPSY  11/10/2011   Procedure: MASTECTOMY WITH SENTINEL  LYMPH NODE BIOPSY;  Surgeon: Vicenta DELENA Poli, MD;  Location: [facility] OR;  Service: General;  Laterality: Right;   PORT-A-CATH REMOVAL Right 09/26/2014   Procedure: MINOR REMOVAL PORT-A-CATH;  Surgeon: Vicenta Poli, MD;  Location: [facility];  Service: General;  Laterality: Right;   PORTACATH PLACEMENT     right subclavian   TUBAL LIGATION  1979    Family History  Problem Relation Age of Onset   Cancer Mother        lung   Hypertension Mother    Cancer Father        lung   Cancer Brother        BRAIN CANCER   Hypertension Brother    Cancer Cousin         2 PATERNAL COUSINS - BREAST CA   Hypertension Sister    Hypertension Maternal Uncle    Hypertension Maternal Grandmother    Colon  cancer Other 43       colon METS to liver   Social History:  reports that she quit smoking about 26 years ago. Her smoking use included cigarettes. She started smoking about 54 years ago. She has a 56 pack-year smoking history. She has never used smokeless tobacco. She reports that she does not drink alcohol and does not use drugs.  Allergies:  Allergies  Allergen Reactions   Morphine  And Codeine Hives and Itching    All over the body   Omeprazole Magnesium  Nausea Only   Gabapentin Other (See Comments)    Unable to sleep   Prilosec [Omeprazole] Nausea Only   Latex Rash    Only where touched    Medications Prior to Admission  Medication Sig Dispense Refill   lisinopril-hydrochlorothiazide (ZESTORETIC) 20-12.5 MG tablet Take 1 tablet by mouth daily.     alendronate (FOSAMAX) 70 MG tablet SMARTSIG:1 Tablet(s) By Mouth     aspirin 81 MG tablet Take 81 mg by mouth daily. (Patient not taking: Reported on 03/23/2024)     atorvastatin (LIPITOR) 10 MG tablet Take 10 mg by mouth daily.     calcium carbonate (TUMS - DOSED IN MG ELEMENTAL CALCIUM) 500 MG chewable tablet Chew 1 tablet by mouth as needed. Reported on 08/06/2015     cholecalciferol (VITAMIN D3) 25 MCG (1000 UT) tablet Take 1 tablet (1,000 Units total) by mouth daily.     famotidine (PEPCID) 20 MG tablet Take 20 mg by mouth daily. (Patient taking differently: Take 20 mg by mouth 2 (two) times daily.)     LORazepam  (ATIVAN ) 0.5 MG tablet Take 1 tablet (0.5 mg total) 2 (two) times daily as needed by mouth for anxiety. (Patient not taking: Reported on 03/20/2024) 30 tablet 0   Potassium 99 MG TABS Take daily by mouth.     vitamin C (ASCORBIC ACID) 500 MG tablet Take 500 mg by mouth daily.      No results found for this or any previous visit (from the past 48 hours). No results found.  Review of Systems Negative except as above    There were no vitals taken for this visit. Physical Exam Constitutional:      Appearance: Normal  appearance.  HENT:     Head: Normocephalic and atraumatic.     Mouth/Throat:     Mouth: Mucous membranes are moist.  Cardiovascular:     Rate and Rhythm: Normal rate and regular rhythm.  Pulmonary:     Effort: Pulmonary effort is normal.  Abdominal:     General:  Abdomen is flat.     Palpations: Abdomen is soft.  Skin:    General: Skin is warm.  Neurological:     General: No focal deficit present.     Mental Status: She is alert. Mental status is at baseline.         Assessment/Plan Plan for bronchoscopy with BAL today as well as transbronchial biopsies under fluoro guidance.   Zola LOISE Herter, MD 03/28/2024, 12:31 PM

## 2024-03-28 NOTE — Procedures (Signed)
 Bronchoscopy Procedure Note  HARNEET NOBLETT  [id number]  1956/05/08  Date:03/28/24  Time:2:57 PM   Provider Performing:Trevaun Rendleman N Mahogony Gilchrest   Procedure(s):  Flexible bronchoscopy with bronchial alveolar lavage [id number]) and Transbronchial lung biopsy, single lobe ([id number])  Indication(s) Cavitating lung lesions   Consent Risks of the procedure as well as the alternatives and risks of each were explained to the patient and/or caregiver.  Consent for the procedure was obtained and is signed in the bedside chart  Anesthesia General    Time Out Verified patient identification, verified procedure, site/side was marked, verified correct patient position, special equipment/implants available, medications/allergies/relevant history reviewed, required imaging and test results available.   Sterile Technique Usual hand hygiene, masks, gowns, and gloves were used   Procedure Description Bronchoscope advanced through endotracheal tube and into airway.  Airways were examined down to subsegmental level with findings noted below.   Following diagnostic evaluation, BAL(s) performed in Lingula and Right middle lobe with normal saline and return of 20 and 25cc  fluid and Transbronchial biopsy(s) performed under fluoroscopic guidance in lingula  Findings: Airway anatomy and mucosa appear normal in the entire tracheobronchial tree. BAL performed from lingula and right middle lobe without complications. Forceps transbronchial biopsies performed from lingula x2. Patient noted to have oozing of blood from lingula post procedure. Hemostasis achieved with Iced saline and epinephrine .    Complications/Tolerance None; patient tolerated the procedure well. Chest X-ray is needed post procedure.   EBL Minimal   Specimen(s) BAL from Lingula BAL from right middle lobe Transbronchial biopsies performed from [facility]

## 2024-03-28 NOTE — Transfer of Care (Signed)
 Immediate Anesthesia Transfer of Care Note  Patient: Brandi Bates  Procedure(s) Performed: BRONCHOSCOPY, WITH FLUOROSCOPY (Bilateral)  Patient Location: PACU  Anesthesia Type:General  Level of Consciousness: sedated  Airway & Oxygen  Therapy: Patient Spontanous Breathing and Patient connected to face mask oxygen   Post-op Assessment: Report given to RN and Post -op Vital signs reviewed and stable  Post vital signs: Reviewed and stable  Last Vitals:  Vitals Value Taken Time  BP 178/73 03/28/24 15:02  Temp    Pulse 87 03/28/24 15:02  Resp 17 03/28/24 15:02  SpO2 99 % 03/28/24 15:02  Vitals shown include unfiled device data.  Last Pain:  Vitals:   03/28/24 1302  TempSrc: Temporal  PainSc: 0-No pain      Patients Stated Pain Goal: 0 (03/28/24 1302)  Complications: No notable events documented.

## 2024-03-28 NOTE — Anesthesia Procedure Notes (Signed)
 Procedure Name: Intubation Date/Time: 03/28/2024 2:14 PM  Performed by: Carleton Garnette SAUNDERS, CRNAPre-anesthesia Checklist: Patient identified, Emergency Drugs available, Suction available, Patient being monitored and Timeout performed Patient Re-evaluated:Patient Re-evaluated prior to induction Oxygen  Delivery Method: Circle system utilized Preoxygenation: Pre-oxygenation with 100% oxygen  Induction Type: IV induction Ventilation: Mask ventilation without difficulty Laryngoscope Size: Mac and 3 Grade View: Grade I Tube type: Oral Tube size: 8.0 mm Number of attempts: 1 Airway Equipment and Method: Stylet Placement Confirmation: ETT inserted through vocal cords under direct vision, positive ETCO2 and breath sounds checked- equal and bilateral Secured at: 22 cm Tube secured with: Tape Dental Injury: Teeth and Oropharynx as per pre-operative assessment

## 2024-03-29 SURGERY — BRONCHOSCOPY, FLEXIBLE
Anesthesia: General | Laterality: Bilateral

## 2024-03-29 NOTE — Anesthesia Postprocedure Evaluation (Signed)
 Anesthesia Post Note  Patient: Brandi Bates  Procedure(s) Performed: BRONCHOSCOPY, WITH FLUOROSCOPY (Bilateral)     Patient location during evaluation: Endoscopy Anesthesia Type: General Level of consciousness: awake Pain management: pain level controlled Vital Signs Assessment: post-procedure vital signs reviewed and stable Respiratory status: spontaneous breathing, nonlabored ventilation and respiratory function stable Cardiovascular status: blood pressure returned to baseline and stable Postop Assessment: no apparent nausea or vomiting Anesthetic complications: no   No notable events documented.  Last Vitals:  Vitals:   03/28/24 1520 03/28/24 1525  BP: (!) 145/74   Pulse: 74 69  Resp: 18 (!) 23  Temp:    SpO2: 94% 94%    Last Pain:  Vitals:   03/28/24 1525  TempSrc:   PainSc: 0-No pain                 Sherica Paternostro P Ysabel Stankovich

## 2024-03-30 ENCOUNTER — Ambulatory Visit: Payer: Self-pay

## 2024-03-30 LAB — ACID FAST SMEAR (AFB, MYCOBACTERIA)
Acid Fast Smear: NEGATIVE
Acid Fast Smear: NEGATIVE

## 2024-03-30 LAB — SURGICAL PATHOLOGY

## 2024-03-30 LAB — CYTOLOGY - NON PAP

## 2024-03-30 NOTE — Telephone Encounter (Signed)
 Called and patient and informed they are not related was most likely positioning on the table but if it gets worse call to be seen but are not likely related.verbalized understanding.NFN

## 2024-03-30 NOTE — Telephone Encounter (Signed)
 FYI Only or Action Required?: Action required by provider: clinical question for provider.  Patient is followed in Pulmonology for cavitary lesion of lung, last seen on 03/20/2024 by Zaida Zola SAILOR, MD.  Called Nurse Triage reporting Back Pain.  Symptoms began today.  Interventions attempted: OTC medications: Ibuprofen .  Symptoms are: unchanged.  Triage Disposition: Home Care  Patient/caregiver understands and will follow disposition?: [place] Patient advised to go to a local urgent care for her back pain if needed.         Copied from CRM [id number]. Topic: Clinical - Red Word Triage >> Mar 30, 2024 12:45 PM Rozanna MATSU wrote: Red Word that prompted transfer to Nurse Triage: PT HAD A BRONCHOSCOPY DONE ON 0902 AND NOW HAVING LOWER BACK PAIN         Reason for Disposition  Back pain  Answer Assessment - Initial Assessment Questions Patient calling to see if her back pain could be due to the bronchoscopy she had done yesterday. Please advise.          1. ONSET: When did the pain begin? (e.g., minutes, hours, days)     This morning  2. LOCATION: Where does it hurt? (upper, mid or lower back)     Lower back  3. SEVERITY: How bad is the pain?  (e.g., Scale 1-10; mild, moderate, or severe)     8/10 4. PATTERN: Is the pain constant? (e.g., yes, no; constant, intermittent)      Intermittent  5. RADIATION: Does the pain shoot into your legs or somewhere else?     Says in lower back  6. CAUSE:  What do you think is causing the back pain?      Unsure  7. BACK OVERUSE:  Any recent lifting of heavy objects, strenuous work or exercise?     No 8. MEDICINES: What have you taken so far for the pain? (e.g., nothing, acetaminophen , NSAIDS)     Ibuprofen   9. NEUROLOGIC SYMPTOMS: Do you have any weakness, numbness, or problems with bowel/bladder control?     No 10. OTHER SYMPTOMS: Do you have any other symptoms? (e.g., fever, abdomen  pain, burning with urination, blood in urine)       No  Protocols used: Back Pain-A-AH

## 2024-03-31 LAB — CULTURE, BAL-QUANTITATIVE W GRAM STAIN
Culture: NO GROWTH
Culture: 10000 — AB
Gram Stain: NONE SEEN

## 2024-03-31 LAB — FUNGUS STAIN

## 2024-03-31 LAB — CULTURE, RESPIRATORY W GRAM STAIN: Culture: NORMAL

## 2024-04-01 DIAGNOSIS — N10 Acute tubulo-interstitial nephritis: Secondary | ICD-10-CM | POA: Diagnosis not present

## 2024-04-01 DIAGNOSIS — R300 Dysuria: Secondary | ICD-10-CM | POA: Diagnosis not present

## 2024-04-01 DIAGNOSIS — M549 Dorsalgia, unspecified: Secondary | ICD-10-CM | POA: Diagnosis not present

## 2024-04-01 DIAGNOSIS — R509 Fever, unspecified: Secondary | ICD-10-CM | POA: Diagnosis not present

## 2024-04-02 LAB — AEROBIC/ANAEROBIC CULTURE W GRAM STAIN (SURGICAL/DEEP WOUND)

## 2024-04-03 LAB — CULTURE, RESPIRATORY W GRAM STAIN: Gram Stain: NONE SEEN

## 2024-04-07 ENCOUNTER — Emergency Department (HOSPITAL_COMMUNITY)

## 2024-04-07 ENCOUNTER — Inpatient Hospital Stay (HOSPITAL_COMMUNITY)
Admission: EM | Admit: 2024-04-07 | Discharge: 2024-04-10 | DRG: 178 | Disposition: A | Discharge status: Home / Self Care | Attending: Internal Medicine | Admitting: Internal Medicine

## 2024-04-07 ENCOUNTER — Other Ambulatory Visit: Payer: Self-pay

## 2024-04-07 ENCOUNTER — Encounter (HOSPITAL_COMMUNITY): Payer: Self-pay

## 2024-04-07 DIAGNOSIS — I7300 Raynaud's syndrome without gangrene: Secondary | ICD-10-CM | POA: Diagnosis present

## 2024-04-07 DIAGNOSIS — K219 Gastro-esophageal reflux disease without esophagitis: Secondary | ICD-10-CM | POA: Diagnosis present

## 2024-04-07 DIAGNOSIS — J189 Pneumonia, unspecified organism: Principal | ICD-10-CM | POA: Diagnosis present

## 2024-04-07 DIAGNOSIS — R918 Other nonspecific abnormal finding of lung field: Secondary | ICD-10-CM | POA: Diagnosis not present

## 2024-04-07 DIAGNOSIS — Z7982 Long term (current) use of aspirin: Secondary | ICD-10-CM | POA: Diagnosis not present

## 2024-04-07 DIAGNOSIS — Z1152 Encounter for screening for COVID-19: Secondary | ICD-10-CM | POA: Diagnosis not present

## 2024-04-07 DIAGNOSIS — Z885 Allergy status to narcotic agent status: Secondary | ICD-10-CM | POA: Diagnosis not present

## 2024-04-07 DIAGNOSIS — R5383 Other fatigue: Principal | ICD-10-CM

## 2024-04-07 DIAGNOSIS — Z79899 Other long term (current) drug therapy: Secondary | ICD-10-CM

## 2024-04-07 DIAGNOSIS — Z9221 Personal history of antineoplastic chemotherapy: Secondary | ICD-10-CM

## 2024-04-07 DIAGNOSIS — N390 Urinary tract infection, site not specified: Secondary | ICD-10-CM | POA: Diagnosis present

## 2024-04-07 DIAGNOSIS — R042 Hemoptysis: Secondary | ICD-10-CM | POA: Diagnosis present

## 2024-04-07 DIAGNOSIS — I2510 Atherosclerotic heart disease of native coronary artery without angina pectoris: Secondary | ICD-10-CM | POA: Diagnosis not present

## 2024-04-07 DIAGNOSIS — E785 Hyperlipidemia, unspecified: Secondary | ICD-10-CM | POA: Diagnosis present

## 2024-04-07 DIAGNOSIS — Z86711 Personal history of pulmonary embolism: Secondary | ICD-10-CM | POA: Diagnosis not present

## 2024-04-07 DIAGNOSIS — J470 Bronchiectasis with acute lower respiratory infection: Secondary | ICD-10-CM | POA: Diagnosis present

## 2024-04-07 DIAGNOSIS — I10 Essential (primary) hypertension: Secondary | ICD-10-CM | POA: Diagnosis present

## 2024-04-07 DIAGNOSIS — A319 Mycobacterial infection, unspecified: Secondary | ICD-10-CM | POA: Diagnosis present

## 2024-04-07 DIAGNOSIS — E876 Hypokalemia: Secondary | ICD-10-CM | POA: Diagnosis not present

## 2024-04-07 DIAGNOSIS — R059 Cough, unspecified: Secondary | ICD-10-CM | POA: Diagnosis not present

## 2024-04-07 DIAGNOSIS — R197 Diarrhea, unspecified: Secondary | ICD-10-CM | POA: Diagnosis present

## 2024-04-07 DIAGNOSIS — B965 Pseudomonas (aeruginosa) (mallei) (pseudomallei) as the cause of diseases classified elsewhere: Secondary | ICD-10-CM | POA: Diagnosis present

## 2024-04-07 DIAGNOSIS — Z9013 Acquired absence of bilateral breasts and nipples: Secondary | ICD-10-CM | POA: Diagnosis not present

## 2024-04-07 DIAGNOSIS — Z800 Family history of malignant neoplasm of digestive organs: Secondary | ICD-10-CM

## 2024-04-07 DIAGNOSIS — R0989 Other specified symptoms and signs involving the circulatory and respiratory systems: Secondary | ICD-10-CM | POA: Diagnosis not present

## 2024-04-07 DIAGNOSIS — R531 Weakness: Secondary | ICD-10-CM | POA: Diagnosis not present

## 2024-04-07 DIAGNOSIS — Z888 Allergy status to other drugs, medicaments and biological substances status: Secondary | ICD-10-CM

## 2024-04-07 DIAGNOSIS — Z853 Personal history of malignant neoplasm of breast: Secondary | ICD-10-CM

## 2024-04-07 DIAGNOSIS — Z7983 Long term (current) use of bisphosphonates: Secondary | ICD-10-CM | POA: Diagnosis not present

## 2024-04-07 DIAGNOSIS — K5900 Constipation, unspecified: Secondary | ICD-10-CM | POA: Diagnosis not present

## 2024-04-07 DIAGNOSIS — Z8249 Family history of ischemic heart disease and other diseases of the circulatory system: Secondary | ICD-10-CM

## 2024-04-07 DIAGNOSIS — J471 Bronchiectasis with (acute) exacerbation: Secondary | ICD-10-CM | POA: Diagnosis present

## 2024-04-07 DIAGNOSIS — R519 Headache, unspecified: Secondary | ICD-10-CM | POA: Diagnosis not present

## 2024-04-07 DIAGNOSIS — Z91040 Latex allergy status: Secondary | ICD-10-CM | POA: Diagnosis not present

## 2024-04-07 DIAGNOSIS — Z923 Personal history of irradiation: Secondary | ICD-10-CM | POA: Diagnosis not present

## 2024-04-07 DIAGNOSIS — J151 Pneumonia due to Pseudomonas: Principal | ICD-10-CM | POA: Diagnosis present

## 2024-04-07 DIAGNOSIS — F419 Anxiety disorder, unspecified: Secondary | ICD-10-CM | POA: Diagnosis present

## 2024-04-07 DIAGNOSIS — Z808 Family history of malignant neoplasm of other organs or systems: Secondary | ICD-10-CM

## 2024-04-07 DIAGNOSIS — J984 Other disorders of lung: Secondary | ICD-10-CM | POA: Diagnosis not present

## 2024-04-07 DIAGNOSIS — Z87891 Personal history of nicotine dependence: Secondary | ICD-10-CM | POA: Diagnosis not present

## 2024-04-07 LAB — URINALYSIS, ROUTINE W REFLEX MICROSCOPIC
Bilirubin Urine: NEGATIVE
Glucose, UA: NEGATIVE mg/dL
Ketones, ur: NEGATIVE mg/dL
Nitrite: NEGATIVE
Protein, ur: NEGATIVE mg/dL
Specific Gravity, Urine: 1.017 (ref 1.005–1.030)
pH: 5 (ref 5.0–8.0)

## 2024-04-07 LAB — BASIC METABOLIC PANEL WITH GFR
Anion gap: 16 — ABNORMAL HIGH (ref 5–15)
BUN: 12 mg/dL (ref 8–23)
CO2: 20 mmol/L — ABNORMAL LOW (ref 22–32)
Calcium: 9 mg/dL (ref 8.9–10.3)
Chloride: 104 mmol/L (ref 98–111)
Creatinine, Ser: 0.69 mg/dL (ref 0.44–1.00)
GFR, Estimated: >60 mL/min (ref >60)
Glucose, Bld: 113 mg/dL — ABNORMAL HIGH (ref 70–99)
Potassium: 3.6 mmol/L (ref 3.5–5.1)
Sodium: 140 mmol/L (ref 135–145)

## 2024-04-07 LAB — CBC
HCT: 38 % (ref 36.0–46.0)
Hemoglobin: 11.8 g/dL — ABNORMAL LOW (ref 12.0–15.0)
MCH: 27.6 pg (ref 26.0–34.0)
MCHC: 31.1 g/dL (ref 30.0–36.0)
MCV: 88.8 fL (ref 80.0–100.0)
Platelets: 302 K/uL (ref 150–400)
RBC: 4.28 MIL/uL (ref 3.87–5.11)
RDW: 13.8 % (ref 11.5–15.5)
WBC: 9.4 K/uL (ref 4.0–10.5)
nRBC: 0 % (ref 0.0–0.2)

## 2024-04-07 MED ORDER — FAMOTIDINE 20 MG PO TABS
20.0000 mg | ORAL_TABLET | Freq: Every day | ORAL | Status: DC
Start: 2024-04-07 — End: 2024-04-10
  Administered 2024-04-07 – 2024-04-10 (×4): 20 mg via ORAL
  Filled 2024-04-07 (×4): qty 1

## 2024-04-07 MED ORDER — TRAZODONE HCL 50 MG PO TABS: 25.0000 mg | ORAL_TABLET | Freq: Every evening | ORAL | Status: DC | PRN

## 2024-04-07 MED ORDER — LISINOPRIL 20 MG PO TABS
20.0000 mg | ORAL_TABLET | Freq: Every day | ORAL | Status: DC
  Administered 2024-04-07 – 2024-04-10 (×4): 20 mg via ORAL
  Filled 2024-04-07 (×3): qty 1
  Filled 2024-04-07: qty 2

## 2024-04-07 MED ORDER — ACETAMINOPHEN 325 MG PO TABS
650.0000 mg | ORAL_TABLET | Freq: Four times a day (QID) | ORAL | Status: DC | PRN
  Administered 2024-04-07 – 2024-04-09 (×4): 650 mg via ORAL
  Filled 2024-04-07 (×4): qty 2

## 2024-04-07 MED ORDER — SODIUM CHLORIDE 0.9 % IV SOLN
100.0000 mg | Freq: Two times a day (BID) | INTRAVENOUS | Status: DC
  Administered 2024-04-08 – 2024-04-09 (×4): 100 mg via INTRAVENOUS
  Filled 2024-04-07 (×5): qty 100

## 2024-04-07 MED ORDER — ATORVASTATIN CALCIUM 10 MG PO TABS
10.0000 mg | ORAL_TABLET | Freq: Every day | ORAL | Status: DC
  Administered 2024-04-07 – 2024-04-10 (×4): 10 mg via ORAL
  Filled 2024-04-07 (×4): qty 1

## 2024-04-07 MED ORDER — ONDANSETRON HCL 4 MG PO TABS: 4.0000 mg | ORAL_TABLET | Freq: Four times a day (QID) | ORAL | Status: DC | PRN

## 2024-04-07 MED ORDER — SODIUM CHLORIDE 0.9 % IV BOLUS
1000.0000 mL | Freq: Once | INTRAVENOUS | Status: AC
  Administered 2024-04-07: 1000 mL via INTRAVENOUS

## 2024-04-07 MED ORDER — HYDROCHLOROTHIAZIDE 12.5 MG PO TABS
12.5000 mg | ORAL_TABLET | Freq: Every day | ORAL | Status: DC
  Administered 2024-04-07 – 2024-04-10 (×4): 12.5 mg via ORAL
  Filled 2024-04-07 (×4): qty 1

## 2024-04-07 MED ORDER — SODIUM CHLORIDE 0.9 % IV SOLN
500.0000 mg | Freq: Once | INTRAVENOUS | Status: DC
  Administered 2024-04-07: 500 mg via INTRAVENOUS
  Filled 2024-04-07: qty 5

## 2024-04-07 MED ORDER — METOCLOPRAMIDE HCL 5 MG/ML IJ SOLN
10.0000 mg | Freq: Once | INTRAMUSCULAR | Status: AC
  Administered 2024-04-07: 10 mg via INTRAVENOUS
  Filled 2024-04-07: qty 2

## 2024-04-07 MED ORDER — ALBUTEROL SULFATE (2.5 MG/3ML) 0.083% IN NEBU: 2.5000 mg | INHALATION_SOLUTION | RESPIRATORY_TRACT | Status: DC | PRN

## 2024-04-07 MED ORDER — DIPHENHYDRAMINE HCL 50 MG/ML IJ SOLN
12.5000 mg | Freq: Once | INTRAMUSCULAR | Status: AC
  Administered 2024-04-07: 12.5 mg via INTRAVENOUS
  Filled 2024-04-07: qty 1

## 2024-04-07 MED ORDER — IOHEXOL 350 MG/ML SOLN
100.0000 mL | Freq: Once | INTRAVENOUS | Status: AC | PRN
  Administered 2024-04-07: 100 mL via INTRAVENOUS

## 2024-04-07 MED ORDER — ACETAMINOPHEN 650 MG RE SUPP: 650.0000 mg | Freq: Four times a day (QID) | RECTAL | Status: DC | PRN

## 2024-04-07 MED ORDER — ONDANSETRON HCL 4 MG/2ML IJ SOLN: 4.0000 mg | Freq: Four times a day (QID) | INTRAMUSCULAR | Status: DC | PRN

## 2024-04-07 MED ORDER — SODIUM CHLORIDE 0.9 % IV SOLN
1.0000 g | Freq: Once | INTRAVENOUS | Status: AC
  Administered 2024-04-07: 1 g via INTRAVENOUS
  Filled 2024-04-07: qty 10

## 2024-04-07 MED ORDER — SODIUM CHLORIDE 0.9 % IV SOLN
2.0000 g | Freq: Three times a day (TID) | INTRAVENOUS | Status: DC
  Administered 2024-04-07 – 2024-04-10 (×9): 2 g via INTRAVENOUS
  Filled 2024-04-07 (×9): qty 12.5

## 2024-04-07 MED ORDER — LISINOPRIL-HYDROCHLOROTHIAZIDE 20-12.5 MG PO TABS
1.0000 | ORAL_TABLET | Freq: Every day | ORAL | Status: DC
Start: 2024-04-07 — End: 2024-04-07

## 2024-04-07 NOTE — ED Notes (Signed)
 Patient transported to X-ray

## 2024-04-07 NOTE — ED Provider Notes (Signed)
 [place] [facility]MENT AT [facility] Provider Note   CSN: [id number] Arrival date & time: 04/07/24  9248     Patient presents with: Fatigue  HPI Brandi Bates is a 68 y.o. female with a history of breast cancer, pulmonary embolism, recent lung biopsy presenting for fatigue and hemoptysis.  Lung biopsy occurred this past Tuesday.  States she has been generally more tired than usual in the last 3 to 4 days.  Also woke up this morning around 2 AM and started to cough up bright red blood.  She states it was an alarming amount prompting her to come in.  Also reports that she fell on Friday and has a large bruise to her left abdomen.  Unsure if she hit her head.  Also reporting a left-sided headache that she has had for about a week that started prior to the procedure.  Also finishing up a course of Cipro for a kidney infection.  Denies any urinary symptoms.  Reports pain around the bruise in her left abdomen but no other abdominal pain.   HPI     Prior to Admission medications   Medication Sig Start Date End Date Taking? Authorizing Provider  alendronate (FOSAMAX) 70 MG tablet SMARTSIG:1 Tablet(s) By Mouth 02/11/24   [provider]  aspirin 81 MG tablet Take 81 mg by mouth daily. Patient not taking: Reported on 03/23/2024    [provider]  Aspirin-Acetaminophen -Caffeine (GOODY HEADACHE PO) Take by mouth.    [provider]  atorvastatin  (LIPITOR) 10 MG tablet Take 10 mg by mouth daily. 11/16/23   [provider]  calcium  carbonate (TUMS - DOSED IN MG ELEMENTAL CALCIUM ) 500 MG chewable tablet Chew 1 tablet by mouth as needed. Reported on 08/06/2015    [provider]  cholecalciferol (VITAMIN D3) 25 MCG (1000 UT) tablet Take 1 tablet (1,000 Units total) by mouth daily. 06/02/18   Magrinat, Sandria BROCKS, MD  famotidine  (PEPCID ) 20 MG tablet Take 20 mg by mouth daily. Patient taking differently: Take 20 mg by mouth 2 (two) times daily.     [provider]  lisinopril -hydrochlorothiazide  (ZESTORETIC ) 20-12.5 MG tablet Take 1 tablet by mouth daily.    [provider]  LORazepam  (ATIVAN ) 0.5 MG tablet Take 1 tablet (0.5 mg total) 2 (two) times daily as needed by mouth for anxiety. Patient not taking: No sig reported 06/03/17   Magrinat, Sandria BROCKS, MD  Potassium 99 MG TABS Take daily by mouth.    [provider]  vitamin C (ASCORBIC ACID) 500 MG tablet Take 500 mg by mouth daily.    [provider]  gabapentin (NEURONTIN) 300 MG capsule daily. 09/17/11 10/02/11  [provider]    Allergies: Morphine  and codeine, Omeprazole magnesium , Gabapentin, Prilosec [omeprazole], and Latex    Review of Systems See HPI   Physical Exam   Vitals:   04/07/24 0756 04/07/24 1100  BP: (!) 158/76 (!) 145/71  Pulse: 86 75  Resp: 18 16  Temp: 98.9 F (37.2 C) 98.5 F (36.9 C)  SpO2: 96% 96%    CONSTITUTIONAL:  well-appearing, NAD NEURO:  Alert and oriented x 3, CN 3-12 grossly intact EYES:  eyes equal and reactive ENT/NECK:  Supple, no stridor  CARDIO:  regular rate and rhythm, appears well-perfused  PULM:  No respiratory distress, left sided rales, no wheezing, air entry normal GI/GU:  non-distended, soft, hematoma noted in LLQ extending to the left flank MSK/SPINE:  No gross deformities, no edema,  moves all extremities, no spinous tenderness SKIN:  no rash  *Additional and/or pertinent findings included in MDM below    (all labs ordered are listed, but only abnormal results are displayed) Labs Reviewed  CBC - Abnormal; Notable for the following components:      Result Value   Hemoglobin 11.8 (*)    All other components within normal limits  BASIC METABOLIC PANEL WITH GFR - Abnormal; Notable for the following components:   CO2 20 (*)    Glucose, Bld 113 (*)    Anion gap 16 (*)    All other components within normal limits  URINALYSIS, ROUTINE W REFLEX MICROSCOPIC - Abnormal; Notable  for the following components:   APPearance HAZY (*)    Hgb urine dipstick MODERATE (*)    Leukocytes,Ua SMALL (*)    Bacteria, UA RARE (*)    All other components within normal limits    EKG: None  Radiology: CT L-SPINE NO CHARGE Result Date: 04/07/2024 CLINICAL DATA:  68 year old female with recent lung biopsy, hemoptysis, suspected urinary infection, on antibiotics, fatigue, increasing headache. History of breast cancer. EXAM: CT LUMBAR SPINE WITH CONTRAST TECHNIQUE: Technique: Multiplanar CT images of the lumbar spine were reconstructed from contemporary CT of the Abdomen and Pelvis. RADIATION DOSE REDUCTION: This exam was performed according to the departmental dose-optimization program which includes automated exposure control, adjustment of the mA and/or kV according to patient size and/or use of iterative reconstruction technique. CONTRAST:  No additional COMPARISON:  CTA Chest,, CT abdomen, and Pelvis today reported separately. CT Abdomen and Pelvis 11/20/2011. FINDINGS: Segmentation: Transitional anatomy, partially sacralized L5 level. Partially this digital L5-S1 disc space. Correlation with radiographs is recommended prior to any operative intervention. Alignment: Stable lordosis since 2013. Minimal scoliosis. No spondylolisthesis. Vertebrae: Maintained vertebral height. Bone mineralization appears stable since 2013. Lumbar vertebrae appear intact. Visible sacrum and SI joints intact. No acute or suspicious osseous lesion identified. Paraspinal and other soft tissues: Abdomen and pelvis reported separately. Negative lumbar paraspinal soft tissues. Disc levels: Mild for age lumbar spine degeneration appears stable since 2013. Capacious spinal canal. IMPRESSION: 1. Transitional anatomy with partially sacralized L5 level, otherwise normal for age CT appearance of the Lumbar Spine. 2. CTA Chest, CT Abdomen and Pelvis today reported separately. Electronically Signed   By: VEAR Hurst M.D.   On:  04/07/2024 10:29   CT ABDOMEN PELVIS W CONTRAST Result Date: 04/07/2024 CLINICAL DATA:  68 year old female with recent lung biopsy, hemoptysis, suspected urinary infection, on antibiotics, fatigue, increasing headache. History of breast cancer. EXAM: CT ABDOMEN AND PELVIS WITH CONTRAST TECHNIQUE: Multidetector CT imaging of the abdomen and pelvis was performed using the standard protocol following bolus administration of intravenous contrast. RADIATION DOSE REDUCTION: This exam was performed according to the departmental dose-optimization program which includes automated exposure control, adjustment of the mA and/or kV according to patient size and/or use of iterative reconstruction technique. CONTRAST:  100mL OMNIPAQUE  IOHEXOL  350 MG/ML SOLN COMPARISON:  CTA chest and lumbar spine today reported separately. CT Abdomen and Pelvis 11/20/2011. FINDINGS: Lower chest: Stable to CTA reported separately today. Hepatobiliary: Chronic cholecystectomy. Hepatic steatosis is less apparent. No discrete liver lesion. Pancreas: Negative. Spleen: Stable at the upper limits of normal. Adrenals/Urinary Tract: Negative adrenal glands and kidneys. No delayed excretory images. Renal collecting systems and ureters are diminutive. Unremarkable urinary bladder. Chronic pelvic phleboliths. Stomach/Bowel: Increased large bowel retained stool compared to 2013. Mild large bowel redundancy. Normal retrocecal appendix containing gas on series 4, image 68. Nondilated  small bowel. Distal esophageal phrenic ampulla, less likely small hiatal hernia. Otherwise negative stomach and duodenum. No pneumoperitoneum, free fluid, mesenteric inflammation. Vascular/Lymphatic: Aortoiliac calcified atherosclerosis. Normal caliber abdominal aorta. Major arterial structures remain patent. Portal venous system is patent. No lymphadenopathy. Reproductive: Chronically absent uterus, diminutive or absent ovaries. Other: No pelvis free fluid. Musculoskeletal:  Lumbar spine reported separately today. Otherwise no acute or suspicious osseous lesion identified. IMPRESSION: 1. No acute or inflammatory process identified in the abdomen or pelvis. Aortic Atherosclerosis (ICD10-I70.0). 2. Abnormal lung bases, see Chest CTA today reported separately. 3. Lumbar spine reported separately today. Electronically Signed   By: VEAR Hurst M.D.   On: 04/07/2024 10:27   CT Angio Chest PE W/Cm &/Or Wo Cm Result Date: 04/07/2024 CLINICAL DATA:  68 year old female with recent lung biopsy, hemoptysis, suspected urinary infection, on antibiotics, fatigue, increasing headache. History of breast cancer. EXAM: CT ANGIOGRAPHY CHEST WITH CONTRAST TECHNIQUE: Multidetector CT imaging of the chest was performed using the standard protocol during bolus administration of intravenous contrast. Multiplanar CT image reconstructions and MIPs were obtained to evaluate the vascular anatomy. RADIATION DOSE REDUCTION: This exam was performed according to the departmental dose-optimization program which includes automated exposure control, adjustment of the mA and/or kV according to patient size and/or use of iterative reconstruction technique. CONTRAST:  OMNIPAQUE  IOHEXOL  350 MG/ML SOLN COMPARISON:  Chest CT without contrast 02/08/2024. FINDINGS: Cardiovascular: Good contrast bolus timing in the pulmonary arterial tree. No pulmonary artery filling defect is identified. Calcified coronary artery atherosclerosis (series 11, image 170) redemonstrated. Heart size remains normal. No pericardial effusion. Negative thoracic aorta aside from mild atherosclerosis. Mediastinum/Nodes: Negative for mediastinal mass or lymphadenopathy. Lungs/Pleura: Lower lung volumes compared to July. Major airways remain patent. Chronic anterior left upper lobe architectural distortion and cavitary lesion (series 13, image 57) size and configuration of which not significantly changed from July, and adhesion to the overlying pleura  appears stable. However, extensive new surrounding peribronchial and airspace opacity ranging from sub solid opacity to new surrounding consolidation. And widespread similar extensive peribronchial sub solid and solid opacity scattered in the right upper lobe, both lower lobes. Comparatively mild involvement in the right middle lobe. No areas of obvious contrast extravasation in the lungs. No pneumothorax or pleural effusion. Upper Abdomen: CT Abdomen and Pelvis reported separately. Musculoskeletal: Stable visualized osseous structures. No acute or suspicious osseous lesion identified. Bilateral mastectomy. Review of the MIP images confirms the above findings. IMPRESSION: 1. Negative for acute pulmonary embolus. And no obvious contrast extravasation into the lungs. 2. Chronic left upper lobe architectural distortion and cavitary lesion with extensive new surrounding and widespread bilateral pulmonary airspace disease with areas of early consolidation. No associated pleural effusion. Major airways remain patent. Broad differential considerations including bilateral alveolar hemorrhage, vasculitis, pneumonia, drug reaction or other non-infectious inflammation. 3. CT Abdomen and Pelvis reported separately. 4. Calcified coronary artery,  Aortic Atherosclerosis (ICD10-I70.0). Electronically Signed   By: VEAR Hurst M.D.   On: 04/07/2024 10:23   CT Head Wo Contrast Result Date: 04/07/2024 CLINICAL DATA:  68 year old female with recent lung biopsy, hemoptysis, suspected urinary infection, on antibiotics, fatigue, increasing headache. EXAM: CT HEAD WITHOUT CONTRAST TECHNIQUE: Contiguous axial images were obtained from the base of the skull through the vertex without intravenous contrast. RADIATION DOSE REDUCTION: This exam was performed according to the departmental dose-optimization program which includes automated exposure control, adjustment of the mA and/or kV according to patient size and/or use of iterative  reconstruction technique. COMPARISON:  None Available.  FINDINGS: Brain: Cerebral volume is within normal limits for age. No midline shift, ventriculomegaly, mass effect, evidence of mass lesion, intracranial hemorrhage or evidence of cortically based acute infarction. Gray-white matter differentiation is within normal limits throughout the brain. Vascular: Calcified atherosclerosis at the skull base. No suspicious intracranial vascular hyperdensity. Skull: Intact. Small and circumscribed left parietal bone lucency is likely benign on series 5, image 33. No acute osseous abnormality identified. Sinuses/Orbits: Visualized paranasal sinuses and mastoids are clear. Other: Negative orbit and scalp soft tissues, postoperative changes to both globes. IMPRESSION: Normal for age noncontrast Head CT. Electronically Signed   By: VEAR Hurst M.D.   On: 04/07/2024 10:15   DG Chest 2 View Result Date: 04/07/2024 EXAM: 2 VIEW(S) XRAY OF THE CHEST 04/07/2024 08:35:32 AM COMPARISON: 03/28/24. CLINICAL HISTORY: Hemoptysis. Pt is coughing up blood and feeling very weak. FINDINGS: LUNGS AND PLEURA: Redemonstrated left upper lobe cavitary lung mass. Interval development of diffuse interstitial opacities with multifocal airspace densities within the right lung. No pleural fluid or pneumothorax identified. HEART AND MEDIASTINUM: No acute abnormality of the cardiac and mediastinal silhouettes. BONES AND SOFT TISSUES: Surgical clips noted in the left axilla. No acute osseous abnormality. IMPRESSION: 1. Interval development of diffuse interstitial opacities with multifocal airspace densities within the right lung. Differential considerations include diffuse pulmonary edema versus multifocal infection. 2. Redemonstrated left upper lobe cavitary lung mass. Electronically signed by: Waddell Calk MD 04/07/2024 08:43 AM EDT RP Workstation: [id number]     Procedures   Medications Ordered in the ED  cefTRIAXone  (ROCEPHIN ) 1 g in sodium  chloride 0.9 % 100 mL IVPB (has no administration in time range)  azithromycin  (ZITHROMAX ) 500 mg in sodium chloride  0.9 % 250 mL IVPB (has no administration in time range)  sodium chloride  0.9 % bolus 1,000 mL (0 mLs Intravenous Stopped 04/07/24 1023)  metoCLOPramide  (REGLAN ) injection 10 mg (10 mg Intravenous Given 04/07/24 0848)  diphenhydrAMINE  (BENADRYL ) injection 12.5 mg (12.5 mg Intravenous Given 04/07/24 0848)  iohexol  (OMNIPAQUE ) 350 MG/ML injection 100 mL (100 mLs Intravenous Contrast Given 04/07/24 9047)                                    Medical Decision Making Amount and/or Complexity of Data Reviewed Radiology: ordered.  Risk Prescription drug management. Decision regarding hospitalization.   Initial Impression and Ddx 68 year old well-appearing female presenting for hemoptysis, fatigue and recent fall.  Exam notable for a hematoma in the left lower abdomen and rales in the left upper lobe.  DDx includes PE, pulmonary hemorrhage, pneumonia, traumatic intra-abdominal injury, spinal fracture, sepsis, other. Patient PMH that increases complexity of ED encounter:  history of breast cancer, pulmonary embolism, recent lung biopsy   Interpretation of Diagnostics - I independent reviewed and interpreted the labs as followed: anemia 11.8  - I independently visualized the following imaging with scope of interpretation limited to determining acute life threatening conditions related to emergency care: CT angio chest, which revealed  Chronic left upper lobe architectural distortion and cavitary lesion with extensive new surrounding and widespread bilateral pulmonary airspace disease with areas of early consolidation. No associated pleural effusion. Major airways remain patent. Broad differential considerations including bilateral alveolar hemorrhage, vasculitis, pneumonia, drug reaction or other non-infectious inflammation  - I personally reviewed and interpreted EKG which revealed sinus  rhythm  Patient Reassessment and Ultimate Disposition/Management Discussed patient with Dr. Theophilus of pulmonology and critical care who advised to  admit on antibiotics for observation.  Started IV ceftriaxone  and azithromycin . He mentioned that she may need bronchoscopy if she starts to cough up blood again.  No witnessed hemoptysis during this encounter.  Workup does not suggest sepsis.  Admitted to hospital service with Dr. Zella.  Has remained stable on room air.  Patient management required discussion with the following services or consulting groups:  Hospitalist Service and Intensivist Service  Complexity of Problems Addressed Acute complicated illness or Injury  Additional Data Reviewed and Analyzed Further history obtained from: Past medical history and medications listed in the EMR and Prior ED visit notes  Patient Encounter Risk Assessment Consideration of hospitalization      Final diagnoses:  Fatigue, unspecified type  Hemoptysis    ED Discharge Orders     None          Lang Norleen POUR, PA-C 04/07/24 1146    Neysa Caron PARAS, DO 04/07/24 1528

## 2024-04-07 NOTE — ED Triage Notes (Addendum)
 Patient had a scope to check out her lungs last Tuesday along with a lung biopsy. Had some type of infection on her lungs but was told it was no cancer. Began coughing up bright red blood today. Has a kidney infection that began last Thursday. Has no energy, feeling drained. No vomiting. Is currently taking Cipro antibiotic for kidney infection.

## 2024-04-07 NOTE — Consult Note (Signed)
 NAME:  Brandi Bates, MRN:  [id number], DOB:  1955/10/26, LOS: 0 ADMISSION DATE:  04/07/2024, CONSULTATION DATE: 04/07/2024 REFERRING MD: EDP, CHIEF COMPLAINT: Hemoptysis  History of Present Illness:  The patient, with bronchiectasis, cavitary lung lesion presents with hemoptysis.  Hemoptysis - Hemoptysis occurred early this morning during a coughing episode lasting approximately fifteen minutes - Blood was noticed after the episode, as it was dark at the time of coughing - Single episode of hemoptysis with no recurrence since this morning  Cavitary left lingula lesion, bronchiectasis and airway infection - Bronchoscopy performed on Tuesday, March 28, 2024 - Cultures are growing pseudomonas infection and positive AFB smear.  Pathology showed inflammation, granulomas and no evidence of malignancy - Initial post-procedure course was unremarkable  Urinary tract infection symptoms - Severe dysuria developed on Thursday following bronchoscopy, described as 'felt like razors coming out' when urinating - Difficulty voiding urine - Evaluated at urgent care at [facility] urgent care in [place] - Diagnosed with severe kidney infection - Completed a course of ciprofloxacin today - Significant improvement in urinary symptoms since starting antibiotics  Recent trauma - Sustained a fall on Friday night  Pertinent  Medical History    has a past medical history of Allergy, Anxiety, Bladder cystocele (11/20/11), Breast cancer (HCC) (10/23/10), Breast cancer (HCC) (11/10/11), Breast wound, Chronic low back pain, Eczema, GERD (gastroesophageal reflux disease), Hepatic steatosis (11/20/11), History of cancer chemotherapy, History of radiation therapy (04/23/11 thru 06/08/11), Lymphedema of arm, Migraines, Neuromuscular disorder (HCC), Numbness of feet, Pneumonia (April 2013), PONV (postoperative nausea and vomiting), Pulmonary thromboembolism (HCC) (11/20/11), and Shortness of breath on exertion.    Significant Hospital Events: Including procedures, antibiotic start and stop dates in addition to other pertinent events     Interim History / Subjective:    Objective    Blood pressure (!) 145/71, pulse 75, temperature 98.5 F (36.9 C), temperature source Oral, resp. rate 16, height 5' 4 (1.626 m), weight 79.4 kg, SpO2 96%.        Intake/Output Summary (Last 24 hours) at 04/07/2024 1206 Last data filed at 04/07/2024 1023 Gross per 24 hour  Intake 1000 ml  Output --  Net 1000 ml   Filed Weights   04/07/24 0756  Weight: 79.4 kg    Examination: Gen:      No acute distress HEENT:  EOMI, sclera anicteric Neck:     No masses; no thyromegaly Lungs:    Clear to auscultation bilaterally; normal respiratory effort CV:         Regular rate and rhythm; no murmurs Abd:      + bowel sounds; soft, non-tender; no palpable masses, no distension Ext:    No edema; adequate peripheral perfusion Neuro: alert and oriented x 3 Psych: normal mood and affect   Lab/imaging reviewed Significant for BUN/creatinine 12/0.69 WBC 9.4, hemoglobin 11.8, platelets 302  BAL cultures with Pseudomonas,, positive AFB CTA chest with no pulmonary embolism.  Chronic lingula scarring, cavitary lesion.  New surrounding bilateral airspace disease with consolidation.  Resolved problem list   Assessment and Plan  Hemoptysis Acute episode of hemoptysis occurred early this morning, lasting approximately fifteen minutes. No further episodes reported since. CT chest shows no pulmonary embolism but reveals bilateral infiltrates suggestive of pneumonia versus hemoptysis. Hemoptysis may be related to chronic bronchiectasis, cavitary lesion and infection. - Admit for overnight observation to monitor hemoptysis. - If hemoptysis worsens, consider bronchoscopy to identify bleeding source. - Administer IV antibiotics.  Pneumonia with bilateral infiltrates New  bilateral infiltrates on CT chest suggestive of pneumonia  versus hemoptysis. Recent bronchoscopy showed pseudomonas and mycobacterial infection. Completed course of ciprofloxacin for urinary tract infection, which should have addressed pseudomonas. Additional IV antibiotics planned to address new infiltrates. - Monitor response to antibiotics during hospital stay.   Signature:   Antaeus Karel MD [facility] See Amion for pager  If no response to pager , please call [phone] until 7pm After 7:00 pm call Elink  [phone] 04/07/2024, 12:06 PM

## 2024-04-07 NOTE — Consult Note (Addendum)
 [facility]    Date of Admission:  04/07/2024     Reason for Consult: pna/hemoptysis    Referring Provider: Zella, Mir     Lines:  peripheral  Abx: 9/12-c         Assessment: 68 yo female with bronchiectasis and left upper lobe cavitary process followed by onc, admitted 9/12 for an episode hemoptysis, now with repeat chest ct showing increased opacity around left upper lobe  9/2 recent bronch cx (both left upper lobe and right lobe) Few pseudomonas sensitive to cefepime /ceftazi, cipro, meropenem, piptazo, tobra Positive AFB culture left upper lobe, still cooking  Discussed with pulm dr Theophilus and it's a little far out to attribute the opacity to bronchoscopy   No sign of sepsis  question indolent process ?mac; no suspicion for tb. Pseudomonas colonization sometimes does contribute to bronchiectasis exacerbation  Would treat with a week as cap/bronchiectasis exacerbation   On a note, it appears she was taking cipro outpatient starting 9/6 for uti (this should have covered the pseudomonas, so I query if supportive care and making sure no active bleeding lesion that needs to be embolized would be sufficient. But again the increased opacity is not explained by this long out from the bronchosocpy procedure  Lots of diarrhea now and will test for cdiff  Plan: Appreciate pulm following; IR to be involved if ongoing hemoptysis Reasonable to treat for cap/bronchiectasis exacerbation with doxy/cefepime  for now Cdiff screen Augmentin /cipro oral transition would be fine too Duration 7 days Maintain standard isolation precaution Discussed with triad      ------------------------------------------------ Principal Problem:   Hemoptysis    HPI: Brandi Bates is a 68 y.o. female with bronchiectasis and left upper lobe cavitary process followed by onc, admitted 9/12 for an episode hemoptysis, now with repeat chest ct showing increased  opacity around left upper lobe  Has been followed by pulm Other problem gerd, breast cancer s/p bilateral mastectomy  Patient had increased cough the day of admission and an episode of hemoptysis The last few days more tired No fever, chill, n/v Diarrhea as described below with cipro  No chest pain  Afebrile on admission No leukocytosis Pulm evaluated Ct with ?lul increased consolidation and pulm recommends admission for more abx   Patient previous smoker 30 yr 2ppd Was seen in pulm clinic 02/2024 for 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 this lesion enlarge 01/2024 and referred to pulm  Chart mention hx radiation to chest  Per 9/2 pulm sx noted mild dyspna but no b sx otherwise and no cough  9/2 bronched and afb growing and few pseudomonas growing. 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  No tb risk factors  Appears she was given cipro 9/6 for uti, developed diarrhea and malaise, then the day of admission cough as above     Family History  Problem Relation Age of Onset   Cancer Mother        lung   Hypertension Mother    Cancer Father        lung   Cancer Brother        BRAIN CANCER   Hypertension Brother    Cancer Cousin         2 PATERNAL COUSINS - BREAST CA   Hypertension Sister    Hypertension Maternal Uncle    Hypertension Maternal Grandmother    Colon cancer Other 36  colon METS to liver    Social History   Tobacco Use   Smoking status: Former    Current packs/day: 0.00    Average packs/day: 2.0 packs/day for 28.0 years (56.0 ttl pk-yrs)    Types: Cigarettes    Start date: 05/31/1969    Quit date: 05/31/1997    Years since quitting: 26.8   Smokeless tobacco: Never  Vaping Use   Vaping status: Never Used  Substance Use Topics   Alcohol use: No   Drug use: No    Allergies  Allergen Reactions   Morphine  And Codeine Hives and Itching    All over the body   Omeprazole Magnesium  Nausea Only   Gabapentin Other (See  Comments)    Unable to sleep   Prilosec [Omeprazole] Nausea Only   Tamoxifen Citrate Other (See Comments)    Blood clot    Latex Rash    Only where touched    Review of Systems: ROS All Other ROS was negative, except mentioned above   Past Medical History:  Diagnosis Date   Allergy    Anxiety    Bladder cystocele 11/20/11   low-lying ct result   Breast cancer (HCC) 10/23/10   s/p L mastectomy, chemo/radiation, er/pr +, Her2 -   Breast cancer (HCC) 11/10/11   S/P right mastectomy   Breast wound    Left breast from radiation, skin graft   Chronic low back pain    everyday   Eczema    GERD (gastroesophageal reflux disease)    Hepatic steatosis 11/20/11   severe    History of cancer chemotherapy    memory issues from chemo   History of radiation therapy 04/23/11 thru 06/08/11   L breast   Lymphedema of arm    left   Migraines    Neuromuscular disorder (HCC)    raynauds syndrome    Numbness of feet    Pneumonia April 2013   PONV (postoperative nausea and vomiting)    Pulmonary thromboembolism (HCC) 11/20/11   ct positive acute w/i segmental branches of right lower lobe   Shortness of breath on exertion        Scheduled Meds:  atorvastatin   10 mg Oral Daily   lisinopril -hydrochlorothiazide   1 tablet Oral Daily   Continuous Infusions:  ceFEPime  (MAXIPIME ) IV     [START ON 04/08/2024] doxycycline  (VIBRAMYCIN ) IV     PRN Meds:.acetaminophen  **OR** acetaminophen , albuterol , ondansetron  **OR** ondansetron  (ZOFRAN ) IV, traZODone    OBJECTIVE: Blood pressure (!) 145/71, pulse 75, temperature 98.5 F (36.9 C), temperature source Oral, resp. rate 16, height 5' 4 (1.626 m), weight 79.4 kg, SpO2 96%.  Physical Exam General/constitutional: no distress, pleasant HEENT: Normocephalic, PER, Conj Clear, EOMI, Oropharynx clear Neck supple CV: rrr no mrg Lungs: cta; normal respiratory effort Abd: Soft, Nontender Ext: no edema Skin: No Rash Neuro: nonfocal MSK: no  peripheral joint swelling/tenderness/warmth; back spines nontender     Lab Results Lab Results  Component Value Date   WBC 9.4 04/07/2024   HGB 11.8 (L) 04/07/2024   HCT 38.0 04/07/2024   MCV 88.8 04/07/2024   PLT 302 04/07/2024    Lab Results  Component Value Date   CREATININE 0.69 04/07/2024   BUN 12 04/07/2024   NA 140 04/07/2024   K 3.6 04/07/2024   CL 104 04/07/2024   CO2 20 (L) 04/07/2024    Lab Results  Component Value Date   ALT 39 05/26/2018   AST 25 05/26/2018   ALKPHOS 107 05/26/2018  BILITOT 0.5 05/26/2018      Microbiology: Recent Results (from the past 240 hours)  Culture, BAL-quantitative w Gram Stain     Status: None   Collection Time: 03/28/24  2:23 PM   Specimen: Bronchial Alveolar Lavage; Respiratory  Result Value Ref Range Status   Specimen Description   Final    BRONCHIAL ALVEOLAR LAVAGE Performed at [facility], [address] [place], [place] [address]    Special Requests   Final    LEFT UPPER LOBE Performed at [facility], [address], [place], [place] [address]    Gram Stain   Final    RARE WBC PRESENT, PREDOMINANTLY MONONUCLEAR NO ORGANISMS SEEN    Culture   Final    NO GROWTH 2 DAYS Performed at [facility], [address] [place], [place] [address]    Report Status 03/31/2024 FINAL  Final  Culture, Respiratory w Gram Stain     Status: None   Collection Time: 03/28/24  2:23 PM   Specimen: Bronchial Alveolar Lavage; Respiratory  Result Value Ref Range Status   Specimen Description   Final    BRONCHIAL ALVEOLAR LAVAGE Performed at [facility], [address] [place], [place] [address]    Special Requests   Final    UPPER LEFT LOBE Performed at [facility], [address], [place], [place] [address]    Gram Stain   Final    RARE WBC PRESENT, PREDOMINANTLY PMN NO ORGANISMS SEEN    Culture   Final    RARE Normal respiratory  flora-no Staph aureus or Pseudomonas seen Performed at [facility], [address], [place], [place] [address]    Report Status 03/31/2024 FINAL  Final  Fungus Stain     Status: None   Collection Time: 03/28/24  2:23 PM   Specimen: Bronchial Alveolar Lavage; Respiratory  Result Value Ref Range Status   FUNGUS STAIN CANDUP  Final    Comment: (NOTE) Test not performed. Duplicate tests ordered. Notified Saqlla L. 03/31/2024 Performed At: [facility] [address] [place], [place] [id number] Jennette Shorter MD Ey:[id number]    Fungal Source BRONCHIAL ALVEOLAR LAVAGE  Final    Comment: Performed at [facility], [address] [place], [place] [address]  Fungus Culture With Stain     Status: None (Preliminary result)   Collection Time: 03/28/24  2:31 PM   Specimen: Bronchial Alveolar Lavage; Respiratory  Result Value Ref Range Status   Fungus Stain Final report  Final    Comment: (NOTE) Performed At: [facility] [address] [place], [place] [id number] Jennette Shorter MD Ey:[id number]    Fungus (Mycology) Culture PENDING  Incomplete   Fungal Source RIGHT MIDDLE LOBE BAL  Final    Comment: Performed at [facility], [address], [place], [place] [address]  Culture, BAL-quantitative w Gram Stain     Status: Abnormal   Collection Time: 03/28/24  2:31 PM   Specimen: Bronchial Alveolar Lavage; Respiratory  Result Value Ref Range Status   Specimen Description   Final    BRONCHIAL ALVEOLAR LAVAGE Performed at [facility], [address], [place], [place] [address]    Special Requests   Final    RIGHT MIDDLE LOBE BAL Performed at [facility], [address] [place], [place] [address]    Gram Stain NO WBC SEEN NO ORGANISMS SEEN   Final   Culture (  A)  Final    10,000 COLONIES/mL Normal respiratory flora-no Staph aureus or Pseudomonas seen Performed at [facility],  [address] [place], [place] [address]    Report Status 03/31/2024 FINAL  Final  Culture, Respiratory w Gram Stain     Status: None   Collection Time: 03/28/24  2:31 PM   Specimen: Bronchial Alveolar Lavage; Respiratory  Result Value Ref Range Status   Specimen Description   Final    BRONCHIAL ALVEOLAR LAVAGE Performed at [facility], [address], [place], [place] [address]    Special Requests   Final    RIGHT MIDDLE LOBE BAL Performed at [facility], [address] [place], [place] [address]    Gram Stain   Final    NO WBC SEEN NO ORGANISMS SEEN Performed at [facility], [address] [place], [place] [address]    Culture FEW PSEUDOMONAS AERUGINOSA  Final   Report Status 04/03/2024 FINAL  Final   Organism ID, Bacteria PSEUDOMONAS AERUGINOSA  Final      Susceptibility   Pseudomonas aeruginosa - MIC*    MEROPENEM <=0.25 SENSITIVE Sensitive     CIPROFLOXACIN <=0.06 SENSITIVE Sensitive     IMIPENEM 4 INTERMEDIATE Intermediate     PIP/TAZO Value in next row Sensitive ug/mL     <=4 SENSITIVEThis is a modified FDA-approved test that has been validated and its performance characteristics determined by the reporting laboratory.  This laboratory is certified under the Clinical Laboratory Improvement Amendments CLIA as qualified to perform high complexity clinical laboratory testing.    CEFEPIME  Value in next row Sensitive      <=4 SENSITIVEThis is a modified FDA-approved test that has been validated and its performance characteristics determined by the reporting laboratory.  This laboratory is certified under the Clinical Laboratory Improvement Amendments CLIA as qualified to perform high complexity clinical laboratory testing.    CEFTAZIDIME/AVIBACTAM Value in next row Sensitive ug/mL     <=4 SENSITIVEThis is a modified FDA-approved test that has been validated and its performance characteristics determined by the reporting laboratory.   This laboratory is certified under the Clinical Laboratory Improvement Amendments CLIA as qualified to perform high complexity clinical laboratory testing.    CEFTOLOZANE/TAZOBACTAM Value in next row Sensitive ug/mL     <=4 SENSITIVEThis is a modified FDA-approved test that has been validated and its performance characteristics determined by the reporting laboratory.  This laboratory is certified under the Clinical Laboratory Improvement Amendments CLIA as qualified to perform high complexity clinical laboratory testing.    TOBRAMYCIN Value in next row Sensitive      <=4 SENSITIVEThis is a modified FDA-approved test that has been validated and its performance characteristics determined by the reporting laboratory.  This laboratory is certified under the Clinical Laboratory Improvement Amendments CLIA as qualified to perform high complexity clinical laboratory testing.    CEFTAZIDIME Value in next row Sensitive      <=4 SENSITIVEThis is a modified FDA-approved test that has been validated and its performance characteristics determined by the reporting laboratory.  This laboratory is certified under the Clinical Laboratory Improvement Amendments CLIA as qualified to perform high complexity clinical laboratory testing.    * FEW PSEUDOMONAS AERUGINOSA  Aerobic/Anaerobic Culture w Gram Stain (surgical/deep wound)     Status: None   Collection Time: 03/28/24  2:31 PM   Specimen: Bronchial Alveolar Lavage; Respiratory  Result Value Ref Range Status   Specimen Description   Final  BRONCHIAL ALVEOLAR LAVAGE Performed at [facility], [address] [place], [place] [address]    Special Requests   Final    RIGHT MIDDLE LOBE BAL Performed at [facility], [address] [place], [place] [address]    Gram Stain   Final    FEW WBC PRESENT, PREDOMINANTLY PMN NO ORGANISMS SEEN    Culture   Final    RARE PSEUDOMONAS AERUGINOSA WITHIN MIXED FLORA NO ANAEROBES  ISOLATED Performed at [facility], [address], [place], [place] [address]    Report Status 04/02/2024 FINAL  Final   Organism ID, Bacteria PSEUDOMONAS AERUGINOSA  Final      Susceptibility   Pseudomonas aeruginosa - MIC*    MEROPENEM <=0.25 SENSITIVE Sensitive     CIPROFLOXACIN <=0.06 SENSITIVE Sensitive     IMIPENEM 2 SENSITIVE Sensitive     PIP/TAZO Value in next row Sensitive ug/mL     <=4 SENSITIVEThis is a modified FDA-approved test that has been validated and its performance characteristics determined by the reporting laboratory.  This laboratory is certified under the Clinical Laboratory Improvement Amendments CLIA as qualified to perform high complexity clinical laboratory testing.    CEFEPIME  Value in next row Sensitive      <=4 SENSITIVEThis is a modified FDA-approved test that has been validated and its performance characteristics determined by the reporting laboratory.  This laboratory is certified under the Clinical Laboratory Improvement Amendments CLIA as qualified to perform high complexity clinical laboratory testing.    CEFTAZIDIME/AVIBACTAM Value in next row Sensitive ug/mL     <=4 SENSITIVEThis is a modified FDA-approved test that has been validated and its performance characteristics determined by the reporting laboratory.  This laboratory is certified under the Clinical Laboratory Improvement Amendments CLIA as qualified to perform high complexity clinical laboratory testing.    CEFTOLOZANE/TAZOBACTAM Value in next row Sensitive ug/mL     <=4 SENSITIVEThis is a modified FDA-approved test that has been validated and its performance characteristics determined by the reporting laboratory.  This laboratory is certified under the Clinical Laboratory Improvement Amendments CLIA as qualified to perform high complexity clinical laboratory testing.    TOBRAMYCIN Value in next row Sensitive      <=4 SENSITIVEThis is a modified FDA-approved test that has been validated and  its performance characteristics determined by the reporting laboratory.  This laboratory is certified under the Clinical Laboratory Improvement Amendments CLIA as qualified to perform high complexity clinical laboratory testing.    CEFTAZIDIME Value in next row Sensitive      <=4 SENSITIVEThis is a modified FDA-approved test that has been validated and its performance characteristics determined by the reporting laboratory.  This laboratory is certified under the Clinical Laboratory Improvement Amendments CLIA as qualified to perform high complexity clinical laboratory testing.    * RARE PSEUDOMONAS AERUGINOSA  Acid Fast Culture with reflexed sensitivities     Status: Abnormal   Collection Time: 03/28/24  2:31 PM   Specimen: Bronchial Alveolar Lavage; Respiratory  Result Value Ref Range Status   Acid Fast Culture Positive (A)  Final    Comment: (NOTE) Acid-fast bacilli have been detected in culture at 1 week. Further identification to follow. Performed At: [facility] [address] [place], [place] [id number] Jennette Shorter MD Ey:[id number]    Source of Sample RIGHT MIDDLE LOBE BAL  Final    Comment: Performed at [facility], [address], [place], [place] [address]  Acid Fast Smear (AFB)  Status: None   Collection Time: 03/28/24  2:31 PM   Specimen: Bronchial Alveolar Lavage; Respiratory  Result Value Ref Range Status   AFB Specimen Processing Concentration  Final   Acid Fast Smear Negative  Final    Comment: (NOTE) Performed At: [facility] [address] [place], [place] [id number] Jennette Shorter MD Ey:[id number]    Source (AFB) RIGHT MIDDLE LOBE BAL  Final    Comment: Performed at [facility], [address], [place], [place] [address]  Fungus Culture Result     Status: None   Collection Time: 03/28/24  2:31 PM  Result Value Ref Range Status   Result 1 Comment  Final    Comment: (NOTE) KOH/Calcofluor preparation:   no fungus observed. Performed At: [facility] [address] [place], [place] [id number] Jennette Shorter MD Ey:[id number]   Acid Fast ID by PCR and Susceptibilities     Status: Abnormal   Collection Time: 03/28/24  2:31 PM  Result Value Ref Range Status   M Tuberculosis Complex Negative  Final    Comment: (NOTE) This test was developed and its performance characteristics determined by [facility]. It has not been cleared or approved by the Food and Drug Administration.    M avium complex Positive (A)  Final    Comment: (NOTE) This test was developed and its performance characteristics determined by [facility]. It has not been cleared or approved by the Food and Drug Administration.    Reflex ID by MALDI Not Indicated  Final   Other: NAIDN  Final    Comment: No additional identification testing is necessary.   Susceptibility Testing See reflex.  Final    Comment: (NOTE) Performed At: [facility] [address] [place], [place] [id number] Jennette Shorter MD Ey:[id number]   Fungus Culture With Stain     Status: None (Preliminary result)   Collection Time: 03/28/24  3:02 PM   Specimen: Bronchial Alveolar Lavage; Respiratory  Result Value Ref Range Status   Fungus Stain Final report  Final    Comment: (NOTE) Performed At: [facility] [address] [place], [place] [id number] Jennette Shorter MD Ey:[id number]    Fungus (Mycology) Culture PENDING  Incomplete   Fungal Source LEFT UPPER LOBE  Final    Comment: Performed at [facility], [address] [place], [place] [address]  Acid Fast Culture with reflexed sensitivities     Status: Abnormal   Collection Time: 03/28/24  3:02 PM   Specimen: Bronchial Alveolar Lavage; Respiratory  Result Value Ref Range Status   Acid Fast Culture Positive (A)  Final    Comment: (NOTE) Acid-fast bacilli have been detected in culture at 1 week. Further identification to follow. Performed At: [facility] [address] [place], [place] [id number] Jennette Shorter MD Ey:[id number]    Source of Sample LEFT UPPER LOBE  Final    Comment: Performed at [facility], [address], [place], [place] [address]  Acid Fast Smear (AFB)     Status: None   Collection Time: 03/28/24  3:02 PM   Specimen: Bronchial Alveolar Lavage; Respiratory  Result Value Ref Range Status   AFB Specimen Processing Concentration  Final   Acid Fast Smear Negative  Final    Comment: (NOTE) Performed At: [facility] [address] [place], [place] [id number] Jennette Shorter MD Ey:[id number]    Source (AFB) LEFT UPPER LOBE  Final    Comment: Performed at [facility], [address]  [place], [place] [address]  Fungus Culture Result     Status: None   Collection Time: 03/28/24  3:02 PM  Result Value Ref Range Status   Result 1 Comment  Final    Comment: (NOTE) KOH/Calcofluor preparation:  no fungus observed. Performed At: [facility] [address] [place], [place] [id number] Jennette Shorter MD Ey:[id number]      Serology:    Imaging: If present, new imagings (plain films, ct scans, and mri) have been personally visualized and interpreted; radiology reports have been reviewed. Decision making incorporated into the Impression / Recommendations.  9/12 chest cta 1. Negative for acute pulmonary embolus. And no obvious contrast extravasation into the lungs. 2. Chronic left upper lobe architectural distortion and cavitary lesion with extensive new surrounding and widespread bilateral pulmonary airspace disease with areas of early consolidation. No associated pleural effusion. Major airways remain patent. Broad differential considerations including bilateral alveolar hemorrhage, vasculitis, pneumonia, drug reaction or other non-infectious inflammation.  Constance ONEIDA Passer, MD [facility] [phone]  pager    04/07/2024, 1:00 PM

## 2024-04-07 NOTE — ED Notes (Signed)
 ED TO INPATIENT HANDOFF REPORT  ED Nurse Name and Phone #: Joaquim [phone]  S Name/Age/Gender Brandi Bates Baptist 68 y.o. female Room/Bed: WA15/WA15  Code Status   Code Status: Full Code  Home/SNF/Other Home Patient oriented to: self, place, time, and situation Is this baseline? Yes   Triage Complete: Triage complete  Chief Complaint Hemoptysis [R04.2]  Triage Note Patient had a scope to check out her lungs last Tuesday along with a lung biopsy. Had some type of infection on her lungs but was told it was no cancer. Began coughing up bright red blood today. Has a kidney infection that began last Thursday. Has no energy, feeling drained. No vomiting. Is currently taking Cipro antibiotic for kidney infection.    Allergies Allergies  Allergen Reactions   Morphine  And Codeine Hives and Itching    All over the body   Omeprazole Magnesium  Nausea Only   Gabapentin Other (See Comments)    Unable to sleep   Prilosec [Omeprazole] Nausea Only   Tamoxifen Citrate Other (See Comments)    Blood clot    Latex Rash    Only where touched    Level of Care/Admitting Diagnosis ED Disposition     ED Disposition  Admit   Condition  --   Comment  Hospital Area: [facility] [[id number]]  Level of Care: Telemetry [5]  Admit to tele based on following criteria: Other see comments  Comments: bleeding  May place patient in observation at [facility] or [facility] if equivalent level of care is available:: Yes  Covid Evaluation: Asymptomatic - no recent exposure (last 10 days) testing not required  Diagnosis: Hemoptysis [741752]  Admitting Physician: ZELLA KATHA HERO [[id number]]  Attending Physician: ZELLA, MIR Hart.Gula [[id number]]  For patients discharging to extended facilities (i.e. SNF, AL, group homes or LTAC) initiate:: Discharge to SNF/Facility Placement COVID-19 Lab Testing Protocol          B Medical/Surgery History Past Medical History:  Diagnosis Date   Allergy     Anxiety    Bladder cystocele 11/20/11   low-lying ct result   Breast cancer (HCC) 10/23/10   s/p L mastectomy, chemo/radiation, er/pr +, Her2 -   Breast cancer (HCC) 11/10/11   S/P right mastectomy   Breast wound    Left breast from radiation, skin graft   Chronic low back pain    everyday   Eczema    GERD (gastroesophageal reflux disease)    Hepatic steatosis 11/20/11   severe    History of cancer chemotherapy    memory issues from chemo   History of radiation therapy 04/23/11 thru 06/08/11   L breast   Lymphedema of arm    left   Migraines    Neuromuscular disorder (HCC)    raynauds syndrome    Numbness of feet    Pneumonia April 2013   PONV (postoperative nausea and vomiting)    Pulmonary thromboembolism (HCC) 11/20/11   ct positive acute w/i segmental branches of right lower lobe   Shortness of breath on exertion    Past Surgical History:  Procedure Laterality Date   ABDOMINAL HYSTERECTOMY     BILATERAL SALPINGOOPHORECTOMY  11/10/11   laparoscopy   BREAST SURGERY Bilateral    mastectomy   CHOLECYSTECTOMY  1999   DIAGNOSTIC LAPAROSCOPY     IRRIGATION AND DEBRIDEMENT ABSCESS  06/16/2011   Procedure: IRRIGATION AND DEBRIDEMENT ABSCESS;  Surgeon: Vicenta DELENA Poli, MD;  Location: [facility];  Service: General;  Laterality: Left;  incision and drainage of left chest wall abcess   MASTECTOMY  11/10/11   right; w/SNB   MASTECTOMY MODIFIED RADICAL  10/23/11   left   MASTECTOMY W/ SENTINEL NODE BIOPSY  11/10/2011   Procedure: MASTECTOMY WITH SENTINEL LYMPH NODE BIOPSY;  Surgeon: Vicenta DELENA Poli, MD;  Location: [facility];  Service: General;  Laterality: Right;   PORT-A-CATH REMOVAL Right 09/26/2014   Procedure: MINOR REMOVAL PORT-A-CATH;  Surgeon: Vicenta Poli, MD;  Location: [facility];  Service: General;  Laterality: Right;   PORTACATH PLACEMENT     right subclavian   TUBAL LIGATION  1979   VIDEO BRONCHOSCOPY Bilateral 03/28/2024   Procedure: BRONCHOSCOPY, WITH  FLUOROSCOPY;  Surgeon: Zaida Zola SAILOR, MD;  Location: [facility];  Service: Pulmonary;  Laterality: Bilateral;     A IV Location/Drains/Wounds Patient Lines/Drains/Airways Status     Active Line/Drains/Airways     Name Placement date Placement time Site Days   Peripheral IV 04/07/24 20 G Right Antecubital 04/07/24  0821  Antecubital  less than 1   Airway 8 mm 03/28/24  1414  -- 10   Incision - 3 Ports Abdomen 1: Umbilicus 2: Right;Lateral 3: Left;Lateral 11/10/11  1442  -- 4532            Intake/Output Last 24 hours  Intake/Output Summary (Last 24 hours) at 04/07/2024 1724 Last data filed at 04/07/2024 1023 Gross per 24 hour  Intake 1000 ml  Output --  Net 1000 ml    Labs/Imaging Results for orders placed or performed during the hospital encounter of 04/07/24 (from the past 48 hours)  Urinalysis, Routine w reflex microscopic -Urine, Clean Catch     Status: Abnormal   Collection Time: 04/07/24  8:04 AM  Result Value Ref Range   Color, Urine YELLOW YELLOW   APPearance HAZY (A) CLEAR   Specific Gravity, Urine 1.017 1.005 - 1.030   pH 5.0 5.0 - 8.0   Glucose, UA NEGATIVE NEGATIVE mg/dL   Hgb urine dipstick MODERATE (A) NEGATIVE   Bilirubin Urine NEGATIVE NEGATIVE   Ketones, ur NEGATIVE NEGATIVE mg/dL   Protein, ur NEGATIVE NEGATIVE mg/dL   Nitrite NEGATIVE NEGATIVE   Leukocytes,Ua SMALL (A) NEGATIVE   RBC / HPF 11-20 0 - 5 RBC/hpf   WBC, UA 0-5 0 - 5 WBC/hpf   Bacteria, UA RARE (A) NONE SEEN   Squamous Epithelial / HPF 0-5 0 - 5 /HPF   Mucus PRESENT     Comment: Performed at [facility], [address] [place], [place] [address]  CBC     Status: Abnormal   Collection Time: 04/07/24  8:22 AM  Result Value Ref Range   WBC 9.4 4.0 - 10.5 K/uL   RBC 4.28 3.87 - 5.11 MIL/uL   Hemoglobin 11.8 (L) 12.0 - 15.0 g/dL   HCT 61.9 63.9 - 53.9 %   MCV 88.8 80.0 - 100.0 fL   MCH 27.6 26.0 - 34.0 pg   MCHC 31.1 30.0 - 36.0 g/dL   RDW 86.1 88.4 - 84.4 %    Platelets 302 150 - 400 K/uL   nRBC 0.0 0.0 - 0.2 %    Comment: Performed at [facility], [address], [place], [place] [address]  Basic metabolic panel     Status: Abnormal   Collection Time: 04/07/24  8:22 AM  Result Value Ref Range   Sodium 140 135 - 145 mmol/L   Potassium 3.6 3.5 - 5.1 mmol/L   Chloride  104 98 - 111 mmol/L   CO2 20 (L) 22 - 32 mmol/L   Glucose, Bld 113 (H) 70 - 99 mg/dL    Comment: Glucose reference range applies only to samples taken after fasting for at least 8 hours.   BUN 12 8 - 23 mg/dL   Creatinine, Ser 9.30 0.44 - 1.00 mg/dL   Calcium  9.0 8.9 - 10.3 mg/dL   GFR, Estimated >39 >39 mL/min    Comment: (NOTE) Calculated using the CKD-EPI Creatinine Equation (2021)    Anion gap 16 (H) 5 - 15    Comment: Performed at [facility], [address] [place], [place] [address]   CT L-SPINE NO CHARGE Result Date: 04/07/2024 CLINICAL DATA:  68 year old female with recent lung biopsy, hemoptysis, suspected urinary infection, on antibiotics, fatigue, increasing headache. History of breast cancer. EXAM: CT LUMBAR SPINE WITH CONTRAST TECHNIQUE: Technique: Multiplanar CT images of the lumbar spine were reconstructed from contemporary CT of the Abdomen and Pelvis. RADIATION DOSE REDUCTION: This exam was performed according to the departmental dose-optimization program which includes automated exposure control, adjustment of the mA and/or kV according to patient size and/or use of iterative reconstruction technique. CONTRAST:  No additional COMPARISON:  CTA Chest,, CT abdomen, and Pelvis today reported separately. CT Abdomen and Pelvis 11/20/2011. FINDINGS: Segmentation: Transitional anatomy, partially sacralized L5 level. Partially this digital L5-S1 disc space. Correlation with radiographs is recommended prior to any operative intervention. Alignment: Stable lordosis since 2013. Minimal scoliosis. No spondylolisthesis. Vertebrae:  Maintained vertebral height. Bone mineralization appears stable since 2013. Lumbar vertebrae appear intact. Visible sacrum and SI joints intact. No acute or suspicious osseous lesion identified. Paraspinal and other soft tissues: Abdomen and pelvis reported separately. Negative lumbar paraspinal soft tissues. Disc levels: Mild for age lumbar spine degeneration appears stable since 2013. Capacious spinal canal. IMPRESSION: 1. Transitional anatomy with partially sacralized L5 level, otherwise normal for age CT appearance of the Lumbar Spine. 2. CTA Chest, CT Abdomen and Pelvis today reported separately. Electronically Signed   By: VEAR Hurst M.D.   On: 04/07/2024 10:29   CT ABDOMEN PELVIS W CONTRAST Result Date: 04/07/2024 CLINICAL DATA:  68 year old female with recent lung biopsy, hemoptysis, suspected urinary infection, on antibiotics, fatigue, increasing headache. History of breast cancer. EXAM: CT ABDOMEN AND PELVIS WITH CONTRAST TECHNIQUE: Multidetector CT imaging of the abdomen and pelvis was performed using the standard protocol following bolus administration of intravenous contrast. RADIATION DOSE REDUCTION: This exam was performed according to the departmental dose-optimization program which includes automated exposure control, adjustment of the mA and/or kV according to patient size and/or use of iterative reconstruction technique. CONTRAST:  OMNIPAQUE  IOHEXOL  350 MG/ML SOLN COMPARISON:  CTA chest and lumbar spine today reported separately. CT Abdomen and Pelvis 11/20/2011. FINDINGS: Lower chest: Stable to CTA reported separately today. Hepatobiliary: Chronic cholecystectomy. Hepatic steatosis is less apparent. No discrete liver lesion. Pancreas: Negative. Spleen: Stable at the upper limits of normal. Adrenals/Urinary Tract: Negative adrenal glands and kidneys. No delayed excretory images. Renal collecting systems and ureters are diminutive. Unremarkable urinary bladder. Chronic pelvic phleboliths.  Stomach/Bowel: Increased large bowel retained stool compared to 2013. Mild large bowel redundancy. Normal retrocecal appendix containing gas on series 4, image 68. Nondilated small bowel. Distal esophageal phrenic ampulla, less likely small hiatal hernia. Otherwise negative stomach and duodenum. No pneumoperitoneum, free fluid, mesenteric inflammation. Vascular/Lymphatic: Aortoiliac calcified atherosclerosis. Normal caliber abdominal aorta. Major arterial structures remain patent. Portal venous system is patent. No lymphadenopathy. Reproductive: Chronically absent  uterus, diminutive or absent ovaries. Other: No pelvis free fluid. Musculoskeletal: Lumbar spine reported separately today. Otherwise no acute or suspicious osseous lesion identified. IMPRESSION: 1. No acute or inflammatory process identified in the abdomen or pelvis. Aortic Atherosclerosis (ICD10-I70.0). 2. Abnormal lung bases, see Chest CTA today reported separately. 3. Lumbar spine reported separately today. Electronically Signed   By: VEAR Hurst M.D.   On: 04/07/2024 10:27   CT Angio Chest PE W/Cm &/Or Wo Cm Result Date: 04/07/2024 CLINICAL DATA:  68 year old female with recent lung biopsy, hemoptysis, suspected urinary infection, on antibiotics, fatigue, increasing headache. History of breast cancer. EXAM: CT ANGIOGRAPHY CHEST WITH CONTRAST TECHNIQUE: Multidetector CT imaging of the chest was performed using the standard protocol during bolus administration of intravenous contrast. Multiplanar CT image reconstructions and MIPs were obtained to evaluate the vascular anatomy. RADIATION DOSE REDUCTION: This exam was performed according to the departmental dose-optimization program which includes automated exposure control, adjustment of the mA and/or kV according to patient size and/or use of iterative reconstruction technique. CONTRAST:  OMNIPAQUE  IOHEXOL  350 MG/ML SOLN COMPARISON:  Chest CT without contrast 02/08/2024. FINDINGS: Cardiovascular:  Good contrast bolus timing in the pulmonary arterial tree. No pulmonary artery filling defect is identified. Calcified coronary artery atherosclerosis (series 11, image 170) redemonstrated. Heart size remains normal. No pericardial effusion. Negative thoracic aorta aside from mild atherosclerosis. Mediastinum/Nodes: Negative for mediastinal mass or lymphadenopathy. Lungs/Pleura: Lower lung volumes compared to July. Major airways remain patent. Chronic anterior left upper lobe architectural distortion and cavitary lesion (series 13, image 57) size and configuration of which not significantly changed from July, and adhesion to the overlying pleura appears stable. However, extensive new surrounding peribronchial and airspace opacity ranging from sub solid opacity to new surrounding consolidation. And widespread similar extensive peribronchial sub solid and solid opacity scattered in the right upper lobe, both lower lobes. Comparatively mild involvement in the right middle lobe. No areas of obvious contrast extravasation in the lungs. No pneumothorax or pleural effusion. Upper Abdomen: CT Abdomen and Pelvis reported separately. Musculoskeletal: Stable visualized osseous structures. No acute or suspicious osseous lesion identified. Bilateral mastectomy. Review of the MIP images confirms the above findings. IMPRESSION: 1. Negative for acute pulmonary embolus. And no obvious contrast extravasation into the lungs. 2. Chronic left upper lobe architectural distortion and cavitary lesion with extensive new surrounding and widespread bilateral pulmonary airspace disease with areas of early consolidation. No associated pleural effusion. Major airways remain patent. Broad differential considerations including bilateral alveolar hemorrhage, vasculitis, pneumonia, drug reaction or other non-infectious inflammation. 3. CT Abdomen and Pelvis reported separately. 4. Calcified coronary artery,  Aortic Atherosclerosis (ICD10-I70.0).  Electronically Signed   By: VEAR Hurst M.D.   On: 04/07/2024 10:23   CT Head Wo Contrast Result Date: 04/07/2024 CLINICAL DATA:  68 year old female with recent lung biopsy, hemoptysis, suspected urinary infection, on antibiotics, fatigue, increasing headache. EXAM: CT HEAD WITHOUT CONTRAST TECHNIQUE: Contiguous axial images were obtained from the base of the skull through the vertex without intravenous contrast. RADIATION DOSE REDUCTION: This exam was performed according to the departmental dose-optimization program which includes automated exposure control, adjustment of the mA and/or kV according to patient size and/or use of iterative reconstruction technique. COMPARISON:  None Available. FINDINGS: Brain: Cerebral volume is within normal limits for age. No midline shift, ventriculomegaly, mass effect, evidence of mass lesion, intracranial hemorrhage or evidence of cortically based acute infarction. Gray-white matter differentiation is within normal limits throughout the brain. Vascular: Calcified atherosclerosis at the skull base.  No suspicious intracranial vascular hyperdensity. Skull: Intact. Small and circumscribed left parietal bone lucency is likely benign on series 5, image 33. No acute osseous abnormality identified. Sinuses/Orbits: Visualized paranasal sinuses and mastoids are clear. Other: Negative orbit and scalp soft tissues, postoperative changes to both globes. IMPRESSION: Normal for age noncontrast Head CT. Electronically Signed   By: VEAR Hurst M.D.   On: 04/07/2024 10:15   DG Chest 2 View Result Date: 04/07/2024 EXAM: 2 VIEW(S) XRAY OF THE CHEST 04/07/2024 08:35:32 AM COMPARISON: 03/28/24. CLINICAL HISTORY: Hemoptysis. Pt is coughing up blood and feeling very weak. FINDINGS: LUNGS AND PLEURA: Redemonstrated left upper lobe cavitary lung mass. Interval development of diffuse interstitial opacities with multifocal airspace densities within the right lung. No pleural fluid or pneumothorax identified.  HEART AND MEDIASTINUM: No acute abnormality of the cardiac and mediastinal silhouettes. BONES AND SOFT TISSUES: Surgical clips noted in the left axilla. No acute osseous abnormality. IMPRESSION: 1. Interval development of diffuse interstitial opacities with multifocal airspace densities within the right lung. Differential considerations include diffuse pulmonary edema versus multifocal infection. 2. Redemonstrated left upper lobe cavitary lung mass. Electronically signed by: Waddell Calk MD 04/07/2024 08:43 AM EDT RP Workstation: [id number]    Pending Labs Unresulted Labs (From admission, onward)     Start     Ordered   04/08/24 0500  HIV Antibody (routine testing w rflx)  (HIV Antibody (Routine testing w reflex) panel)  Tomorrow morning,   R        04/07/24 1203   04/08/24 0500  Basic metabolic panel  Tomorrow morning,   R        04/07/24 1203   04/08/24 0500  CBC  Tomorrow morning,   R        04/07/24 1203   04/07/24 1359  C Difficile Quick Screen w PCR reflex  (C Difficile quick screen w PCR reflex panel )  Once, for 24 hours,   TIMED       References:    CDiff Information Tool   04/07/24 1358            Vitals/Pain Today's Vitals   04/07/24 0800 04/07/24 1100 04/07/24 1500 04/07/24 1545  BP:  (!) 145/71 120/65   Pulse:  75 81   Resp:  16 16   Temp:  98.5 F (36.9 C)  (!) 97.4 F (36.3 C)  TempSrc:  Oral  Oral  SpO2:  96% 93%   Weight:      Height:      PainSc: 0-No pain       Isolation Precautions Enteric precautions (UV disinfection)  Medications Medications  atorvastatin  (LIPITOR) tablet 10 mg (10 mg Oral Given 04/07/24 1334)  acetaminophen  (TYLENOL ) tablet 650 mg (has no administration in time range)    Or  acetaminophen  (TYLENOL ) suppository 650 mg (has no administration in time range)  traZODone  (DESYREL ) tablet 25 mg (has no administration in time range)  ondansetron  (ZOFRAN ) tablet 4 mg (has no administration in time range)    Or  ondansetron  (ZOFRAN )  injection 4 mg (has no administration in time range)  albuterol  (PROVENTIL ) (2.5 MG/3ML) 0.083% nebulizer solution 2.5 mg (has no administration in time range)  ceFEPIme  (MAXIPIME ) 2 g in sodium chloride  0.9 % 100 mL IVPB (2 g Intravenous New Bag/Given 04/07/24 1333)  doxycycline  (VIBRAMYCIN ) 100 mg in sodium chloride  0.9 % 250 mL IVPB (has no administration in time range)  lisinopril  (ZESTRIL ) tablet 20 mg (20 mg Oral Given 04/07/24 1334)  And  hydrochlorothiazide  (HYDRODIURIL ) tablet 12.5 mg (12.5 mg Oral Given 04/07/24 1334)  sodium chloride  0.9 % bolus 1,000 mL (0 mLs Intravenous Stopped 04/07/24 1023)  metoCLOPramide  (REGLAN ) injection 10 mg (10 mg Intravenous Given 04/07/24 0848)  diphenhydrAMINE  (BENADRYL ) injection 12.5 mg (12.5 mg Intravenous Given 04/07/24 0848)  iohexol  (OMNIPAQUE ) 350 MG/ML injection 100 mL (100 mLs Intravenous Contrast Given 04/07/24 0952)  cefTRIAXone  (ROCEPHIN ) 1 g in sodium chloride  0.9 % 100 mL IVPB (1 g Intravenous New Bag/Given 04/07/24 1120)    Mobility walks     Focused Assessments    R Recommendations: See Admitting Provider Note  Report given to:   Additional Notes:

## 2024-04-07 NOTE — H&P (Addendum)
 History and Physical  Brandi Bates FMW:[id number] DOB: [birth year] DOA: 04/07/2024  PCP: Patient, No Pcp Per   Chief Complaint: Coughing up blood  HPI: Brandi Bates is a 68 y.o. female with medical history significant for GERD, breast cancer status post bilateral mastectomy, anxiety, recent bronchoscopy with BAL and biopsy due to left-sided cavitary lung lesion now being admitted to the hospital with complaints of hemoptysis.  Patient states that she tolerated her biopsy well, after the biopsy she had some intermittent coughing spells with normal appearing sputum, with some spots of blood.  However today she started coughing up bright red blood, probably about a tablespoon at a time.  For the last few days, she has also been feeling a little drained, little energy, she denies any fevers, chills, nausea, vomiting diarrhea or other concerns.  Denies any chest pain.  She has been seen in consultation by pulmonology in the ER this morning, they recommend hospitalist observation.  Review of Systems: Please see HPI for pertinent positives and negatives. A complete 10 system review of systems are otherwise negative.  Past Medical History:  Diagnosis Date   Allergy    Anxiety    Bladder cystocele 11/20/11   low-lying ct result   Breast cancer (HCC) 10/23/10   s/p L mastectomy, chemo/radiation, er/pr +, Her2 -   Breast cancer (HCC) 11/10/11   S/P right mastectomy   Breast wound    Left breast from radiation, skin graft   Chronic low back pain    everyday   Eczema    GERD (gastroesophageal reflux disease)    Hepatic steatosis 11/20/11   severe    History of cancer chemotherapy    memory issues from chemo   History of radiation therapy 04/23/11 thru 06/08/11   L breast   Lymphedema of arm    left   Migraines    Neuromuscular disorder (HCC)    raynauds syndrome    Numbness of feet    Pneumonia April 2013   PONV (postoperative nausea and vomiting)    Pulmonary thromboembolism (HCC) 11/20/11    ct positive acute w/i segmental branches of right lower lobe   Shortness of breath on exertion    Past Surgical History:  Procedure Laterality Date   ABDOMINAL HYSTERECTOMY     BILATERAL SALPINGOOPHORECTOMY  11/10/11   laparoscopy   BREAST SURGERY Bilateral    mastectomy   CHOLECYSTECTOMY  1999   DIAGNOSTIC LAPAROSCOPY     IRRIGATION AND DEBRIDEMENT ABSCESS  06/16/2011   Procedure: IRRIGATION AND DEBRIDEMENT ABSCESS;  Surgeon: Vicenta DELENA Poli, MD;  Location: [facility];  Service: General;  Laterality: Left;  incision and drainage of left chest wall abcess   MASTECTOMY  11/10/11   right; w/SNB   MASTECTOMY MODIFIED RADICAL  10/23/11   left   MASTECTOMY W/ SENTINEL NODE BIOPSY  11/10/2011   Procedure: MASTECTOMY WITH SENTINEL LYMPH NODE BIOPSY;  Surgeon: Vicenta DELENA Poli, MD;  Location: [facility];  Service: General;  Laterality: Right;   PORT-A-CATH REMOVAL Right 09/26/2014   Procedure: MINOR REMOVAL PORT-A-CATH;  Surgeon: Vicenta Poli, MD;  Location: [facility];  Service: General;  Laterality: Right;   PORTACATH PLACEMENT     right subclavian   TUBAL LIGATION  1979   VIDEO BRONCHOSCOPY Bilateral 03/28/2024   Procedure: BRONCHOSCOPY, WITH FLUOROSCOPY;  Surgeon: Zaida Zola SAILOR, MD;  Location: [facility];  Service: Pulmonary;  Laterality: Bilateral;   Social History:  reports that she quit smoking about 26 years  ago. Her smoking use included cigarettes. She started smoking about 54 years ago. She has a 56 pack-year smoking history. She has never used smokeless tobacco. She reports that she does not drink alcohol and does not use drugs.  Allergies  Allergen Reactions   Morphine  And Codeine Hives and Itching    All over the body   Omeprazole Magnesium  Nausea Only   Gabapentin Other (See Comments)    Unable to sleep   Prilosec [Omeprazole] Nausea Only   Latex Rash    Only where touched    Family History  Problem Relation Age of Onset   Cancer Mother        lung    Hypertension Mother    Cancer Father        lung   Cancer Brother        BRAIN CANCER   Hypertension Brother    Cancer Cousin         2 PATERNAL COUSINS - BREAST CA   Hypertension Sister    Hypertension Maternal Uncle    Hypertension Maternal Grandmother    Colon cancer Other 43       colon METS to liver     Prior to Admission medications   Medication Sig Start Date End Date Taking? Authorizing Provider  alendronate (FOSAMAX) 70 MG tablet SMARTSIG:1 Tablet(s) By Mouth 02/11/24   [provider]  Aspirin-Acetaminophen -Caffeine (GOODY HEADACHE PO) Take by mouth.    [provider]  atorvastatin  (LIPITOR) 10 MG tablet Take 10 mg by mouth daily. 11/16/23   [provider]  calcium  carbonate (TUMS - DOSED IN MG ELEMENTAL CALCIUM ) 500 MG chewable tablet Chew 1 tablet by mouth as needed. Reported on 08/06/2015    [provider]  cholecalciferol (VITAMIN D3) 25 MCG (1000 UT) tablet Take 1 tablet (1,000 Units total) by mouth daily. 06/02/18   Magrinat, Sandria BROCKS, MD  famotidine  (PEPCID ) 20 MG tablet Take 20 mg by mouth daily. Patient taking differently: Take 20 mg by mouth 2 (two) times daily.    [provider]  lisinopril -hydrochlorothiazide  (ZESTORETIC ) 20-12.5 MG tablet Take 1 tablet by mouth daily.    [provider]  LORazepam  (ATIVAN ) 0.5 MG tablet Take 1 tablet (0.5 mg total) 2 (two) times daily as needed by mouth for anxiety. Patient not taking: No sig reported 06/03/17   Magrinat, Sandria BROCKS, MD  Potassium 99 MG TABS Take daily by mouth.    [provider]  vitamin C (ASCORBIC ACID) 500 MG tablet Take 500 mg by mouth daily.    [provider]  gabapentin (NEURONTIN) 300 MG capsule daily. 09/17/11 10/02/11  [provider]    Physical Exam: BP (!) 145/71 (BP Location: Right Arm)   Pulse 75   Temp 98.5 F (36.9 C) (Oral)   Resp 16   Ht 5' 4 (1.626 m)   Wt 79.4 kg   SpO2 96%   BMI 30.04 kg/m  General:   Alert, oriented, calm, in no acute distress, resting comfortably on room air.  No current cough. Cardiovascular: RRR, no murmurs or rubs, no peripheral edema  Respiratory: clear to auscultation bilaterally, no wheezes, no crackles  Abdomen: soft, nontender, nondistended, normal bowel tones heard  Skin: dry, no rashes  Musculoskeletal: no joint effusions, normal range of motion  Psychiatric: appropriate affect, normal speech  Neurologic: extraocular muscles intact, clear speech, moving all extremities with intact sensorium         Labs on Admission:  Basic Metabolic  Panel: Recent Labs  Lab 04/07/24 0822  NA 140  K 3.6  CL 104  CO2 20*  GLUCOSE 113*  BUN 12  CREATININE 0.69  CALCIUM  9.0   Liver Function Tests: No results for input(s): AST, ALT, ALKPHOS, BILITOT, PROT, ALBUMIN in the last 168 hours. No results for input(s): LIPASE, AMYLASE in the last 168 hours. No results for input(s): AMMONIA in the last 168 hours. CBC: Recent Labs  Lab 04/07/24 0822  WBC 9.4  HGB 11.8*  HCT 38.0  MCV 88.8  PLT 302   Cardiac Enzymes: No results for input(s): CKTOTAL, CKMB, CKMBINDEX, TROPONINI in the last 168 hours. BNP (last 3 results) No results for input(s): BNP in the last 8760 hours.  ProBNP (last 3 results) No results for input(s): PROBNP in the last 8760 hours.  CBG: No results for input(s): GLUCAP in the last 168 hours.  Radiological Exams on Admission: CT L-SPINE NO CHARGE Result Date: 04/07/2024 CLINICAL DATA:  68 year old female with recent lung biopsy, hemoptysis, suspected urinary infection, on antibiotics, fatigue, increasing headache. History of breast cancer. EXAM: CT LUMBAR SPINE WITH CONTRAST TECHNIQUE: Technique: Multiplanar CT images of the lumbar spine were reconstructed from contemporary CT of the Abdomen and Pelvis. RADIATION DOSE REDUCTION: This exam was performed according to the departmental dose-optimization program which  includes automated exposure control, adjustment of the mA and/or kV according to patient size and/or use of iterative reconstruction technique. CONTRAST:  No additional COMPARISON:  CTA Chest,, CT abdomen, and Pelvis today reported separately. CT Abdomen and Pelvis 11/20/2011. FINDINGS: Segmentation: Transitional anatomy, partially sacralized L5 level. Partially this digital L5-S1 disc space. Correlation with radiographs is recommended prior to any operative intervention. Alignment: Stable lordosis since 2013. Minimal scoliosis. No spondylolisthesis. Vertebrae: Maintained vertebral height. Bone mineralization appears stable since 2013. Lumbar vertebrae appear intact. Visible sacrum and SI joints intact. No acute or suspicious osseous lesion identified. Paraspinal and other soft tissues: Abdomen and pelvis reported separately. Negative lumbar paraspinal soft tissues. Disc levels: Mild for age lumbar spine degeneration appears stable since 2013. Capacious spinal canal. IMPRESSION: 1. Transitional anatomy with partially sacralized L5 level, otherwise normal for age CT appearance of the Lumbar Spine. 2. CTA Chest, CT Abdomen and Pelvis today reported separately. Electronically Signed   By: VEAR Hurst M.D.   On: 04/07/2024 10:29   CT ABDOMEN PELVIS W CONTRAST Result Date: 04/07/2024 CLINICAL DATA:  68 year old female with recent lung biopsy, hemoptysis, suspected urinary infection, on antibiotics, fatigue, increasing headache. History of breast cancer. EXAM: CT ABDOMEN AND PELVIS WITH CONTRAST TECHNIQUE: Multidetector CT imaging of the abdomen and pelvis was performed using the standard protocol following bolus administration of intravenous contrast. RADIATION DOSE REDUCTION: This exam was performed according to the departmental dose-optimization program which includes automated exposure control, adjustment of the mA and/or kV according to patient size and/or use of iterative reconstruction technique. CONTRAST:   OMNIPAQUE  IOHEXOL  350 MG/ML SOLN COMPARISON:  CTA chest and lumbar spine today reported separately. CT Abdomen and Pelvis 11/20/2011. FINDINGS: Lower chest: Stable to CTA reported separately today. Hepatobiliary: Chronic cholecystectomy. Hepatic steatosis is less apparent. No discrete liver lesion. Pancreas: Negative. Spleen: Stable at the upper limits of normal. Adrenals/Urinary Tract: Negative adrenal glands and kidneys. No delayed excretory images. Renal collecting systems and ureters are diminutive. Unremarkable urinary bladder. Chronic pelvic phleboliths. Stomach/Bowel: Increased large bowel retained stool compared to 2013. Mild large bowel redundancy. Normal retrocecal appendix containing gas on series 4, image 68. Nondilated small bowel. Distal esophageal  phrenic ampulla, less likely small hiatal hernia. Otherwise negative stomach and duodenum. No pneumoperitoneum, free fluid, mesenteric inflammation. Vascular/Lymphatic: Aortoiliac calcified atherosclerosis. Normal caliber abdominal aorta. Major arterial structures remain patent. Portal venous system is patent. No lymphadenopathy. Reproductive: Chronically absent uterus, diminutive or absent ovaries. Other: No pelvis free fluid. Musculoskeletal: Lumbar spine reported separately today. Otherwise no acute or suspicious osseous lesion identified. IMPRESSION: 1. No acute or inflammatory process identified in the abdomen or pelvis. Aortic Atherosclerosis (ICD10-I70.0). 2. Abnormal lung bases, see Chest CTA today reported separately. 3. Lumbar spine reported separately today. Electronically Signed   By: VEAR Hurst M.D.   On: 04/07/2024 10:27   CT Angio Chest PE W/Cm &/Or Wo Cm Result Date: 04/07/2024 CLINICAL DATA:  68 year old female with recent lung biopsy, hemoptysis, suspected urinary infection, on antibiotics, fatigue, increasing headache. History of breast cancer. EXAM: CT ANGIOGRAPHY CHEST WITH CONTRAST TECHNIQUE: Multidetector CT imaging of the chest was  performed using the standard protocol during bolus administration of intravenous contrast. Multiplanar CT image reconstructions and MIPs were obtained to evaluate the vascular anatomy. RADIATION DOSE REDUCTION: This exam was performed according to the departmental dose-optimization program which includes automated exposure control, adjustment of the mA and/or kV according to patient size and/or use of iterative reconstruction technique. CONTRAST:  OMNIPAQUE  IOHEXOL  350 MG/ML SOLN COMPARISON:  Chest CT without contrast 02/08/2024. FINDINGS: Cardiovascular: Good contrast bolus timing in the pulmonary arterial tree. No pulmonary artery filling defect is identified. Calcified coronary artery atherosclerosis (series 11, image 170) redemonstrated. Heart size remains normal. No pericardial effusion. Negative thoracic aorta aside from mild atherosclerosis. Mediastinum/Nodes: Negative for mediastinal mass or lymphadenopathy. Lungs/Pleura: Lower lung volumes compared to July. Major airways remain patent. Chronic anterior left upper lobe architectural distortion and cavitary lesion (series 13, image 57) size and configuration of which not significantly changed from July, and adhesion to the overlying pleura appears stable. However, extensive new surrounding peribronchial and airspace opacity ranging from sub solid opacity to new surrounding consolidation. And widespread similar extensive peribronchial sub solid and solid opacity scattered in the right upper lobe, both lower lobes. Comparatively mild involvement in the right middle lobe. No areas of obvious contrast extravasation in the lungs. No pneumothorax or pleural effusion. Upper Abdomen: CT Abdomen and Pelvis reported separately. Musculoskeletal: Stable visualized osseous structures. No acute or suspicious osseous lesion identified. Bilateral mastectomy. Review of the MIP images confirms the above findings. IMPRESSION: 1. Negative for acute pulmonary embolus. And  no obvious contrast extravasation into the lungs. 2. Chronic left upper lobe architectural distortion and cavitary lesion with extensive new surrounding and widespread bilateral pulmonary airspace disease with areas of early consolidation. No associated pleural effusion. Major airways remain patent. Broad differential considerations including bilateral alveolar hemorrhage, vasculitis, pneumonia, drug reaction or other non-infectious inflammation. 3. CT Abdomen and Pelvis reported separately. 4. Calcified coronary artery,  Aortic Atherosclerosis (ICD10-I70.0). Electronically Signed   By: VEAR Hurst M.D.   On: 04/07/2024 10:23   CT Head Wo Contrast Result Date: 04/07/2024 CLINICAL DATA:  68 year old female with recent lung biopsy, hemoptysis, suspected urinary infection, on antibiotics, fatigue, increasing headache. EXAM: CT HEAD WITHOUT CONTRAST TECHNIQUE: Contiguous axial images were obtained from the base of the skull through the vertex without intravenous contrast. RADIATION DOSE REDUCTION: This exam was performed according to the departmental dose-optimization program which includes automated exposure control, adjustment of the mA and/or kV according to patient size and/or use of iterative reconstruction technique. COMPARISON:  None Available. FINDINGS: Brain: Cerebral volume is  within normal limits for age. No midline shift, ventriculomegaly, mass effect, evidence of mass lesion, intracranial hemorrhage or evidence of cortically based acute infarction. Gray-white matter differentiation is within normal limits throughout the brain. Vascular: Calcified atherosclerosis at the skull base. No suspicious intracranial vascular hyperdensity. Skull: Intact. Small and circumscribed left parietal bone lucency is likely benign on series 5, image 33. No acute osseous abnormality identified. Sinuses/Orbits: Visualized paranasal sinuses and mastoids are clear. Other: Negative orbit and scalp soft tissues, postoperative changes  to both globes. IMPRESSION: Normal for age noncontrast Head CT. Electronically Signed   By: VEAR Hurst M.D.   On: 04/07/2024 10:15   DG Chest 2 View Result Date: 04/07/2024 EXAM: 2 VIEW(S) XRAY OF THE CHEST 04/07/2024 08:35:32 AM COMPARISON: 03/28/24. CLINICAL HISTORY: Hemoptysis. Pt is coughing up blood and feeling very weak. FINDINGS: LUNGS AND PLEURA: Redemonstrated left upper lobe cavitary lung mass. Interval development of diffuse interstitial opacities with multifocal airspace densities within the right lung. No pleural fluid or pneumothorax identified. HEART AND MEDIASTINUM: No acute abnormality of the cardiac and mediastinal silhouettes. BONES AND SOFT TISSUES: Surgical clips noted in the left axilla. No acute osseous abnormality. IMPRESSION: 1. Interval development of diffuse interstitial opacities with multifocal airspace densities within the right lung. Differential considerations include diffuse pulmonary edema versus multifocal infection. 2. Redemonstrated left upper lobe cavitary lung mass. Electronically signed by: Waddell Calk MD 04/07/2024 08:43 AM EDT RP Workstation: [id number]   Assessment/Plan Brandi Bates is a 68 y.o. female with medical history significant for GERD, breast cancer status post bilateral mastectomy, anxiety, recent bronchoscopy with BAL and biopsy due to left-sided cavitary lung lesion now being admitted to the hospital with complaints of hemoptysis.   Hemoptysis-most likely related to recent lung biopsy, though it has been 10 days.  Some bleeding was noted at biopsy site, but hemostasis was achieved with cold saline and epinephrine .  Patient is hemodynamically stable, he is not on any blood thinners. -Observation admission -Continue to avoid blood thinners -Discussed with Dr. Theophilus in person, can likely discharge home if stable for 24 hours, otherwise may require repeat bronchoscopy  MAC and Pseudomonas infection -IV azithromycin  and IV cefepime  -Discussed with Dr.  Overton, ID consult requested  Hyperlipidemia-Lipitor  Hypertension-continue home Zestoretic   DVT prophylaxis: SCDs only due to active bleed    Code Status: Full Code  Consults called: Pulmonology, ID  Admission status: Observation  Time spent: 50 minutes  Tynell Winchell CHRISTELLA Gail MD [facility] Pager [phone]  If 7PM-7AM, please contact night-coverage [email or web address] Password [email or web address]  04/07/2024, 12:03 PM

## 2024-04-07 NOTE — ED Notes (Signed)
 ED Provider at bedside.

## 2024-04-08 DIAGNOSIS — Z91040 Latex allergy status: Secondary | ICD-10-CM | POA: Diagnosis not present

## 2024-04-08 DIAGNOSIS — Z7983 Long term (current) use of bisphosphonates: Secondary | ICD-10-CM | POA: Diagnosis not present

## 2024-04-08 DIAGNOSIS — J151 Pneumonia due to Pseudomonas: Secondary | ICD-10-CM | POA: Diagnosis present

## 2024-04-08 DIAGNOSIS — Z7982 Long term (current) use of aspirin: Secondary | ICD-10-CM | POA: Diagnosis not present

## 2024-04-08 DIAGNOSIS — Z8249 Family history of ischemic heart disease and other diseases of the circulatory system: Secondary | ICD-10-CM | POA: Diagnosis not present

## 2024-04-08 DIAGNOSIS — R042 Hemoptysis: Secondary | ICD-10-CM | POA: Diagnosis not present

## 2024-04-08 DIAGNOSIS — Z86711 Personal history of pulmonary embolism: Secondary | ICD-10-CM | POA: Diagnosis not present

## 2024-04-08 DIAGNOSIS — Z923 Personal history of irradiation: Secondary | ICD-10-CM | POA: Diagnosis not present

## 2024-04-08 DIAGNOSIS — E785 Hyperlipidemia, unspecified: Secondary | ICD-10-CM | POA: Diagnosis present

## 2024-04-08 DIAGNOSIS — J471 Bronchiectasis with (acute) exacerbation: Secondary | ICD-10-CM | POA: Diagnosis present

## 2024-04-08 DIAGNOSIS — Z1152 Encounter for screening for COVID-19: Secondary | ICD-10-CM | POA: Diagnosis not present

## 2024-04-08 DIAGNOSIS — R197 Diarrhea, unspecified: Secondary | ICD-10-CM | POA: Diagnosis not present

## 2024-04-08 DIAGNOSIS — K219 Gastro-esophageal reflux disease without esophagitis: Secondary | ICD-10-CM | POA: Diagnosis present

## 2024-04-08 DIAGNOSIS — Z9221 Personal history of antineoplastic chemotherapy: Secondary | ICD-10-CM | POA: Diagnosis not present

## 2024-04-08 DIAGNOSIS — Z885 Allergy status to narcotic agent status: Secondary | ICD-10-CM | POA: Diagnosis not present

## 2024-04-08 DIAGNOSIS — Z87891 Personal history of nicotine dependence: Secondary | ICD-10-CM | POA: Diagnosis not present

## 2024-04-08 DIAGNOSIS — A319 Mycobacterial infection, unspecified: Secondary | ICD-10-CM | POA: Diagnosis present

## 2024-04-08 DIAGNOSIS — I7300 Raynaud's syndrome without gangrene: Secondary | ICD-10-CM | POA: Diagnosis present

## 2024-04-08 DIAGNOSIS — B965 Pseudomonas (aeruginosa) (mallei) (pseudomallei) as the cause of diseases classified elsewhere: Secondary | ICD-10-CM | POA: Diagnosis present

## 2024-04-08 DIAGNOSIS — J470 Bronchiectasis with acute lower respiratory infection: Secondary | ICD-10-CM | POA: Diagnosis present

## 2024-04-08 DIAGNOSIS — J189 Pneumonia, unspecified organism: Secondary | ICD-10-CM

## 2024-04-08 DIAGNOSIS — E876 Hypokalemia: Secondary | ICD-10-CM | POA: Diagnosis not present

## 2024-04-08 DIAGNOSIS — I10 Essential (primary) hypertension: Secondary | ICD-10-CM | POA: Diagnosis present

## 2024-04-08 DIAGNOSIS — N390 Urinary tract infection, site not specified: Secondary | ICD-10-CM | POA: Diagnosis present

## 2024-04-08 DIAGNOSIS — Z9013 Acquired absence of bilateral breasts and nipples: Secondary | ICD-10-CM | POA: Diagnosis not present

## 2024-04-08 LAB — CBC
HCT: 36.7 % (ref 36.0–46.0)
Hemoglobin: 11.4 g/dL — ABNORMAL LOW (ref 12.0–15.0)
MCH: 27.6 pg (ref 26.0–34.0)
MCHC: 31.1 g/dL (ref 30.0–36.0)
MCV: 88.9 fL (ref 80.0–100.0)
Platelets: 297 K/uL (ref 150–400)
RBC: 4.13 MIL/uL (ref 3.87–5.11)
RDW: 14 % (ref 11.5–15.5)
WBC: 8.7 K/uL (ref 4.0–10.5)
nRBC: 0 % (ref 0.0–0.2)

## 2024-04-08 LAB — RESPIRATORY PANEL BY PCR

## 2024-04-08 LAB — BASIC METABOLIC PANEL WITH GFR
Anion gap: 14 (ref 5–15)
BUN: 11 mg/dL (ref 8–23)
CO2: 22 mmol/L (ref 22–32)
Calcium: 8.5 mg/dL — ABNORMAL LOW (ref 8.9–10.3)
Chloride: 106 mmol/L (ref 98–111)
Creatinine, Ser: 0.62 mg/dL (ref 0.44–1.00)
GFR, Estimated: >60 mL/min (ref >60)
Glucose, Bld: 104 mg/dL — ABNORMAL HIGH (ref 70–99)
Potassium: 3.4 mmol/L — ABNORMAL LOW (ref 3.5–5.1)
Sodium: 142 mmol/L (ref 135–145)

## 2024-04-08 LAB — HIV ANTIBODY (ROUTINE TESTING W REFLEX): HIV Screen 4th Generation wRfx: NONREACTIVE

## 2024-04-08 LAB — SARS CORONAVIRUS 2 BY RT PCR: SARS Coronavirus 2 by RT PCR: NEGATIVE

## 2024-04-08 MED ORDER — POTASSIUM CHLORIDE CRYS ER 20 MEQ PO TBCR
40.0000 meq | EXTENDED_RELEASE_TABLET | Freq: Three times a day (TID) | ORAL | Status: AC
  Administered 2024-04-08 (×2): 40 meq via ORAL
  Filled 2024-04-08 (×2): qty 2

## 2024-04-08 NOTE — Plan of Care (Signed)

## 2024-04-08 NOTE — Care Management Obs Status (Signed)
 MEDICARE OBSERVATION STATUS NOTIFICATION   Patient Details  Name: Brandi Bates MRN: [id number] Date of Birth: 02/15/1956   Medicare Observation Status Notification Given:  Yes    Sonda Manuella Quill, RN 04/08/2024, 1:15 PM

## 2024-04-08 NOTE — Progress Notes (Signed)
 NAME:  Brandi Bates, MRN:  [id number], DOB:  01/11/1956, LOS: 0 ADMISSION DATE:  04/07/2024, CONSULTATION DATE: 04/07/2024 REFERRING MD: EDP, CHIEF COMPLAINT: Hemoptysis  History of Present Illness:  The patient, with bronchiectasis, cavitary lung lesion presents with hemoptysis.  Hemoptysis - Hemoptysis occurred early this morning during a coughing episode lasting approximately fifteen minutes - Blood was noticed after the episode, as it was dark at the time of coughing - Single episode of hemoptysis with no recurrence since this morning  Cavitary left lingula lesion, bronchiectasis and airway infection - Bronchoscopy performed on Tuesday, March 28, 2024 - Cultures are growing pseudomonas infection and positive AFB smear.  Pathology showed inflammation, granulomas and no evidence of malignancy - Initial post-procedure course was unremarkable  Urinary tract infection symptoms - Severe dysuria developed on Thursday following bronchoscopy, described as 'felt like razors coming out' when urinating - Difficulty voiding urine - Evaluated at urgent care at [facility] urgent care in [place] - Diagnosed with severe kidney infection - Completed a course of ciprofloxacin today - Significant improvement in urinary symptoms since starting antibiotics  Recent trauma - Sustained a fall on Friday night  Pertinent  Medical History    has a past medical history of Allergy, Anxiety, Bladder cystocele (11/20/11), Breast cancer (HCC) (10/23/10), Breast cancer (HCC) (11/10/11), Breast wound, Chronic low back pain, Eczema, GERD (gastroesophageal reflux disease), Hepatic steatosis (11/20/11), History of cancer chemotherapy, History of radiation therapy (04/23/11 thru 06/08/11), Lymphedema of arm, Migraines, Neuromuscular disorder (HCC), Numbness of feet, Pneumonia (April 2013), PONV (postoperative nausea and vomiting), Pulmonary thromboembolism (HCC) (11/20/11), and Shortness of breath on exertion.    Significant Hospital Events: Including procedures, antibiotic start and stop dates in addition to other pertinent events   9/12 Admit for hemoptysis, pneumonia  Interim History / Subjective:   No more episodes of hemoptysis  Objective    Blood pressure 132/72, pulse 77, temperature 99.5 F (37.5 C), temperature source Oral, resp. rate 18, height 5' 4 (1.626 m), weight 79.4 kg, SpO2 96%.        Intake/Output Summary (Last 24 hours) at 04/08/2024 0950 Last data filed at 04/07/2024 1800 Gross per 24 hour  Intake 2913 ml  Output --  Net 2913 ml   Filed Weights   04/07/24 0756  Weight: 79.4 kg    Examination: Gen:      No acute distress HEENT:  EOMI, sclera anicteric Neck:     No masses; no thyromegaly Lungs:    Clear to auscultation bilaterally; normal respiratory effort CV:         Regular rate and rhythm; no murmurs Abd:      + bowel sounds; soft, non-tender; no palpable masses, no distension Ext:    No edema; adequate peripheral perfusion Neuro: alert and oriented x 3 Psych: normal mood and affect   Lab/imaging reviewed Labs are stable No new imaging  BAL cultures with Pseudomonas,, positive AFB CTA chest with no pulmonary embolism.  Chronic lingula scarring, cavitary lesion.  New surrounding bilateral airspace disease with consolidation.  Resolved problem list   Assessment and Plan  Hemoptysis Acute episode of hemoptysis occurred early this morning, lasting approximately fifteen minutes. No further episodes reported since. CT chest shows no pulmonary embolism but reveals bilateral infiltrates suggestive of pneumonia versus hemoptysis. Hemoptysis may be related to chronic bronchiectasis, cavitary lesion and infection. - Continue IV antibiotics per ID - If hemoptysis worsens, consider bronchoscopy to identify bleeding source.  Pneumonia with bilateral infiltrates New bilateral infiltrates  on CT chest suggestive of pneumonia versus hemoptysis. Recent bronchoscopy  showed pseudomonas and mycobacterial infection. Completed course of ciprofloxacin for urinary tract infection, which should have addressed pseudomonas. Additional IV antibiotics planned to address new infiltrates. - Monitor response to antibiotics during hospital stay.  Signature:   Tyree Fluharty MD [facility] See Amion for pager  If no response to pager , please call [phone] until 7pm After 7:00 pm call Elink  [phone] 04/08/2024, 9:50 AM

## 2024-04-08 NOTE — TOC Initial Note (Signed)
 Transition of Care [facility]) - Initial/Assessment Note    Patient Details  Name: Brandi Bates MRN: [id number] Date of Birth: 29-Apr-1956  Transition of Care [facility]) CM/SW Contact:    Sonda Manuella Quill, RN Phone Number: 04/08/2024, 2:43 PM  Clinical Narrative:                 No PCP listed; spoke w/ pt in room; pt said she lives at home w/ her spouse; she plans to return at d/c; she identified POC spouse Derinda Bartus [phone]); he will provide transportation; pt verified insurance; she said her PCP is w/ [employer]; she denied SDOH risks; pt does not have DME, HH services, or home oxygen ; TOC following.  Expected Discharge Plan: Home/Self Care Barriers to Discharge: Continued Medical Work up   Patient Goals and CMS Choice Patient states their goals for this hospitalization and ongoing recovery are:: home CMS Medicare.gov Compare Post Acute Care list provided to:: Patient        Expected Discharge Plan and Services   Discharge Planning Services: CM Consult   Living arrangements for the past 2 months: Single Family Home                 DME Arranged: N/A DME Agency: NA       HH Arranged: NA HH Agency: NA        Prior Living Arrangements/Services Living arrangements for the past 2 months: Single Family Home Lives with:: Spouse Patient language and need for interpreter reviewed:: Yes Do you feel safe going back to the place where you live?: Yes      Need for Family Participation in Patient Care: Yes (Comment) Care giver support system in place?: Yes (comment) Current home services:  (n/a) Criminal Activity/Legal Involvement Pertinent to Current Situation/Hospitalization: No - Comment as needed  Activities of Daily Living   ADL Screening (condition at time of admission) Independently performs ADLs?: Yes (appropriate for developmental age) Is the patient deaf or have difficulty hearing?: No Does the patient have difficulty seeing, even when wearing  glasses/contacts?: No Does the patient have difficulty concentrating, remembering, or making decisions?: No  Permission Sought/Granted Permission sought to share information with : Case Manager Permission granted to share information with : Yes, Verbal Permission Granted  Share Information with NAME: Case Manager     Permission granted to share info w Relationship: Brandi Bates (spouse) [phone]     Emotional Assessment Appearance:: Appears stated age Attitude/Demeanor/Rapport: Gracious Affect (typically observed): Accepting Orientation: : Oriented to Self, Oriented to Place, Oriented to  Time, Oriented to Situation Alcohol / Substance Use: Not Applicable Psych Involvement: No (comment)  Admission diagnosis:  Hemoptysis [R04.2] Fatigue, unspecified type [R53.83] Patient Active Problem List   Diagnosis Date Noted   Hemoptysis 04/07/2024   Bronchiectasis with (acute) exacerbation (HCC) 04/07/2024   Cavitary lesion of lung 03/20/2024   Hypokalemia 03/20/2013   Anxiety 03/20/2013   Malignant neoplasm of upper-outer quadrant of right breast in female, estrogen receptor positive (HCC) 12/02/2011   Pulmonary thromboembolism (HCC)    Pulmonary embolism (HCC) 11/25/2011   History of breast cancer 11/21/2011   Hepatic steatosis 11/21/2011   Constipation 11/21/2011   Overweight (BMI 25.0-29.9) 11/21/2011   Acute pulmonary embolism (HCC) 11/20/2011   Community acquired pneumonia 11/20/2011   Leukocytosis 11/20/2011   Normocytic anemia 11/20/2011   History of radiation therapy    PCP:  Patient, No Pcp Per Pharmacy:   [facility] [id number], [place] - [address] [place] [address] Phone: [phone] Fax: [phone]  [facility] [address] [place] - [address] [place] [address] Phone: [phone] Fax: [phone]     Social Drivers of Health (SDOH) Social History: SDOH Screenings   Food  Insecurity: No Food Insecurity (04/08/2024)  Housing: Low Risk  (04/08/2024)  Transportation Needs: No Transportation Needs (04/08/2024)  Utilities: Not At Risk (04/08/2024)  Social Connections: Patient Declined (04/07/2024)  Tobacco Use: Medium Risk (04/07/2024)   SDOH Interventions: Food Insecurity Interventions: Intervention Not Indicated, Inpatient TOC Housing Interventions: Intervention Not Indicated, Inpatient TOC Transportation Interventions: Intervention Not Indicated, Inpatient TOC Utilities Interventions: Intervention Not Indicated, Inpatient TOC   Readmission Risk Interventions     No data to display

## 2024-04-08 NOTE — Progress Notes (Signed)
 [facility] PROGRESS NOTE   Brandi Bates FMW:[id number] DOB: August 02, 1955 DOA: 04/07/2024  PCP: Patient, No Pcp Per  Brief History: 68 y.o. female with medical history significant for GERD, breast cancer status post bilateral mastectomy, anxiety, recent bronchoscopy with BAL and biopsy due to left-sided cavitary lung lesion admitted to the hospital with complaints of hemoptysis.  Her bronchoscopy was on 03/28/2024.    Consultants: Pulmonology.  Infectious disease  Procedures: None yet    Subjective/Interval History: Patient denies any shortness of breath but has had some dry cough.  No further blood in the sputum.  Denies any chest pain.  Husband is at the bedside.  She was noted to have fever overnight.  Patient mentions loose stools as well ongoing for a week.  Denies any abdominal pain.    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  Chronic changes noted on CT angiogram.  No PE. Patient noted to have fever overnight.  Will proceed with respiratory viral panel and COVID testing. After discussions with infectious disease patient was placed on IV antibiotics.  Patient is noted to be on cefepime  and doxycycline . CT of the abdomen pelvis did not show any acute findings. CT of the lumbar spine did not show any acute findings. Labs are pending from today.  WBC was noted to be normal yesterday. Respiratory status seems to be stable.  Not requiring any oxygen  currently.  Acute diarrhea Seems to be ongoing for a week.  Abdomen is nontender on examination.  CT of the abdomen pelvis did not show any acute findings.  Stool studies have been ordered and are pending.  Essential hypertension Patient noted to be on lisinopril  and HCTZ.  Monitor blood pressures.  Monitor electrolytes and renal function.  Hyperlipidemia Continue statin.  Check LFTs.  DVT Prophylaxis: SCDs Code Status: Full code Family  Communication: Discussed with patient and her husband Disposition Plan: Hopefully return home when improved  Status is: Observation The patient will require care spanning > 2 midnights and should be moved to inpatient because: Need for IV antibiotics      Medications: Scheduled:  atorvastatin   10 mg Oral Daily   famotidine   20 mg Oral Daily   lisinopril   20 mg Oral Daily   And   hydrochlorothiazide   12.5 mg Oral Daily   Continuous:  ceFEPime  (MAXIPIME ) IV 2 g (04/08/24 0456)   doxycycline  (VIBRAMYCIN ) IV     PRN:acetaminophen  **OR** acetaminophen , albuterol , ondansetron  **OR** ondansetron  (ZOFRAN ) IV, traZODone   Antibiotics: Anti-infectives (From admission, onward)    Start     Dose/Rate Route Frequency Ordered Stop   04/08/24 1000  doxycycline  (VIBRAMYCIN ) 100 mg in sodium chloride  0.9 % 250 mL IVPB        100 mg 125 mL/hr over 120 Minutes Intravenous Every 12 hours 04/07/24 1253 04/12/24 0959   04/07/24 1230  ceFEPIme  (MAXIPIME ) 2 g in sodium chloride  0.9 % 100 mL IVPB        2 g 200 mL/hr over 30 Minutes Intravenous Every 8 hours 04/07/24 1218     04/07/24 1115  cefTRIAXone  (ROCEPHIN ) 1 g in sodium chloride  0.9 % 100 mL IVPB        1 g 200 mL/hr over 30 Minutes Intravenous  Once 04/07/24 1107 04/07/24 1724   04/07/24 1115  azithromycin  (ZITHROMAX ) 500 mg in sodium chloride  0.9 % 250 mL IVPB  Status:  Discontinued        500 mg 250 mL/hr over 60 Minutes  Intravenous  Once 04/07/24 1107 04/07/24 1316       Objective:  Vital Signs  Vitals:   04/07/24 1747 04/07/24 2140 04/08/24 0217 04/08/24 0515  BP: (!) 116/49 (!) 123/54 132/77 132/72  Pulse: 75 67 73 77  Resp: 19 18 18 18   Temp: (!) 101.2 F (38.4 C) 98.9 F (37.2 C) 99.5 F (37.5 C) 99.5 F (37.5 C)  TempSrc: Oral Oral Oral Oral  SpO2: 96% 94% 97% 96%  Weight:      Height:        Intake/Output Summary (Last 24 hours) at 04/08/2024 0916 Last data filed at 04/07/2024 1800 Gross per 24 hour  Intake 2913  ml  Output --  Net 2913 ml   Filed Weights   04/07/24 0756  Weight: 79.4 kg    General appearance: Awake alert.  In no distress Resp: Crackles bilateral lungs right more than left.  No wheezing.  Occasional rhonchi.  Normal effort at rest. Cardio: S1-S2 is normal regular.  No S3-S4.  No rubs murmurs or bruit GI: Abdomen is soft.  Nontender nondistended.  Bowel sounds are present normal.  No masses organomegaly Extremities: No edema.  Full range of motion of lower extremities. Neurologic: Alert and oriented x3.  No focal neurological deficits.    Lab Results:  Data Reviewed: I have personally reviewed following labs and reports of the imaging studies  CBC: Recent Labs  Lab 04/07/24 0822  WBC 9.4  HGB 11.8*  HCT 38.0  MCV 88.8  PLT 302    Basic Metabolic Panel: Recent Labs  Lab 04/07/24 0822  NA 140  K 3.6  CL 104  CO2 20*  GLUCOSE 113*  BUN 12  CREATININE 0.69  CALCIUM  9.0    GFR: Estimated Creatinine Clearance: 69.6 mL/min (by C-G formula based on SCr of 0.69 mg/dL).  Liver Function Tests: No results for input(s): AST, ALT, ALKPHOS, BILITOT, PROT, ALBUMIN in the last 168 hours.   Radiology Studies: CT L-SPINE NO CHARGE Result Date: 04/07/2024 CLINICAL DATA:  67 year old female with recent lung biopsy, hemoptysis, suspected urinary infection, on antibiotics, fatigue, increasing headache. History of breast cancer. EXAM: CT LUMBAR SPINE WITH CONTRAST TECHNIQUE: Technique: Multiplanar CT images of the lumbar spine were reconstructed from contemporary CT of the Abdomen and Pelvis. RADIATION DOSE REDUCTION: This exam was performed according to the departmental dose-optimization program which includes automated exposure control, adjustment of the mA and/or kV according to patient size and/or use of iterative reconstruction technique. CONTRAST:  No additional COMPARISON:  CTA Chest,, CT abdomen, and Pelvis today reported separately. CT Abdomen and Pelvis  11/20/2011. FINDINGS: Segmentation: Transitional anatomy, partially sacralized L5 level. Partially this digital L5-S1 disc space. Correlation with radiographs is recommended prior to any operative intervention. Alignment: Stable lordosis since 2013. Minimal scoliosis. No spondylolisthesis. Vertebrae: Maintained vertebral height. Bone mineralization appears stable since 2013. Lumbar vertebrae appear intact. Visible sacrum and SI joints intact. No acute or suspicious osseous lesion identified. Paraspinal and other soft tissues: Abdomen and pelvis reported separately. Negative lumbar paraspinal soft tissues. Disc levels: Mild for age lumbar spine degeneration appears stable since 2013. Capacious spinal canal. IMPRESSION: 1. Transitional anatomy with partially sacralized L5 level, otherwise normal for age CT appearance of the Lumbar Spine. 2. CTA Chest, CT Abdomen and Pelvis today reported separately. Electronically Signed   By: VEAR Hurst M.D.   On: 04/07/2024 10:29   CT ABDOMEN PELVIS W CONTRAST Result Date: 04/07/2024 CLINICAL DATA:  68 year old female with recent lung biopsy,  hemoptysis, suspected urinary infection, on antibiotics, fatigue, increasing headache. History of breast cancer. EXAM: CT ABDOMEN AND PELVIS WITH CONTRAST TECHNIQUE: Multidetector CT imaging of the abdomen and pelvis was performed using the standard protocol following bolus administration of intravenous contrast. RADIATION DOSE REDUCTION: This exam was performed according to the departmental dose-optimization program which includes automated exposure control, adjustment of the mA and/or kV according to patient size and/or use of iterative reconstruction technique. CONTRAST:  OMNIPAQUE  IOHEXOL  350 MG/ML SOLN COMPARISON:  CTA chest and lumbar spine today reported separately. CT Abdomen and Pelvis 11/20/2011. FINDINGS: Lower chest: Stable to CTA reported separately today. Hepatobiliary: Chronic cholecystectomy. Hepatic steatosis is less  apparent. No discrete liver lesion. Pancreas: Negative. Spleen: Stable at the upper limits of normal. Adrenals/Urinary Tract: Negative adrenal glands and kidneys. No delayed excretory images. Renal collecting systems and ureters are diminutive. Unremarkable urinary bladder. Chronic pelvic phleboliths. Stomach/Bowel: Increased large bowel retained stool compared to 2013. Mild large bowel redundancy. Normal retrocecal appendix containing gas on series 4, image 68. Nondilated small bowel. Distal esophageal phrenic ampulla, less likely small hiatal hernia. Otherwise negative stomach and duodenum. No pneumoperitoneum, free fluid, mesenteric inflammation. Vascular/Lymphatic: Aortoiliac calcified atherosclerosis. Normal caliber abdominal aorta. Major arterial structures remain patent. Portal venous system is patent. No lymphadenopathy. Reproductive: Chronically absent uterus, diminutive or absent ovaries. Other: No pelvis free fluid. Musculoskeletal: Lumbar spine reported separately today. Otherwise no acute or suspicious osseous lesion identified. IMPRESSION: 1. No acute or inflammatory process identified in the abdomen or pelvis. Aortic Atherosclerosis (ICD10-I70.0). 2. Abnormal lung bases, see Chest CTA today reported separately. 3. Lumbar spine reported separately today. Electronically Signed   By: VEAR Hurst M.D.   On: 04/07/2024 10:27   CT Angio Chest PE W/Cm &/Or Wo Cm Result Date: 04/07/2024 CLINICAL DATA:  68 year old female with recent lung biopsy, hemoptysis, suspected urinary infection, on antibiotics, fatigue, increasing headache. History of breast cancer. EXAM: CT ANGIOGRAPHY CHEST WITH CONTRAST TECHNIQUE: Multidetector CT imaging of the chest was performed using the standard protocol during bolus administration of intravenous contrast. Multiplanar CT image reconstructions and MIPs were obtained to evaluate the vascular anatomy. RADIATION DOSE REDUCTION: This exam was performed according to the departmental  dose-optimization program which includes automated exposure control, adjustment of the mA and/or kV according to patient size and/or use of iterative reconstruction technique. CONTRAST:  OMNIPAQUE  IOHEXOL  350 MG/ML SOLN COMPARISON:  Chest CT without contrast 02/08/2024. FINDINGS: Cardiovascular: Good contrast bolus timing in the pulmonary arterial tree. No pulmonary artery filling defect is identified. Calcified coronary artery atherosclerosis (series 11, image 170) redemonstrated. Heart size remains normal. No pericardial effusion. Negative thoracic aorta aside from mild atherosclerosis. Mediastinum/Nodes: Negative for mediastinal mass or lymphadenopathy. Lungs/Pleura: Lower lung volumes compared to July. Major airways remain patent. Chronic anterior left upper lobe architectural distortion and cavitary lesion (series 13, image 57) size and configuration of which not significantly changed from July, and adhesion to the overlying pleura appears stable. However, extensive new surrounding peribronchial and airspace opacity ranging from sub solid opacity to new surrounding consolidation. And widespread similar extensive peribronchial sub solid and solid opacity scattered in the right upper lobe, both lower lobes. Comparatively mild involvement in the right middle lobe. No areas of obvious contrast extravasation in the lungs. No pneumothorax or pleural effusion. Upper Abdomen: CT Abdomen and Pelvis reported separately. Musculoskeletal: Stable visualized osseous structures. No acute or suspicious osseous lesion identified. Bilateral mastectomy. Review of the MIP images confirms the above findings. IMPRESSION: 1. Negative for  acute pulmonary embolus. And no obvious contrast extravasation into the lungs. 2. Chronic left upper lobe architectural distortion and cavitary lesion with extensive new surrounding and widespread bilateral pulmonary airspace disease with areas of early consolidation. No associated pleural  effusion. Major airways remain patent. Broad differential considerations including bilateral alveolar hemorrhage, vasculitis, pneumonia, drug reaction or other non-infectious inflammation. 3. CT Abdomen and Pelvis reported separately. 4. Calcified coronary artery,  Aortic Atherosclerosis (ICD10-I70.0). Electronically Signed   By: VEAR Hurst M.D.   On: 04/07/2024 10:23   CT Head Wo Contrast Result Date: 04/07/2024 CLINICAL DATA:  68 year old female with recent lung biopsy, hemoptysis, suspected urinary infection, on antibiotics, fatigue, increasing headache. EXAM: CT HEAD WITHOUT CONTRAST TECHNIQUE: Contiguous axial images were obtained from the base of the skull through the vertex without intravenous contrast. RADIATION DOSE REDUCTION: This exam was performed according to the departmental dose-optimization program which includes automated exposure control, adjustment of the mA and/or kV according to patient size and/or use of iterative reconstruction technique. COMPARISON:  None Available. FINDINGS: Brain: Cerebral volume is within normal limits for age. No midline shift, ventriculomegaly, mass effect, evidence of mass lesion, intracranial hemorrhage or evidence of cortically based acute infarction. Gray-white matter differentiation is within normal limits throughout the brain. Vascular: Calcified atherosclerosis at the skull base. No suspicious intracranial vascular hyperdensity. Skull: Intact. Small and circumscribed left parietal bone lucency is likely benign on series 5, image 33. No acute osseous abnormality identified. Sinuses/Orbits: Visualized paranasal sinuses and mastoids are clear. Other: Negative orbit and scalp soft tissues, postoperative changes to both globes. IMPRESSION: Normal for age noncontrast Head CT. Electronically Signed   By: VEAR Hurst M.D.   On: 04/07/2024 10:15   DG Chest 2 View Result Date: 04/07/2024 EXAM: 2 VIEW(S) XRAY OF THE CHEST 04/07/2024 08:35:32 AM COMPARISON: 03/28/24. CLINICAL  HISTORY: Hemoptysis. Pt is coughing up blood and feeling very weak. FINDINGS: LUNGS AND PLEURA: Redemonstrated left upper lobe cavitary lung mass. Interval development of diffuse interstitial opacities with multifocal airspace densities within the right lung. No pleural fluid or pneumothorax identified. HEART AND MEDIASTINUM: No acute abnormality of the cardiac and mediastinal silhouettes. BONES AND SOFT TISSUES: Surgical clips noted in the left axilla. No acute osseous abnormality. IMPRESSION: 1. Interval development of diffuse interstitial opacities with multifocal airspace densities within the right lung. Differential considerations include diffuse pulmonary edema versus multifocal infection. 2. Redemonstrated left upper lobe cavitary lung mass. Electronically signed by: Waddell Calk MD 04/07/2024 08:43 AM EDT RP Workstation: [id number]       LOS: 0 days   Joette Pebbles  [facility] Pager on [email or web address]  04/08/2024, 9:16 AM

## 2024-04-08 NOTE — Plan of Care (Signed)
  Problem: Education: Goal: Knowledge of General Education information will improve Description: Including pain rating scale, medication(s)/side effects and non-pharmacologic comfort measures Outcome: Progressing   Problem: Health Behavior/Discharge Planning: Goal: Ability to manage health-related needs will improve Outcome: Progressing   Problem: Clinical Measurements: Goal: Ability to maintain clinical measurements within normal limits will improve Outcome: Progressing   Problem: Elimination: Goal: Will not experience complications related to bowel motility Outcome: Progressing Goal: Will not experience complications related to urinary retention Outcome: Progressing   Problem: Pain Managment: Goal: General experience of comfort will improve and/or be controlled Outcome: Progressing   Problem: Safety: Goal: Ability to remain free from injury will improve Outcome: Progressing   Problem: Skin Integrity: Goal: Risk for impaired skin integrity will decrease Outcome: Progressing

## 2024-04-09 DIAGNOSIS — R197 Diarrhea, unspecified: Secondary | ICD-10-CM | POA: Diagnosis not present

## 2024-04-09 DIAGNOSIS — J189 Pneumonia, unspecified organism: Secondary | ICD-10-CM | POA: Diagnosis not present

## 2024-04-09 DIAGNOSIS — R042 Hemoptysis: Secondary | ICD-10-CM | POA: Diagnosis not present

## 2024-04-09 LAB — COMPREHENSIVE METABOLIC PANEL WITH GFR
ALT: 35 U/L (ref 0–44)
AST: 21 U/L (ref 15–41)
Albumin: 3.5 g/dL (ref 3.5–5.0)
Alkaline Phosphatase: 68 U/L (ref 38–126)
Anion gap: 14 (ref 5–15)
BUN: 12 mg/dL (ref 8–23)
CO2: 20 mmol/L — ABNORMAL LOW (ref 22–32)
Calcium: 9.5 mg/dL (ref 8.9–10.3)
Chloride: 104 mmol/L (ref 98–111)
Creatinine, Ser: 0.61 mg/dL (ref 0.44–1.00)
GFR, Estimated: >60 mL/min (ref >60)
Glucose, Bld: 119 mg/dL — ABNORMAL HIGH (ref 70–99)
Potassium: 3.9 mmol/L (ref 3.5–5.1)
Sodium: 137 mmol/L (ref 135–145)
Total Bilirubin: 0.6 mg/dL (ref 0.0–1.2)
Total Protein: 6.8 g/dL (ref 6.5–8.1)

## 2024-04-09 LAB — CBC
HCT: 36.6 % (ref 36.0–46.0)
Hemoglobin: 11 g/dL — ABNORMAL LOW (ref 12.0–15.0)
MCH: 27.6 pg (ref 26.0–34.0)
MCHC: 30.1 g/dL (ref 30.0–36.0)
MCV: 92 fL (ref 80.0–100.0)
Platelets: 287 K/uL (ref 150–400)
RBC: 3.98 MIL/uL (ref 3.87–5.11)
RDW: 13.7 % (ref 11.5–15.5)
WBC: 9.8 K/uL (ref 4.0–10.5)
nRBC: 0 % (ref 0.0–0.2)

## 2024-04-09 LAB — MAGNESIUM: Magnesium: 1.9 mg/dL (ref 1.7–2.4)

## 2024-04-09 MED ORDER — ACETAMINOPHEN 325 MG PO TABS
650.0000 mg | ORAL_TABLET | Freq: Four times a day (QID) | ORAL | Status: DC | PRN
  Administered 2024-04-09 – 2024-04-10 (×2): 650 mg via ORAL
  Filled 2024-04-09 (×2): qty 2

## 2024-04-09 MED ORDER — MAGNESIUM SULFATE 2 GM/50ML IV SOLN
2.0000 g | Freq: Once | INTRAVENOUS | Status: AC
  Administered 2024-04-09: 2 g via INTRAVENOUS
  Filled 2024-04-09: qty 50

## 2024-04-09 MED ORDER — ACETAMINOPHEN 650 MG RE SUPP: 650.0000 mg | Freq: Four times a day (QID) | RECTAL | Status: DC | PRN

## 2024-04-09 MED ORDER — POTASSIUM CHLORIDE CRYS ER 20 MEQ PO TBCR
40.0000 meq | EXTENDED_RELEASE_TABLET | Freq: Once | ORAL | Status: AC
Start: 2024-04-09 — End: 2024-04-09
  Administered 2024-04-09: 40 meq via ORAL
  Filled 2024-04-09: qty 2

## 2024-04-09 NOTE — Progress Notes (Signed)
 [facility] PROGRESS NOTE   Brandi Bates FMW:[id number] DOB: 09-16-1955 DOA: 04/07/2024  PCP: Patient, No Pcp Per  Brief History: 68 y.o. female with medical history significant for GERD, breast cancer status post bilateral mastectomy, anxiety, recent bronchoscopy with BAL and biopsy due to left-sided cavitary lung lesion admitted to the hospital with complaints of hemoptysis.  Her bronchoscopy was on 03/28/2024.    Consultants: Pulmonology.  Infectious disease  Procedures: None yet    Subjective/Interval History: Patient mentions her cough is improving.  It is dry cough now.  Has not coughed up any blood in the last 2 days.  Denies any shortness of breath at rest.  She has not walked in the hallway yet.  Husband is at the bedside.     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  Chronic changes noted on CT angiogram.  No PE. Patient developed fever.  Respiratory viral panel and COVID test were negative. WBC is noted to be normal.  Feels better this morning. Remains on cefepime  and doxycycline .  Patient was placed on these antibiotics after discussion with ID. CT of the abdomen pelvis did not show any acute findings. CT of the lumbar spine did not show any acute findings. Respiratory status is stable.  No oxygen  requirements.  Will see how she does over the next 24 hours and then discussed with ID tomorrow regarding antibiotics going forward. Pulmonology has been following as well.  Acute diarrhea Seems to be ongoing for a week.  Abdomen is nontender on examination.  CT of the abdomen pelvis did not show any acute findings.  Stool studies were ordered.  However it looks like her diarrhea may have subsided.  Essential hypertension Patient noted to be on lisinopril  and HCTZ.  Monitor blood pressures.  Monitor electrolytes and renal function.  Hypokalemia Supplemented.  Hyperlipidemia Continue  statin.  LFTs are normal.  DVT Prophylaxis: SCDs Code Status: Full code Family Communication: Discussed with patient and her husband Disposition Plan: Hopefully return home when improved    Medications: Scheduled:  atorvastatin   10 mg Oral Daily   famotidine   20 mg Oral Daily   lisinopril   20 mg Oral Daily   And   hydrochlorothiazide   12.5 mg Oral Daily   Continuous:  ceFEPime  (MAXIPIME ) IV 2 g (04/09/24 0514)   doxycycline  (VIBRAMYCIN ) IV 100 mg (04/09/24 1031)   PRN:acetaminophen  **OR** acetaminophen , albuterol , ondansetron  **OR** ondansetron  (ZOFRAN ) IV, traZODone   Antibiotics: Anti-infectives (From admission, onward)    Start     Dose/Rate Route Frequency Ordered Stop   04/08/24 1000  doxycycline  (VIBRAMYCIN ) 100 mg in sodium chloride  0.9 % 250 mL IVPB        100 mg 125 mL/hr over 120 Minutes Intravenous Every 12 hours 04/07/24 1253 04/12/24 0959   04/07/24 1230  ceFEPIme  (MAXIPIME ) 2 g in sodium chloride  0.9 % 100 mL IVPB        2 g 200 mL/hr over 30 Minutes Intravenous Every 8 hours 04/07/24 1218     04/07/24 1115  cefTRIAXone  (ROCEPHIN ) 1 g in sodium chloride  0.9 % 100 mL IVPB        1 g 200 mL/hr over 30 Minutes Intravenous  Once 04/07/24 1107 04/07/24 1724   04/07/24 1115  azithromycin  (ZITHROMAX ) 500 mg in sodium chloride  0.9 % 250 mL IVPB  Status:  Discontinued        500 mg 250 mL/hr over 60 Minutes Intravenous  Once 04/07/24 1107 04/07/24 1316  Objective:  Vital Signs  Vitals:   04/08/24 0515 04/08/24 1225 04/08/24 2033 04/09/24 0438  BP: 132/72 (!) 147/72 (!) 143/73 138/68  Pulse: 77 75 72 77  Resp: 18 15 18 18   Temp: 99.5 F (37.5 C) 98.3 F (36.8 C) 100 F (37.8 C) 98.7 F (37.1 C)  TempSrc: Oral  Oral Oral  SpO2: 96% 95% 95% 93%  Weight:      Height:       No intake or output data in the 24 hours ending 04/09/24 1034  Filed Weights   04/07/24 0756  Weight: 79.4 kg    General appearance: Awake alert.  In no distress Resp:  Normal effort at rest.  Improved air entry bilaterally with few crackles bilateral lungs right more than left but improved from yesterday.  No wheezing or rhonchi. Cardio: S1-S2 is normal regular.  No S3-S4.  No rubs murmurs or bruit GI: Abdomen is soft.  Nontender nondistended.  Bowel sounds are present normal.  No masses organomegaly Extremities: No edema.  Full range of motion of lower extremities. Neurologic: Alert and oriented x3.  No focal neurological deficits.    Lab Results:  Data Reviewed: I have personally reviewed following labs and reports of the imaging studies  CBC: Recent Labs  Lab 04/07/24 0822 04/08/24 0817 04/09/24 0527  WBC 9.4 8.7 9.8  HGB 11.8* 11.4* 11.0*  HCT 38.0 36.7 36.6  MCV 88.8 88.9 92.0  PLT 302 297 287    Basic Metabolic Panel: Recent Labs  Lab 04/07/24 0822 04/08/24 0817 04/09/24 0527  NA 140 142 137  K 3.6 3.4* 3.9  CL 104 106 104  CO2 20* 22 20*  GLUCOSE 113* 104* 119*  BUN 12 11 12   CREATININE 0.69 0.62 0.61  CALCIUM  9.0 8.5* 9.5  MG  --   --  1.9    GFR: Estimated Creatinine Clearance: 69.6 mL/min (by C-G formula based on SCr of 0.61 mg/dL).  Liver Function Tests: Recent Labs  Lab 04/09/24 0527  AST 21  ALT 35  ALKPHOS 68  BILITOT 0.6  PROT 6.8  ALBUMIN 3.5     Radiology Studies: No results found.      LOS: 1 day   [name]  [facility] Pager on [email or web address]  04/09/2024, 10:34 AM

## 2024-04-09 NOTE — Plan of Care (Signed)

## 2024-04-09 NOTE — Progress Notes (Signed)
 NAME:  Brandi Bates, MRN:  [id number], DOB:  [birth year], LOS: 1 ADMISSION DATE:  04/07/2024, CONSULTATION DATE: 04/07/2024 REFERRING MD: EDP, CHIEF COMPLAINT: Hemoptysis  History of Present Illness:  The patient, with bronchiectasis, cavitary lung lesion presents with hemoptysis.  Hemoptysis - Hemoptysis occurred early this morning during a coughing episode lasting approximately fifteen minutes - Blood was noticed after the episode, as it was dark at the time of coughing - Single episode of hemoptysis with no recurrence since this morning  Cavitary left lingula lesion, bronchiectasis and airway infection - Bronchoscopy performed on Tuesday, March 28, 2024 - Cultures are growing pseudomonas infection and positive AFB smear.  Pathology showed inflammation, granulomas and no evidence of malignancy - Initial post-procedure course was unremarkable  Urinary tract infection symptoms - Severe dysuria developed on Thursday following bronchoscopy, described as 'felt like razors coming out' when urinating - Difficulty voiding urine - Evaluated at urgent care at [facility] urgent care in [place] - Diagnosed with severe kidney infection - Completed a course of ciprofloxacin today - Significant improvement in urinary symptoms since starting antibiotics  Recent trauma - Sustained a fall on Friday night  Pertinent  Medical History    has a past medical history of Allergy, Anxiety, Bladder cystocele (11/20/11), Breast cancer (HCC) (10/23/10), Breast cancer (HCC) (11/10/11), Breast wound, Chronic low back pain, Eczema, GERD (gastroesophageal reflux disease), Hepatic steatosis (11/20/11), History of cancer chemotherapy, History of radiation therapy (04/23/11 thru 06/08/11), Lymphedema of arm, Migraines, Neuromuscular disorder (HCC), Numbness of feet, Pneumonia (April 2013), PONV (postoperative nausea and vomiting), Pulmonary thromboembolism (HCC) (11/20/11), and Shortness of breath on exertion.    Significant Hospital Events: Including procedures, antibiotic start and stop dates in addition to other pertinent events   9/12 Admit for hemoptysis, pneumonia  Interim History / Subjective:   No more episodes of hemoptysis  Objective    Blood pressure 138/68, pulse 77, temperature 98.7 F (37.1 C), temperature source Oral, resp. rate 18, height 5' 4 (1.626 m), weight 79.4 kg, SpO2 93%.       No intake or output data in the 24 hours ending 04/09/24 1137  Filed Weights   04/07/24 0756  Weight: 79.4 kg    Examination: Blood pressure (!) 179/102, pulse 79, temperature (!) 97.5 F (36.4 C), resp. rate 12, height 5' 4 (1.626 m), weight 79.4 kg, SpO2 97%. Gen:      No acute distress HEENT:  EOMI, sclera anicteric Neck:     No masses; no thyromegaly Lungs:    Clear to auscultation bilaterally; normal respiratory effort CV:         Regular rate and rhythm; no murmurs Abd:      + bowel sounds; soft, non-tender; no palpable masses, no distension Ext:    No edema; adequate peripheral perfusion Neuro: alert and oriented x 3 Psych: normal mood and affect   Lab/imaging reviewed BAL cultures with Pseudomonas,, positive AFB CTA chest with no pulmonary embolism.  Chronic lingula scarring, cavitary lesion.  New surrounding bilateral airspace disease with consolidation.  Resolved problem list   Assessment and Plan  Hemoptysis Acute episode of hemoptysis occurred early this morning, lasting approximately fifteen minutes. No further episodes reported since. CT chest shows no pulmonary embolism but reveals bilateral infiltrates suggestive of pneumonia versus hemoptysis. Hemoptysis may be related to chronic bronchiectasis, cavitary lesion and infection. - Continue antibiotics per ID. - Okay to discharge from our viewpoint on oral therapy for 7 days  Pneumonia with bilateral infiltrates New bilateral  infiltrates on CT chest suggestive of pneumonia versus hemoptysis. Recent bronchoscopy  showed pseudomonas and mycobacterial infection. Completed course of ciprofloxacin for urinary tract infection, which should have addressed pseudomonas. Additional IV antibiotics planned to address new infiltrates. - Monitor response to antibiotics during hospital stay.  Signature:   Vernee Baines MD [facility] See Amion for pager  If no response to pager , please call [phone] until 7pm After 7:00 pm call Elink  [phone] 04/09/2024, 11:37 AM

## 2024-04-09 NOTE — Plan of Care (Signed)
  Problem: Education: Goal: Knowledge of General Education information will improve Description: Including pain rating scale, medication(s)/side effects and non-pharmacologic comfort measures Outcome: Progressing   Problem: Clinical Measurements: Goal: Will remain free from infection Outcome: Progressing   Problem: Activity: Goal: Risk for activity intolerance will decrease Outcome: Progressing   Problem: Nutrition: Goal: Adequate nutrition will be maintained Outcome: Progressing   Problem: Elimination: Goal: Will not experience complications related to bowel motility Outcome: Progressing   Problem: Pain Managment: Goal: General experience of comfort will improve and/or be controlled Outcome: Progressing   Problem: Safety: Goal: Ability to remain free from injury will improve Outcome: Progressing

## 2024-04-10 ENCOUNTER — Other Ambulatory Visit (HOSPITAL_COMMUNITY): Payer: Self-pay

## 2024-04-10 ENCOUNTER — Other Ambulatory Visit: Payer: Self-pay

## 2024-04-10 DIAGNOSIS — R042 Hemoptysis: Secondary | ICD-10-CM | POA: Diagnosis not present

## 2024-04-10 DIAGNOSIS — A319 Mycobacterial infection, unspecified: Secondary | ICD-10-CM

## 2024-04-10 LAB — FUNGUS CULTURE WITH STAIN

## 2024-04-10 LAB — FUNGUS CULTURE RESULT

## 2024-04-10 LAB — FUNGAL ORGANISM REFLEX

## 2024-04-10 MED ORDER — FLUCONAZOLE 100 MG PO TABS
ORAL_TABLET | ORAL | 0 refills | Status: DC
Start: 2024-04-10 — End: 2024-05-21
  Filled 2024-04-10: qty 2, 2d supply, fill #0

## 2024-04-10 MED ORDER — AMOXICILLIN-POT CLAVULANATE 875-125 MG PO TABS
1.0000 | ORAL_TABLET | Freq: Two times a day (BID) | ORAL | 0 refills | Status: AC
  Filled 2024-04-10: qty 14, 7d supply, fill #0

## 2024-04-10 MED ORDER — CIPROFLOXACIN HCL 500 MG PO TABS: 500.0000 mg | ORAL_TABLET | Freq: Two times a day (BID) | ORAL | Status: DC

## 2024-04-10 MED ORDER — CIPROFLOXACIN HCL 500 MG PO TABS: 750.0000 mg | ORAL_TABLET | Freq: Two times a day (BID) | ORAL | 0 refills | Status: AC

## 2024-04-10 MED ORDER — AMOXICILLIN-POT CLAVULANATE 875-125 MG PO TABS: 1.0000 | ORAL_TABLET | Freq: Two times a day (BID) | ORAL | Status: DC

## 2024-04-10 MED ORDER — CIPROFLOXACIN HCL 500 MG PO TABS
500.0000 mg | ORAL_TABLET | Freq: Two times a day (BID) | ORAL | 0 refills | Status: DC
  Filled 2024-04-10: qty 14, 7d supply, fill #0

## 2024-04-10 NOTE — Progress Notes (Signed)
 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   Discussed referral to ID to discuss treatment.   Patient is in agreement.    Zola Herter, MD [facility] Office: [phone]   See Amion for personal pager PCCM on call pager [phone] until 7pm. Please call Elink 7p-7a. [phone]

## 2024-04-10 NOTE — Progress Notes (Signed)
 Discharge meds in a secure bag delivered to pt in room by this RN

## 2024-04-10 NOTE — Progress Notes (Signed)
   04/10/24 0931  TOC Brief Assessment  Insurance and Status Reviewed  Patient has primary care physician Yes  Home environment has been reviewed home  Prior level of function: independent  Prior/Current Home Services No current home services  Social Drivers of Health Review SDOH reviewed no interventions necessary  Readmission risk has been reviewed Yes  Transition of care needs no transition of care needs at this time

## 2024-04-10 NOTE — Progress Notes (Signed)
 AVS reviewed w/ pt  & husband who verbalized an understanding. PIV removed as noted, pt dressed for d/c to home

## 2024-04-10 NOTE — Discharge Summary (Signed)
 [facility]  Physician Discharge Summary   Patient ID: Brandi Bates MRN: [id number] DOB/AGE: 08-27-1955 68 y.o.  Admit date: 04/07/2024 Discharge date: 04/10/2024    PCP: Dayna Motto, DO  DISCHARGE DIAGNOSES:  MAC positive Pseudomonas Hemoptysis, resolved Essential hypertension   RECOMMENDATIONS FOR OUTPATIENT FOLLOW UP: Outpatient follow-up with pulmonology and infectious disease   Home Health: None Equipment/Devices: None  CODE STATUS: Full code  DISCHARGE CONDITION: fair  Diet recommendation: As before  INITIAL HISTORY: 68 y.o. female with medical history significant for GERD, breast cancer status post bilateral mastectomy, anxiety, recent bronchoscopy with BAL and biopsy due to left-sided cavitary lung lesion admitted to the hospital with complaints of hemoptysis.  Her bronchoscopy was on 03/28/2024.     Consultants: Pulmonology.  Infectious disease  HOSPITAL COURSE:   Hemoptysis/concern for pneumonia/MAC/Pseudomonas infection Imaging studies raised concern for pneumonia.  Chronic changes noted on CT angiogram.  No PE. Patient developed fever.  Respiratory viral panel and COVID test were negative. WBC is noted to be normal.   Patient was initially started on doxycycline  and cefepime .  Improvement noted clinically.  Hemoptysis has resolved.  ID recommends transitioning to Augmentin  and Cipro  at discharge. CT of the abdomen pelvis did not show any acute findings. CT of the lumbar spine did not show any acute findings. Patient was also seen by pulmonology.   Acute diarrhea Resolved.  CT abdomen did not show any acute findings.     Essential hypertension   Hypokalemia Supplemented.   Hyperlipidemia Continue statin.  LFTs are normal.   Patient is stable.  Okay for discharge home today.    Initially prescription was written for ciprofloxacin  500 mg twice daily however this was changed over to 750 mg twice daily.  Patient was called and told to take  1-1/2 tablets of the 500 mg tablet twice a day.  Additional Cipro  prescription was sent to her [facility] pharmacy.  PERTINENT LABS:  The results of significant diagnostics from this hospitalization (including imaging, microbiology, ancillary and laboratory) are listed below for reference.    Microbiology: Recent Results (from the past 240 hours)  Respiratory (~20 pathogens) panel by PCR     Status: None   Collection Time: 04/08/24  9:29 AM   Specimen: Nasopharyngeal Swab; Respiratory  Result Value Ref Range Status   Adenovirus NOT DETECTED NOT DETECTED Final   Coronavirus 229E NOT DETECTED NOT DETECTED Final    Comment: (NOTE) The Coronavirus on the Respiratory Panel, DOES NOT test for the novel  Coronavirus (2019 nCoV)    Coronavirus HKU1 NOT DETECTED NOT DETECTED Final   Coronavirus NL63 NOT DETECTED NOT DETECTED Final   Coronavirus OC43 NOT DETECTED NOT DETECTED Final   Metapneumovirus NOT DETECTED NOT DETECTED Final   Rhinovirus / Enterovirus NOT DETECTED NOT DETECTED Final   Influenza A NOT DETECTED NOT DETECTED Final   Influenza B NOT DETECTED NOT DETECTED Final   Parainfluenza Virus 1 NOT DETECTED NOT DETECTED Final   Parainfluenza Virus 2 NOT DETECTED NOT DETECTED Final   Parainfluenza Virus 3 NOT DETECTED NOT DETECTED Final   Parainfluenza Virus 4 NOT DETECTED NOT DETECTED Final   Respiratory Syncytial Virus NOT DETECTED NOT DETECTED Final   Bordetella pertussis NOT DETECTED NOT DETECTED Final   Bordetella Parapertussis NOT DETECTED NOT DETECTED Final   Chlamydophila pneumoniae NOT DETECTED NOT DETECTED Final   Mycoplasma pneumoniae NOT DETECTED NOT DETECTED Final    Comment: Performed at [facility], [address] [place], [place] [address]  SARS Coronavirus 2 by RT PCR (hospital order, performed in [facility] hospital lab) *cepheid single result test* Anterior Nasal Swab     Status: None   Collection Time: 04/08/24  9:29 AM   Specimen: Anterior Nasal Swab   Result Value Ref Range Status   SARS Coronavirus 2 by RT PCR NEGATIVE NEGATIVE Final    Comment: (NOTE) SARS-CoV-2 target nucleic acids are NOT DETECTED.  The SARS-CoV-2 RNA is generally detectable in upper and lower respiratory specimens during the acute phase of infection. The lowest concentration of SARS-CoV-2 viral copies this assay can detect is 250 copies / mL. A negative result does not preclude SARS-CoV-2 infection and should not be used as the sole basis for treatment or other patient management decisions.  A negative result may occur with improper specimen collection / handling, submission of specimen other than nasopharyngeal swab, presence of viral mutation(s) within the areas targeted by this assay, and inadequate number of viral copies (<250 copies / mL). A negative result must be combined with clinical observations, patient history, and epidemiological information.  Fact Sheet for Patients:   RoadLapTop.co.za  Fact Sheet for Healthcare Providers: http://kim-miller.com/  This test is not yet approved or  cleared by the [place]  FDA and has been authorized for detection and/or diagnosis of SARS-CoV-2 by FDA under an Emergency Use Authorization (EUA).  This EUA will remain in effect (meaning this test can be used) for the duration of the COVID-19 declaration under Section 564(b)(1) of the Act, 21 U.S.C. section 360bbb-3(b)(1), unless the authorization is terminated or revoked sooner.  Performed at [facility], [address] [place], [place] [address]      Labs:   Basic Metabolic Panel: Recent Labs  Lab 04/07/24 0822 04/08/24 0817 04/09/24 0527  NA 140 142 137  K 3.6 3.4* 3.9  CL 104 106 104  CO2 20* 22 20*  GLUCOSE 113* 104* 119*  BUN 12 11 12   CREATININE 0.69 0.62 0.61  CALCIUM  9.0 8.5* 9.5  MG  --   --  1.9   Liver Function Tests: Recent Labs  Lab 04/09/24 0527  AST 21   ALT 35  ALKPHOS 68  BILITOT 0.6  PROT 6.8  ALBUMIN 3.5   CBC: Recent Labs  Lab 04/07/24 0822 04/08/24 0817 04/09/24 0527  WBC 9.4 8.7 9.8  HGB 11.8* 11.4* 11.0*  HCT 38.0 36.7 36.6  MCV 88.8 88.9 92.0  PLT 302 297 287     IMAGING STUDIES CT L-SPINE NO CHARGE Result Date: 04/07/2024 CLINICAL DATA:  68 year old female with recent lung biopsy, hemoptysis, suspected urinary infection, on antibiotics, fatigue, increasing headache. History of breast cancer. EXAM: CT LUMBAR SPINE WITH CONTRAST TECHNIQUE: Technique: Multiplanar CT images of the lumbar spine were reconstructed from contemporary CT of the Abdomen and Pelvis. RADIATION DOSE REDUCTION: This exam was performed according to the departmental dose-optimization program which includes automated exposure control, adjustment of the mA and/or kV according to patient size and/or use of iterative reconstruction technique. CONTRAST:  No additional COMPARISON:  CTA Chest,, CT abdomen, and Pelvis today reported separately. CT Abdomen and Pelvis 11/20/2011. FINDINGS: Segmentation: Transitional anatomy, partially sacralized L5 level. Partially this digital L5-S1 disc space. Correlation with radiographs is recommended prior to any operative intervention. Alignment: Stable lordosis since 2013. Minimal scoliosis. No spondylolisthesis. Vertebrae: Maintained vertebral height. Bone mineralization appears stable since 2013. Lumbar vertebrae appear intact. Visible sacrum and SI joints intact. No acute or suspicious osseous lesion identified. Paraspinal and other soft  tissues: Abdomen and pelvis reported separately. Negative lumbar paraspinal soft tissues. Disc levels: Mild for age lumbar spine degeneration appears stable since 2013. Capacious spinal canal. IMPRESSION: 1. Transitional anatomy with partially sacralized L5 level, otherwise normal for age CT appearance of the Lumbar Spine. 2. CTA Chest, CT Abdomen and Pelvis today reported separately.  Electronically Signed   By: VEAR Hurst M.D.   On: 04/07/2024 10:29   CT ABDOMEN PELVIS W CONTRAST Result Date: 04/07/2024 CLINICAL DATA:  68 year old female with recent lung biopsy, hemoptysis, suspected urinary infection, on antibiotics, fatigue, increasing headache. History of breast cancer. EXAM: CT ABDOMEN AND PELVIS WITH CONTRAST TECHNIQUE: Multidetector CT imaging of the abdomen and pelvis was performed using the standard protocol following bolus administration of intravenous contrast. RADIATION DOSE REDUCTION: This exam was performed according to the departmental dose-optimization program which includes automated exposure control, adjustment of the mA and/or kV according to patient size and/or use of iterative reconstruction technique. CONTRAST:  100mL OMNIPAQUE  IOHEXOL  350 MG/ML SOLN COMPARISON:  CTA chest and lumbar spine today reported separately. CT Abdomen and Pelvis 11/20/2011. FINDINGS: Lower chest: Stable to CTA reported separately today. Hepatobiliary: Chronic cholecystectomy. Hepatic steatosis is less apparent. No discrete liver lesion. Pancreas: Negative. Spleen: Stable at the upper limits of normal. Adrenals/Urinary Tract: Negative adrenal glands and kidneys. No delayed excretory images. Renal collecting systems and ureters are diminutive. Unremarkable urinary bladder. Chronic pelvic phleboliths. Stomach/Bowel: Increased large bowel retained stool compared to 2013. Mild large bowel redundancy. Normal retrocecal appendix containing gas on series 4, image 68. Nondilated small bowel. Distal esophageal phrenic ampulla, less likely small hiatal hernia. Otherwise negative stomach and duodenum. No pneumoperitoneum, free fluid, mesenteric inflammation. Vascular/Lymphatic: Aortoiliac calcified atherosclerosis. Normal caliber abdominal aorta. Major arterial structures remain patent. Portal venous system is patent. No lymphadenopathy. Reproductive: Chronically absent uterus, diminutive or absent ovaries.  Other: No pelvis free fluid. Musculoskeletal: Lumbar spine reported separately today. Otherwise no acute or suspicious osseous lesion identified. IMPRESSION: 1. No acute or inflammatory process identified in the abdomen or pelvis. Aortic Atherosclerosis (ICD10-I70.0). 2. Abnormal lung bases, see Chest CTA today reported separately. 3. Lumbar spine reported separately today. Electronically Signed   By: VEAR Hurst M.D.   On: 04/07/2024 10:27   CT Angio Chest PE W/Cm &/Or Wo Cm Result Date: 04/07/2024 CLINICAL DATA:  68 year old female with recent lung biopsy, hemoptysis, suspected urinary infection, on antibiotics, fatigue, increasing headache. History of breast cancer. EXAM: CT ANGIOGRAPHY CHEST WITH CONTRAST TECHNIQUE: Multidetector CT imaging of the chest was performed using the standard protocol during bolus administration of intravenous contrast. Multiplanar CT image reconstructions and MIPs were obtained to evaluate the vascular anatomy. RADIATION DOSE REDUCTION: This exam was performed according to the departmental dose-optimization program which includes automated exposure control, adjustment of the mA and/or kV according to patient size and/or use of iterative reconstruction technique. CONTRAST:  OMNIPAQUE  IOHEXOL  350 MG/ML SOLN COMPARISON:  Chest CT without contrast 02/08/2024. FINDINGS: Cardiovascular: Good contrast bolus timing in the pulmonary arterial tree. No pulmonary artery filling defect is identified. Calcified coronary artery atherosclerosis (series 11, image 170) redemonstrated. Heart size remains normal. No pericardial effusion. Negative thoracic aorta aside from mild atherosclerosis. Mediastinum/Nodes: Negative for mediastinal mass or lymphadenopathy. Lungs/Pleura: Lower lung volumes compared to July. Major airways remain patent. Chronic anterior left upper lobe architectural distortion and cavitary lesion (series 13, image 57) size and configuration of which not significantly changed from  July, and adhesion to the overlying pleura appears stable. However, extensive new surrounding  peribronchial and airspace opacity ranging from sub solid opacity to new surrounding consolidation. And widespread similar extensive peribronchial sub solid and solid opacity scattered in the right upper lobe, both lower lobes. Comparatively mild involvement in the right middle lobe. No areas of obvious contrast extravasation in the lungs. No pneumothorax or pleural effusion. Upper Abdomen: CT Abdomen and Pelvis reported separately. Musculoskeletal: Stable visualized osseous structures. No acute or suspicious osseous lesion identified. Bilateral mastectomy. Review of the MIP images confirms the above findings. IMPRESSION: 1. Negative for acute pulmonary embolus. And no obvious contrast extravasation into the lungs. 2. Chronic left upper lobe architectural distortion and cavitary lesion with extensive new surrounding and widespread bilateral pulmonary airspace disease with areas of early consolidation. No associated pleural effusion. Major airways remain patent. Broad differential considerations including bilateral alveolar hemorrhage, vasculitis, pneumonia, drug reaction or other non-infectious inflammation. 3. CT Abdomen and Pelvis reported separately. 4. Calcified coronary artery,  Aortic Atherosclerosis (ICD10-I70.0). Electronically Signed   By: VEAR Hurst M.D.   On: 04/07/2024 10:23   CT Head Wo Contrast Result Date: 04/07/2024 CLINICAL DATA:  68 year old female with recent lung biopsy, hemoptysis, suspected urinary infection, on antibiotics, fatigue, increasing headache. EXAM: CT HEAD WITHOUT CONTRAST TECHNIQUE: Contiguous axial images were obtained from the base of the skull through the vertex without intravenous contrast. RADIATION DOSE REDUCTION: This exam was performed according to the departmental dose-optimization program which includes automated exposure control, adjustment of the mA and/or kV according to  patient size and/or use of iterative reconstruction technique. COMPARISON:  None Available. FINDINGS: Brain: Cerebral volume is within normal limits for age. No midline shift, ventriculomegaly, mass effect, evidence of mass lesion, intracranial hemorrhage or evidence of cortically based acute infarction. Gray-white matter differentiation is within normal limits throughout the brain. Vascular: Calcified atherosclerosis at the skull base. No suspicious intracranial vascular hyperdensity. Skull: Intact. Small and circumscribed left parietal bone lucency is likely benign on series 5, image 33. No acute osseous abnormality identified. Sinuses/Orbits: Visualized paranasal sinuses and mastoids are clear. Other: Negative orbit and scalp soft tissues, postoperative changes to both globes. IMPRESSION: Normal for age noncontrast Head CT. Electronically Signed   By: VEAR Hurst M.D.   On: 04/07/2024 10:15   DG Chest 2 View Result Date: 04/07/2024 EXAM: 2 VIEW(S) XRAY OF THE CHEST 04/07/2024 08:35:32 AM COMPARISON: 03/28/24. CLINICAL HISTORY: Hemoptysis. Pt is coughing up blood and feeling very weak. FINDINGS: LUNGS AND PLEURA: Redemonstrated left upper lobe cavitary lung mass. Interval development of diffuse interstitial opacities with multifocal airspace densities within the right lung. No pleural fluid or pneumothorax identified. HEART AND MEDIASTINUM: No acute abnormality of the cardiac and mediastinal silhouettes. BONES AND SOFT TISSUES: Surgical clips noted in the left axilla. No acute osseous abnormality. IMPRESSION: 1. Interval development of diffuse interstitial opacities with multifocal airspace densities within the right lung. Differential considerations include diffuse pulmonary edema versus multifocal infection. 2. Redemonstrated left upper lobe cavitary lung mass. Electronically signed by: Waddell Calk MD 04/07/2024 08:43 AM EDT RP Workstation: [id number]   DG CHEST PORT 1 VIEW Result Date: 03/28/2024 CLINICAL  DATA:  Lung mass, status post bronchoscopy EXAM: PORTABLE CHEST 1 VIEW COMPARISON:  02/08/2024 FINDINGS: Cavitary left upper lobe lesion measuring up to 3.7 cm in long axis noted. No pneumothorax. Hazy reticular densities along the right lung base. Heart size within normal limits. Left axillary clips noted. Bilateral mastectomies. IMPRESSION: 1. Cavitary left upper lobe lesion measuring up to 3.7 cm in long axis. 2. Hazy reticular densities along  the right lung base. 3. No pneumothorax. Electronically Signed   By: Ryan Salvage M.D.   On: 03/28/2024 16:12   DG C-ARM BRONCHOSCOPY Result Date: 03/28/2024 C-ARM BRONCHOSCOPY: Fluoroscopy was utilized by the requesting physician.  No radiographic interpretation.    DISCHARGE EXAMINATION: Vitals:   04/09/24 1222 04/09/24 1428 04/09/24 2124 04/10/24 0530  BP: (!) 179/102 (!) 163/85 (!) 151/69 (!) 174/73  Pulse: 79 78 73 84  Resp: 12  20 18   Temp: (!) 97.5 F (36.4 C)  99.2 F (37.3 C) 98.3 F (36.8 C)  TempSrc:   Oral Oral  SpO2: 97%  93% 95%  Weight:      Height:       General appearance: Awake alert.  In no distress Resp: Normal effort at rest.  Improved aeration bilaterally.  Few crackles at the bases. Cardio: S1-S2 is normal regular.  No S3-S4.  No rubs murmurs or bruit GI: Abdomen is soft.  Nontender nondistended.  Bowel sounds are present normal.  No masses organomegaly   DISPOSITION: Home with husband  Discharge Instructions     Call MD for:  difficulty breathing, headache or visual disturbances   Complete by: As directed    Call MD for:  extreme fatigue   Complete by: As directed    Call MD for:  persistant dizziness or light-headedness   Complete by: As directed    Call MD for:  persistant nausea and vomiting   Complete by: As directed    Call MD for:  severe uncontrolled pain   Complete by: As directed    Call MD for:  temperature >100.4   Complete by: As directed    Diet - low sodium heart healthy   Complete by: As  directed    Discharge instructions   Complete by: As directed    Please take your medications as prescribed.  Please be sure to follow-up with your primary care provider.  Take probiotics as discussed.  You were cared for by a hospitalist during your hospital stay. If you have any questions about your discharge medications or the care you received while you were in the hospital after you are discharged, you can call the unit and asked to speak with the hospitalist on call if the hospitalist that took care of you is not available. Once you are discharged, your primary care physician will handle any further medical issues. Please note that NO REFILLS for any discharge medications will be authorized once you are discharged, as it is imperative that you return to your primary care physician (or establish a relationship with a primary care physician if you do not have one) for your aftercare needs so that they can reassess your need for medications and monitor your lab values. If you do not have a primary care physician, you can call [phone] for a physician referral.   Increase activity slowly   Complete by: As directed          Allergies as of 04/10/2024       Reactions   Morphine  And Codeine Hives, Itching   All over the body   Omeprazole Magnesium  Nausea Only   Gabapentin Other (See Comments)   Unable to sleep   Prilosec [omeprazole] Nausea Only   Tamoxifen Citrate Other (See Comments)   Blood clot    Latex Rash   Only where touched        Medication List     TAKE these medications    alendronate 70 MG  tablet Commonly known as: FOSAMAX Take 70 mg by mouth once a week.   amoxicillin -clavulanate 875-125 MG tablet Commonly known as: AUGMENTIN  Take 1 tablet by mouth every 12 (twelve) hours for 7 days. Notes to patient: Take with food   ascorbic acid 500 MG tablet Commonly known as: VITAMIN C Take 500 mg by mouth daily.   atorvastatin  10 MG tablet Commonly known as:  LIPITOR Take 10 mg by mouth daily.   calcium  carbonate 500 MG chewable tablet Commonly known as: TUMS - dosed in mg elemental calcium  Chew 1 tablet by mouth as needed. Reported on 08/06/2015   cholecalciferol 25 MCG (1000 UNIT) tablet Commonly known as: VITAMIN D3 Take 1 tablet (1,000 Units total) by mouth daily.   ciprofloxacin  500 MG tablet Commonly known as: Cipro  Take 1.5 tablets (750 mg total) by mouth 2 (two) times daily for 4 days. What changed: how much to take   famotidine  20 MG tablet Commonly known as: PEPCID  Take 20 mg by mouth daily. What changed: when to take this   fluconazole  100 MG tablet Commonly known as: Diflucan  Take 1 tablet if you develop yeast infection on antibiotics   GOODY HEADACHE PO Take 1 packet by mouth daily as needed (pain).   lisinopril -hydrochlorothiazide  20-12.5 MG tablet Commonly known as: ZESTORETIC  Take 1 tablet by mouth daily.   Potassium 99 MG Tabs Take daily by mouth.           TOTAL DISCHARGE TIME: 35 minutes  Brandi Bates [employer] on [email or web address]  04/11/2024, 10:51 AM

## 2024-04-11 ENCOUNTER — Telehealth: Payer: Self-pay | Admitting: Neurology

## 2024-04-11 NOTE — Telephone Encounter (Signed)
 Patient rescheduled appointment due to going through too much right now.

## 2024-04-18 DIAGNOSIS — J189 Pneumonia, unspecified organism: Secondary | ICD-10-CM | POA: Diagnosis not present

## 2024-04-18 DIAGNOSIS — E876 Hypokalemia: Secondary | ICD-10-CM | POA: Diagnosis not present

## 2024-04-18 DIAGNOSIS — Z09 Encounter for follow-up examination after completed treatment for conditions other than malignant neoplasm: Secondary | ICD-10-CM | POA: Diagnosis not present

## 2024-04-21 LAB — ACID FAST CULTURE WITH REFLEXED SENSITIVITIES (MYCOBACTERIA): Acid Fast Culture: POSITIVE — AB

## 2024-04-21 LAB — ACID FAST ID BY PCR AND SUSCEPTIBILITIES
M Tuberculosis Complex: NEGATIVE
M avium complex: POSITIVE — AB

## 2024-04-24 ENCOUNTER — Ambulatory Visit: Admitting: Neurology

## 2024-04-25 DIAGNOSIS — E782 Mixed hyperlipidemia: Secondary | ICD-10-CM | POA: Diagnosis not present

## 2024-04-25 DIAGNOSIS — Z853 Personal history of malignant neoplasm of breast: Secondary | ICD-10-CM | POA: Diagnosis not present

## 2024-04-25 DIAGNOSIS — I10 Essential (primary) hypertension: Secondary | ICD-10-CM | POA: Diagnosis not present

## 2024-04-26 LAB — FUNGUS CULTURE WITH STAIN

## 2024-04-26 LAB — FUNGUS CULTURE RESULT

## 2024-04-26 LAB — FUNGAL ORGANISM REFLEX

## 2024-04-27 DIAGNOSIS — I10 Essential (primary) hypertension: Secondary | ICD-10-CM | POA: Diagnosis not present

## 2024-04-28 LAB — MAC SUSCEPTIBILITY BROTH
Amikacin: 8
Ciprofloxacin: >8
Clarithromycin: 2
Doxycycline: >8
Minocycline: >8
Moxifloxacin: 4
Rifabutin: 1
Rifampin: 4
Streptomycin: 32

## 2024-04-28 LAB — ACID FAST ID BY PCR AND SUSCEPTIBILITIES
M Tuberculosis Complex: NEGATIVE
M avium complex: POSITIVE — AB

## 2024-04-28 LAB — ACID FAST CULTURE WITH REFLEXED SENSITIVITIES (MYCOBACTERIA): Acid Fast Culture: POSITIVE — AB

## 2024-05-09 ENCOUNTER — Ambulatory Visit: Payer: Self-pay

## 2024-05-09 ENCOUNTER — Other Ambulatory Visit: Payer: Self-pay

## 2024-05-09 ENCOUNTER — Ambulatory Visit: Admitting: Internal Medicine

## 2024-05-09 VITALS — BP 127/80 | HR 80 | Temp 98.4°F | Resp 16

## 2024-05-09 DIAGNOSIS — J471 Bronchiectasis with (acute) exacerbation: Secondary | ICD-10-CM

## 2024-05-09 DIAGNOSIS — Z2239 Carrier of other specified bacterial diseases: Secondary | ICD-10-CM

## 2024-05-09 DIAGNOSIS — A319 Mycobacterial infection, unspecified: Secondary | ICD-10-CM

## 2024-05-09 NOTE — Patient Instructions (Signed)
 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

## 2024-05-09 NOTE — Progress Notes (Signed)
 [facility]  Patient Active Problem List   Diagnosis Date Noted   Hemoptysis 04/07/2024   Bronchiectasis with (acute) exacerbation (HCC) 04/07/2024   Cavitary lesion of lung 03/20/2024   Hypokalemia 03/20/2013   Anxiety 03/20/2013   Malignant neoplasm of upper-outer quadrant of right breast in female, estrogen receptor positive (HCC) 12/02/2011   Pulmonary thromboembolism (HCC)    Pulmonary embolism (HCC) 11/25/2011   History of breast cancer 11/21/2011   Hepatic steatosis 11/21/2011   Constipation 11/21/2011   Overweight (BMI 25.0-29.9) 11/21/2011   Acute pulmonary embolism (HCC) 11/20/2011   Community acquired pneumonia 11/20/2011   Leukocytosis 11/20/2011   Normocytic anemia 11/20/2011   History of radiation therapy       Subjective:    Patient ID: Brandi Bates, female    DOB: [birth year], 68 y.o.   MRN: [id number]  Chief Complaint  Patient presents with   Hospitalization Follow-up    Hemoptysis/concern for pneumonia/MAC/Pseudomonas infection - patient reports getting short of breath every once in awhile.     HPI:  Brandi Bates is a 68 y.o. female here for f/u hospital admission bronchiectasis exacerbation/hemoptysis   Patient said she is still weaker than baseline and not back to baseline functional status She finished cipro /augmentin  2 weeks prior to this follow up visit 05/09/24  She has no n/v/diarrhea/rash  She is back to her baseline dry intermittent cough  At baseline, she could walk flat ground for a mile, she [place], every day at least 6 steps of stairs, indoor chores (laundry). This was the weekend before August 2025   No fever, chill, nightsweat      Allergies  Allergen Reactions   Morphine  And Codeine Hives and Itching    All over the body   Omeprazole Magnesium  Nausea Only   Gabapentin Other (See Comments)    Unable to sleep   Prilosec [Omeprazole] Nausea Only   Tamoxifen Citrate Other  (See Comments)    Blood clot    Latex Rash    Only where touched      Outpatient Medications Prior to Visit  Medication Sig Dispense Refill   alendronate (FOSAMAX) 70 MG tablet Take 70 mg by mouth once a week.     Aspirin-Acetaminophen -Caffeine (GOODY HEADACHE PO) Take 1 packet by mouth daily as needed (pain).     atorvastatin  (LIPITOR) 10 MG tablet Take 10 mg by mouth daily.     calcium  carbonate (TUMS - DOSED IN MG ELEMENTAL CALCIUM ) 500 MG chewable tablet Chew 1 tablet by mouth as needed. Reported on 08/06/2015     cholecalciferol (VITAMIN D3) 25 MCG (1000 UT) tablet Take 1 tablet (1,000 Units total) by mouth daily.     famotidine  (PEPCID ) 20 MG tablet Take 20 mg by mouth daily.     lisinopril -hydrochlorothiazide  (ZESTORETIC ) 20-12.5 MG tablet Take 1 tablet by mouth daily.     Potassium 99 MG TABS Take daily by mouth.     vitamin C (ASCORBIC ACID) 500 MG tablet Take 500 mg by mouth daily.     fluconazole  (DIFLUCAN ) 100 MG tablet Take 1 tablet if you develop yeast infection on antibiotics (Patient not taking: Reported on 05/09/2024) 2 tablet 0   No facility-administered medications prior to visit.     Social History   Socioeconomic History   Marital status: Married    Spouse name: Not on file   Number of children: Not on file   Years  of education: Not on file   Highest education level: Not on file  Occupational History   Not on file  Tobacco Use   Smoking status: Former    Current packs/day: 0.00    Average packs/day: 2.0 packs/day for 28.0 years (56.0 ttl pk-yrs)    Types: Cigarettes    Start date: 05/31/1969    Quit date: 05/31/1997    Years since quitting: 26.9   Smokeless tobacco: Never  Vaping Use   Vaping status: Never Used  Substance and Sexual Activity   Alcohol use: No   Drug use: No   Sexual activity: Never    Comment: P1, 1st pregnancy age 42, no HRT  Other Topics Concern   Not on file  Social History Narrative   Not on file   Social Drivers of Health    Financial Resource Strain: Not on file  Food Insecurity: No Food Insecurity (04/08/2024)   Hunger Vital Sign    Worried About Running Out of Food in the Last Year: Never true    Ran Out of Food in the Last Year: Never true  Transportation Needs: No Transportation Needs (04/08/2024)   PRAPARE - Administrator, Civil Service (Medical): No    Lack of Transportation (Non-Medical): No  Physical Activity: Not on file  Stress: Not on file  Social Connections: Patient Declined (04/07/2024)   Social Connection and Isolation Panel    Frequency of Communication with Friends and Family: Patient declined    Frequency of Social Gatherings with Friends and Family: Patient declined    Attends Religious Services: Patient declined    Database administrator or Organizations: Patient declined    Attends Banker Meetings: Patient declined    Marital Status: Patient declined  Intimate Partner Violence: Not At Risk (04/08/2024)   Humiliation, Afraid, Rape, and Kick questionnaire    Fear of Current or Ex-Partner: No    Emotionally Abused: No    Physically Abused: No    Sexually Abused: No      Review of Systems     Objective:    BP 127/80   Pulse 80   Temp 98.4 F (36.9 C) (Temporal)   Resp 16   SpO2 96%  Nursing note and vital signs reviewed.  Physical Exam     General/constitutional: no distress, pleasant HEENT: Normocephalic, PER, Conj Clear, EOMI, Oropharynx clear Neck supple CV: rrr no mrg Lungs: clear to auscultation, normal respiratory effort Abd: Soft, Nontender Ext: no edema Skin: No Rash Neuro: nonfocal MSK: no peripheral joint swelling/tenderness/warmth; back spines nontender   Labs: Lab Results  Component Value Date   WBC 9.8 04/09/2024   HGB 11.0 (L) 04/09/2024   HCT 36.6 04/09/2024   MCV 92.0 04/09/2024   PLT 287 04/09/2024   Last metabolic panel Lab Results  Component Value Date   GLUCOSE 119 (H) 04/09/2024   NA 137 04/09/2024   K  3.9 04/09/2024   CL 104 04/09/2024   CO2 20 (L) 04/09/2024   BUN 12 04/09/2024   CREATININE 0.61 04/09/2024   GFRNONAA >60 04/09/2024   CALCIUM  9.5 04/09/2024   PHOS 3.1 11/20/2011   PROT 6.8 04/09/2024   ALBUMIN 3.5 04/09/2024   BILITOT 0.6 04/09/2024   ALKPHOS 68 04/09/2024   AST 21 04/09/2024   ALT 35 04/09/2024   ANIONGAP 14 04/09/2024      Micro:  Serology:  Imaging: Reviewed  04/07/24 cta chest 1. Negative for acute pulmonary embolus. And no obvious  contrast extravasation into the lungs. 2. Chronic left upper lobe architectural distortion and cavitary lesion with extensive new surrounding and widespread bilateral pulmonary airspace disease with areas of early consolidation. No associated pleural effusion. Major airways remain patent. Broad differential considerations including bilateral alveolar hemorrhage, vasculitis, pneumonia, drug reaction or other non-infectious inflammation.   3. CT Abdomen and Pelvis reported separately.  Assessment & Plan:   Problem List Items Addressed This Visit     Bronchiectasis with (acute) exacerbation (HCC) - Primary   Other Visit Diagnoses       Mycobacterium avium complex colonization             No orders of the defined types were placed in this encounter.    Assessment: 68 yo female with bronchiectasis and left upper lobe cavitary process followed by onc, admitted 9/12 for an episode hemoptysis, now with repeat chest ct showing increased opacity around left upper lobe   9/2 recent bronch cx (both left upper lobe and right lobe) Few pseudomonas sensitive to cefepime /ceftazi, cipro , meropenem, piptazo, tobra Positive AFB culture left upper lobe, still cooking   Discussed with pulm dr Theophilus and it's a little far out to attribute the opacity to bronchoscopy     No sign of sepsis   question indolent process ?mac; no suspicion for tb. Pseudomonas colonization sometimes does contribute to bronchiectasis  exacerbation   Would treat with a week as cap/bronchiectasis exacerbation     On a note, it appears she was taking cipro  outpatient starting 9/6 for uti (this should have covered the pseudomonas, so I query if supportive care and making sure no active bleeding lesion that needs to be embolized would be sufficient. But again the increased opacity is not explained by this long out from the bronchosocpy procedure   ------------ 05/09/24 id clinic assessment Patient finished 3 week course abx treatment cipro /augmentin  2 weeks prior to this visit Overall improving but not close to baseline  She likely had experienced a pneumonia/bronchiectasis exacerbation  9/2 bronch showed mac as well... at this time will monitor how she does and will not discuss treatment unless she continue to show decline in symptoms, imaging, and persistent culture over the next year   Discuss pathogenis of mac lung, poor efficacy of treatment high relapse, unfavorable tolerability of antibiotics as reason we want to often adopt a watch and wait approach   See me in 3 months. Will plan ct chest repeat in 3-6 months Advise her to follow with pulm for bronchiectasis care as well; consider [facility]     Follow-up: Return in about 4 months (around 09/09/2024).      Brandi ONEIDA Passer, MD [facility] 05/09/2024, 4:14 PM

## 2024-05-09 NOTE — Addendum Note (Signed)
 Addended byBETHA JESSICA BOUCHARD S on: 05/09/2024 04:33 PM   Modules accepted: Orders

## 2024-05-09 NOTE — Progress Notes (Signed)
 ATCx1, LVMTCB. Referral placed.

## 2024-05-10 NOTE — Progress Notes (Signed)
 Called and spoke to pt - advised of recommendations per Dr. Zaida. Pt states she has already seen infectious disease on 05/09/24. NFN

## 2024-05-12 ENCOUNTER — Telehealth: Payer: Self-pay

## 2024-05-12 NOTE — Telephone Encounter (Addendum)
 Copied from CRM [id number]. Topic: General - Other >> May 09, 2024  4:45 PM Joesph PARAS wrote: Reason for CRM: Patient just saw Infectious Disease Provider. Please return call to patient is anything further was needed.  Called patient.  Patient saw Dr. Overton at Infectious Disease.  Will follow up with Dr. Overton in 3 months and have a CT chest repeat in 3-6 months.    Patient wants to make sure Dr. Zaida did not want patient to make another follow up with him or any further testing.  Dr. Zaida, please advise.

## 2024-05-15 NOTE — Telephone Encounter (Signed)
 Called patient.  Informed patient to follow up with Dr. Donzetta after repeat CT scan in 3-6 months.  Patient verbalized understanding.

## 2024-05-21 ENCOUNTER — Emergency Department (HOSPITAL_COMMUNITY)

## 2024-05-21 ENCOUNTER — Other Ambulatory Visit: Payer: Self-pay

## 2024-05-21 ENCOUNTER — Inpatient Hospital Stay (HOSPITAL_COMMUNITY)
Admission: EM | Admit: 2024-05-21 | Discharge: 2024-05-23 | DRG: 190 | Disposition: A | Discharge status: Home / Self Care | Attending: Internal Medicine | Admitting: Internal Medicine

## 2024-05-21 ENCOUNTER — Encounter (HOSPITAL_COMMUNITY): Payer: Self-pay | Admitting: Emergency Medicine

## 2024-05-21 DIAGNOSIS — J189 Pneumonia, unspecified organism: Principal | ICD-10-CM | POA: Diagnosis present

## 2024-05-21 DIAGNOSIS — Z7951 Long term (current) use of inhaled steroids: Secondary | ICD-10-CM

## 2024-05-21 DIAGNOSIS — Z800 Family history of malignant neoplasm of digestive organs: Secondary | ICD-10-CM

## 2024-05-21 DIAGNOSIS — Z9221 Personal history of antineoplastic chemotherapy: Secondary | ICD-10-CM

## 2024-05-21 DIAGNOSIS — I10 Essential (primary) hypertension: Secondary | ICD-10-CM | POA: Diagnosis not present

## 2024-05-21 DIAGNOSIS — F411 Generalized anxiety disorder: Secondary | ICD-10-CM | POA: Diagnosis not present

## 2024-05-21 DIAGNOSIS — J471 Bronchiectasis with (acute) exacerbation: Principal | ICD-10-CM | POA: Diagnosis present

## 2024-05-21 DIAGNOSIS — Y95 Nosocomial condition: Secondary | ICD-10-CM | POA: Diagnosis present

## 2024-05-21 DIAGNOSIS — Z8249 Family history of ischemic heart disease and other diseases of the circulatory system: Secondary | ICD-10-CM

## 2024-05-21 DIAGNOSIS — Z79899 Other long term (current) drug therapy: Secondary | ICD-10-CM | POA: Diagnosis not present

## 2024-05-21 DIAGNOSIS — J449 Chronic obstructive pulmonary disease, unspecified: Secondary | ICD-10-CM | POA: Diagnosis not present

## 2024-05-21 DIAGNOSIS — B9789 Other viral agents as the cause of diseases classified elsewhere: Secondary | ICD-10-CM | POA: Diagnosis present

## 2024-05-21 DIAGNOSIS — Z923 Personal history of irradiation: Secondary | ICD-10-CM

## 2024-05-21 DIAGNOSIS — Z91040 Latex allergy status: Secondary | ICD-10-CM

## 2024-05-21 DIAGNOSIS — A310 Pulmonary mycobacterial infection: Secondary | ICD-10-CM | POA: Diagnosis not present

## 2024-05-21 DIAGNOSIS — Z881 Allergy status to other antibiotic agents status: Secondary | ICD-10-CM

## 2024-05-21 DIAGNOSIS — B965 Pseudomonas (aeruginosa) (mallei) (pseudomallei) as the cause of diseases classified elsewhere: Secondary | ICD-10-CM | POA: Diagnosis not present

## 2024-05-21 DIAGNOSIS — B348 Other viral infections of unspecified site: Secondary | ICD-10-CM

## 2024-05-21 DIAGNOSIS — Z885 Allergy status to narcotic agent status: Secondary | ICD-10-CM

## 2024-05-21 DIAGNOSIS — Z8701 Personal history of pneumonia (recurrent): Secondary | ICD-10-CM

## 2024-05-21 DIAGNOSIS — Z7983 Long term (current) use of bisphosphonates: Secondary | ICD-10-CM

## 2024-05-21 DIAGNOSIS — R059 Cough, unspecified: Secondary | ICD-10-CM | POA: Diagnosis not present

## 2024-05-21 DIAGNOSIS — J470 Bronchiectasis with acute lower respiratory infection: Secondary | ICD-10-CM | POA: Diagnosis present

## 2024-05-21 DIAGNOSIS — Z803 Family history of malignant neoplasm of breast: Secondary | ICD-10-CM

## 2024-05-21 DIAGNOSIS — E785 Hyperlipidemia, unspecified: Secondary | ICD-10-CM | POA: Diagnosis not present

## 2024-05-21 DIAGNOSIS — E7849 Other hyperlipidemia: Secondary | ICD-10-CM

## 2024-05-21 DIAGNOSIS — I7300 Raynaud's syndrome without gangrene: Secondary | ICD-10-CM | POA: Diagnosis not present

## 2024-05-21 DIAGNOSIS — J129 Viral pneumonia, unspecified: Secondary | ICD-10-CM | POA: Diagnosis present

## 2024-05-21 DIAGNOSIS — R911 Solitary pulmonary nodule: Secondary | ICD-10-CM | POA: Diagnosis not present

## 2024-05-21 DIAGNOSIS — Z87891 Personal history of nicotine dependence: Secondary | ICD-10-CM | POA: Diagnosis not present

## 2024-05-21 DIAGNOSIS — Z808 Family history of malignant neoplasm of other organs or systems: Secondary | ICD-10-CM

## 2024-05-21 DIAGNOSIS — J209 Acute bronchitis, unspecified: Secondary | ICD-10-CM | POA: Diagnosis not present

## 2024-05-21 DIAGNOSIS — Z87898 Personal history of other specified conditions: Secondary | ICD-10-CM

## 2024-05-21 DIAGNOSIS — Z853 Personal history of malignant neoplasm of breast: Secondary | ICD-10-CM | POA: Diagnosis not present

## 2024-05-21 DIAGNOSIS — K219 Gastro-esophageal reflux disease without esophagitis: Secondary | ICD-10-CM | POA: Diagnosis not present

## 2024-05-21 DIAGNOSIS — R0602 Shortness of breath: Secondary | ICD-10-CM | POA: Diagnosis not present

## 2024-05-21 DIAGNOSIS — R918 Other nonspecific abnormal finding of lung field: Secondary | ICD-10-CM | POA: Diagnosis not present

## 2024-05-21 DIAGNOSIS — R7989 Other specified abnormal findings of blood chemistry: Secondary | ICD-10-CM | POA: Diagnosis present

## 2024-05-21 DIAGNOSIS — R0981 Nasal congestion: Secondary | ICD-10-CM | POA: Diagnosis not present

## 2024-05-21 DIAGNOSIS — Z9013 Acquired absence of bilateral breasts and nipples: Secondary | ICD-10-CM

## 2024-05-21 DIAGNOSIS — J188 Other pneumonia, unspecified organism: Secondary | ICD-10-CM | POA: Diagnosis not present

## 2024-05-21 DIAGNOSIS — Z888 Allergy status to other drugs, medicaments and biological substances status: Secondary | ICD-10-CM | POA: Diagnosis not present

## 2024-05-21 LAB — BASIC METABOLIC PANEL WITH GFR
Anion gap: 14 (ref 5–15)
BUN: 22 mg/dL (ref 8–23)
CO2: 22 mmol/L (ref 22–32)
Calcium: 10.3 mg/dL (ref 8.9–10.3)
Chloride: 105 mmol/L (ref 98–111)
Creatinine, Ser: 0.85 mg/dL (ref 0.44–1.00)
GFR, Estimated: >60 mL/min (ref >60)
Glucose, Bld: 103 mg/dL — ABNORMAL HIGH (ref 70–99)
Potassium: 4 mmol/L (ref 3.5–5.1)
Sodium: 141 mmol/L (ref 135–145)

## 2024-05-21 LAB — D-DIMER, QUANTITATIVE: D-Dimer, Quant: 0.93 ug{FEU}/mL — ABNORMAL HIGH (ref 0.00–0.50)

## 2024-05-21 LAB — CBC
HCT: 41.2 % (ref 36.0–46.0)
Hemoglobin: 12.4 g/dL (ref 12.0–15.0)
MCH: 26.6 pg (ref 26.0–34.0)
MCHC: 30.1 g/dL (ref 30.0–36.0)
MCV: 88.4 fL (ref 80.0–100.0)
Platelets: 258 K/uL (ref 150–400)
RBC: 4.66 MIL/uL (ref 3.87–5.11)
RDW: 14.9 % (ref 11.5–15.5)
WBC: 6.3 K/uL (ref 4.0–10.5)
nRBC: 0 % (ref 0.0–0.2)

## 2024-05-21 LAB — I-STAT CG4 LACTIC ACID, ED: Lactic Acid, Venous: 1.5 mmol/L (ref 0.5–1.9)

## 2024-05-21 LAB — PRO BRAIN NATRIURETIC PEPTIDE: Pro Brain Natriuretic Peptide: 103 pg/mL (ref <300.0)

## 2024-05-21 MED ORDER — ACETYLCYSTEINE 20 % IN SOLN: 3.0000 mL | Freq: Two times a day (BID) | RESPIRATORY_TRACT | Status: DC

## 2024-05-21 MED ORDER — SODIUM CHLORIDE 0.9% FLUSH
3.0000 mL | Freq: Two times a day (BID) | INTRAVENOUS | Status: DC
  Administered 2024-05-22 – 2024-05-23 (×4): 3 mL via INTRAVENOUS

## 2024-05-21 MED ORDER — VITAMIN C 500 MG PO TABS
500.0000 mg | ORAL_TABLET | Freq: Every day | ORAL | Status: DC
Start: 2024-05-21 — End: 2024-05-23
  Administered 2024-05-21 – 2024-05-23 (×3): 500 mg via ORAL
  Filled 2024-05-21 (×3): qty 1

## 2024-05-21 MED ORDER — METHYLPREDNISOLONE SODIUM SUCC 125 MG IJ SOLR
125.0000 mg | Freq: Once | INTRAMUSCULAR | Status: AC
  Administered 2024-05-21: 125 mg via INTRAVENOUS
  Filled 2024-05-21: qty 2

## 2024-05-21 MED ORDER — IOHEXOL 350 MG/ML SOLN
75.0000 mL | Freq: Once | INTRAVENOUS | Status: AC | PRN
  Administered 2024-05-21: 75 mL via INTRAVENOUS

## 2024-05-21 MED ORDER — VITAMIN D 25 MCG (1000 UNIT) PO TABS
1000.0000 [IU] | ORAL_TABLET | Freq: Every day | ORAL | Status: DC
  Administered 2024-05-22 – 2024-05-23 (×2): 1000 [IU] via ORAL
  Filled 2024-05-21 (×2): qty 1

## 2024-05-21 MED ORDER — VANCOMYCIN HCL 1250 MG/250ML IV SOLN: 1250.0000 mg | INTRAVENOUS | Status: DC

## 2024-05-21 MED ORDER — SODIUM CHLORIDE 0.9 % IV SOLN
2.0000 g | Freq: Once | INTRAVENOUS | Status: AC
  Administered 2024-05-21: 2 g via INTRAVENOUS
  Filled 2024-05-21: qty 12.5

## 2024-05-21 MED ORDER — ATORVASTATIN CALCIUM 10 MG PO TABS
10.0000 mg | ORAL_TABLET | Freq: Every day | ORAL | Status: DC
  Administered 2024-05-22 – 2024-05-23 (×2): 10 mg via ORAL
  Filled 2024-05-21 (×2): qty 1

## 2024-05-21 MED ORDER — METHYLPREDNISOLONE SODIUM SUCC 40 MG IJ SOLR
40.0000 mg | Freq: Two times a day (BID) | INTRAMUSCULAR | Status: DC
Start: 2024-05-22 — End: 2024-05-26
  Administered 2024-05-22 – 2024-05-23 (×3): 40 mg via INTRAVENOUS
  Filled 2024-05-21 (×3): qty 1

## 2024-05-21 MED ORDER — VANCOMYCIN HCL 1750 MG/350ML IV SOLN
1750.0000 mg | Freq: Once | INTRAVENOUS | Status: AC
  Administered 2024-05-21: 1750 mg via INTRAVENOUS
  Filled 2024-05-21: qty 350

## 2024-05-21 MED ORDER — ONDANSETRON HCL 4 MG/2ML IJ SOLN
4.0000 mg | Freq: Once | INTRAMUSCULAR | Status: AC
  Administered 2024-05-21: 4 mg via INTRAVENOUS

## 2024-05-21 MED ORDER — LACTATED RINGERS IV SOLN: INTRAVENOUS | Status: DC

## 2024-05-21 MED ORDER — IPRATROPIUM-ALBUTEROL 0.5-2.5 (3) MG/3ML IN SOLN
3.0000 mL | Freq: Four times a day (QID) | RESPIRATORY_TRACT | Status: DC | PRN
  Administered 2024-05-21: 3 mL via RESPIRATORY_TRACT
  Filled 2024-05-21: qty 3

## 2024-05-21 MED ORDER — BUDESON-GLYCOPYRROL-FORMOTEROL 160-9-4.8 MCG/ACT IN AERO
2.0000 | INHALATION_SPRAY | Freq: Two times a day (BID) | RESPIRATORY_TRACT | Status: DC
  Administered 2024-05-22 – 2024-05-23 (×3): 2 via RESPIRATORY_TRACT
  Filled 2024-05-21: qty 5.9

## 2024-05-21 MED ORDER — SODIUM CHLORIDE 0.9 % IV SOLN
2.0000 g | Freq: Three times a day (TID) | INTRAVENOUS | Status: DC
  Administered 2024-05-22: 2 g via INTRAVENOUS
  Filled 2024-05-21: qty 12.5

## 2024-05-21 MED ORDER — ONDANSETRON HCL 4 MG/2ML IJ SOLN
INTRAMUSCULAR | Status: AC
  Filled 2024-05-21: qty 2

## 2024-05-21 MED ORDER — FAMOTIDINE 20 MG PO TABS
20.0000 mg | ORAL_TABLET | Freq: Every day | ORAL | Status: DC
  Administered 2024-05-21 – 2024-05-23 (×3): 20 mg via ORAL
  Filled 2024-05-21 (×3): qty 1

## 2024-05-21 MED ORDER — SODIUM CHLORIDE 0.9 % IV SOLN: 250.0000 mL | INTRAVENOUS | Status: AC | PRN

## 2024-05-21 MED ORDER — SODIUM CHLORIDE 3 % IN NEBU: 4.0000 mL | INHALATION_SOLUTION | Freq: Every day | RESPIRATORY_TRACT | Status: DC

## 2024-05-21 MED ORDER — SODIUM CHLORIDE 3 % IN NEBU
4.0000 mL | INHALATION_SOLUTION | Freq: Every day | RESPIRATORY_TRACT | Status: DC
  Administered 2024-05-21 – 2024-05-23 (×2): 4 mL via RESPIRATORY_TRACT
  Filled 2024-05-21 (×3): qty 4

## 2024-05-21 MED ORDER — SODIUM CHLORIDE 0.9% FLUSH: 3.0000 mL | INTRAVENOUS | Status: DC | PRN

## 2024-05-21 MED ORDER — LACTATED RINGERS IV BOLUS
1000.0000 mL | Freq: Once | INTRAVENOUS | Status: AC
  Administered 2024-05-21: 1000 mL via INTRAVENOUS

## 2024-05-21 MED ORDER — SODIUM CHLORIDE 0.9% FLUSH: 10.0000 mL | INTRAVENOUS | Status: DC | PRN

## 2024-05-21 MED ORDER — BENZONATATE 100 MG PO CAPS
200.0000 mg | ORAL_CAPSULE | Freq: Three times a day (TID) | ORAL | Status: DC
  Administered 2024-05-21 – 2024-05-23 (×5): 200 mg via ORAL
  Filled 2024-05-21 (×5): qty 2

## 2024-05-21 MED ORDER — PROMETHAZINE HCL 25 MG PO TABS: 12.5000 mg | ORAL_TABLET | Freq: Four times a day (QID) | ORAL | Status: DC | PRN

## 2024-05-21 MED ORDER — VANCOMYCIN HCL IN DEXTROSE 1-5 GM/200ML-% IV SOLN: 1000.0000 mg | Freq: Once | INTRAVENOUS | Status: DC

## 2024-05-21 NOTE — ED Notes (Addendum)
 Unable to draw back blood for cultures from midline

## 2024-05-21 NOTE — ED Notes (Signed)
 Pt IV infiltrated during CT scan. IV removed and ice pack placed on arm

## 2024-05-21 NOTE — Progress Notes (Signed)
 Pharmacy Antibiotic Note  Brandi Bates is a 68 y.o. female admitted on 05/21/2024 with pneumonia. Pharmacy has been consulted for vancomycin  and cefepime  dosing.  Plan: -Vancomycin  1750 mg IV x 1 followed by 1250 mg IV q24h -Cefepime  2 g IV q8h     Temp (24hrs), Avg:98.4 F (36.9 C), Min:97.9 F (36.6 C), Max:98.8 F (37.1 C)  Recent Labs  Lab 05/21/24 1524 05/21/24 1624  WBC 6.3  --   CREATININE 0.85  --   LATICACIDVEN  --  1.5    CrCl cannot be calculated (Unknown ideal weight.).    Allergies  Allergen Reactions   Morphine  And Codeine Hives and Itching    All over the body   Omeprazole Magnesium  Nausea Only   Gabapentin Other (See Comments)    Unable to sleep   Prilosec [Omeprazole] Nausea Only   Tamoxifen Citrate Other (See Comments)    Blood clot    Latex Rash    Only where touched    Antimicrobials this admission: Cefepime  10/26 >> Vancomycin  10/26 >>  Dose adjustments this admission: NA  Microbiology results: 10/26 BCx: pending 10/26 MRSA PCR: pending  Thank you for allowing pharmacy to be a part of this patient's care.  Stefano MARLA Bologna, PharmD, BCPS Clinical Pharmacist 05/21/2024 8:09 PM

## 2024-05-21 NOTE — H&P (Addendum)
 History and Physical    KYUNG MUTO FMW:[id number] DOB: [birth year] DOA: 05/21/2024  PCP: Dayna Motto, DO   Patient coming from: Home   Chief Complaint:  Chief Complaint  Patient presents with   Shortness of Breath   ED TRIAGE note:  Pt reports SHOB x 2 days. Pt states she was sent from UC today and was told she had pneumonia. She received steroid and an antibioti PTA.     HPI:  MANUEL LAWHEAD is a 68 y.o. 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  Patient reported that she was diagnosed with pneumonia and pulmonary embolism 1 month ago.  Reported noting improvement of pneumonia with antibiotics.  However for last few days she is starting having new symptom of cough and shortness of breath that is progressively getting worse and went to urgent care for evaluation, underwent chest x-ray which showed pneumonia and patient was sent to ED for evaluation.  Even though patient stating that she has pulmonary embolism per chart review recently admitted in September 2025 secondary to hemoptysis concern for MAC and Pseudomonas infection.  During that time ruled out PE.  Patient initially started on IV doxycycline  cefepime , hemoptysis resolved and ID recommended Augmentin  and Cipro  on discharge. Per chart review patient has infectious disease clinic visit 10/14 completed 3 weeks course of ciprofloxacin  and 2 weeks course of Augmentin  however still not at baseline.  ID  recommended repeat chest CT scan in 3 to 6 months.   During my evaluation at the bedside patient reported she is having cough initially it  was dry nonproductive in nature with associated shortness of breath.  Denies any wheezing, paroxysmal nocturnal dyspnea or lower extremity swellings.  Denies any fever, chill, nausea, vomiting and diarrhea.  Denies any chest pain and palpitation.  Denies any myalgia, joint pain, rash and neck pain.   Patient reported that she has experienced head tremor and bilateral hand tremor with ciprofloxacin  and requested to avoid Cipro  in the future.  ED Course:  At presentation to ED patient is hemodynamically stable except blood pressure is borderline soft 97/78.  O2 sat 100% room air.  Afebrile. Lab, elevated D-dimer.  Normal lactic acid level.  Normal proBNP.  CBC unremarkable.  BMP unremarkable.   CTA chest no evidence of pulmonary embolism. Multifocal nodular consolidations throughout all lobes of both lungs, slightly less diffuse and more coalescent than on CT last month. Differential considerations are broad. Infection, including atypical organisms, non infectious pneumonitis, vasculitis or drug reaction are considered. Recommend correlation with recent bronchoscopy. 3. Unchanged cavitary lesion in the anterior left upper lobe. 4. Shotty mediastinal and hilar lymph nodes are likely reactive.  Chest x-ray -Decreased bilateral airspace opacities and interstitial prominence. Residual or recurrent pneumonia is not excluded.  In the ED patient received DuoNeb nebulizer, Solu-Medrol, cefepime  and vancomycin . Dr. Garrick already consulted pulmonology recommended to treat for bronchiectasis and broad-spectrum antibiotic.  Pulmonology service will see patient tomorrow.  Hospitalist consulted for further evaluation management of acute exacerbation of bronchiectasis and community-acquired pneumonia.  Significant labs in the ED: Lab Orders         Respiratory (~20 pathogens) panel by PCR  Culture, blood (Routine X 2) w Reflex to ID Panel         Expectorated Sputum Assessment w Gram Stain,  Rflx to Resp Cult         MRSA Next Gen by PCR, Nasal         Basic metabolic panel         CBC         Pro Brain natriuretic peptide         D-dimer, quantitative         Legionella Pneumophila Serogp 1 Ur Ag         Strep pneumoniae urinary antigen         CBC         Comprehensive metabolic panel         Procalcitonin         I-Stat Lactic Acid       Review of Systems:  Review of Systems  Constitutional:  Negative for chills, fever, malaise/fatigue and weight loss.  HENT:  Negative for sinus pain and sore throat.   Respiratory:  Positive for cough, sputum production and shortness of breath. Negative for wheezing and stridor.   Cardiovascular:  Negative for chest pain and palpitations.  Gastrointestinal:  Negative for diarrhea, nausea and vomiting.  Musculoskeletal:  Negative for joint pain, myalgias and neck pain.  Skin:  Negative for rash.  Neurological:  Negative for headaches.  Psychiatric/Behavioral:  The patient is not nervous/anxious.     Past Medical History:  Diagnosis Date   Allergy    Anxiety    Bladder cystocele 11/20/11   low-lying ct result   Breast cancer (HCC) 10/23/10   s/p L mastectomy, chemo/radiation, er/pr +, Her2 -   Breast cancer (HCC) 11/10/11   S/P right mastectomy   Breast wound    Left breast from radiation, skin graft   Chronic low back pain    everyday   Eczema    GERD (gastroesophageal reflux disease)    Hepatic steatosis 11/20/11   severe    History of cancer chemotherapy    memory issues from chemo   History of radiation therapy 04/23/11 thru 06/08/11   L breast   Lymphedema of arm    left   Migraines    Neuromuscular disorder (HCC)    raynauds syndrome    Numbness of feet    Pneumonia April 2013   PONV (postoperative nausea and vomiting)    Pulmonary thromboembolism (HCC) 11/20/11   ct positive acute w/i segmental branches of right lower lobe   Shortness of breath on exertion     Past Surgical History:  Procedure  Laterality Date   ABDOMINAL HYSTERECTOMY     BILATERAL SALPINGOOPHORECTOMY  11/10/11   laparoscopy   BREAST SURGERY Bilateral    mastectomy   CHOLECYSTECTOMY  1999   DIAGNOSTIC LAPAROSCOPY     IRRIGATION AND DEBRIDEMENT ABSCESS  06/16/2011   Procedure: IRRIGATION AND DEBRIDEMENT ABSCESS;  Surgeon: Vicenta DELENA Poli, MD;  Location: [facility];  Service: General;  Laterality: Left;  incision and drainage of left chest wall abcess   MASTECTOMY  11/10/11   right; w/SNB   MASTECTOMY MODIFIED RADICAL  10/23/11   left   MASTECTOMY W/ SENTINEL NODE BIOPSY  11/10/2011   Procedure: MASTECTOMY WITH SENTINEL LYMPH NODE BIOPSY;  Surgeon: Vicenta DELENA Poli, MD;  Location: [facility];  Service: General;  Laterality: Right;   PORT-A-CATH REMOVAL Right 09/26/2014   Procedure: MINOR REMOVAL PORT-A-CATH;  Surgeon: Vicenta  Vernetta, MD;  Location: [facility];  Service: General;  Laterality: Right;   PORTACATH PLACEMENT     right subclavian   TUBAL LIGATION  1979   VIDEO BRONCHOSCOPY Bilateral 03/28/2024   Procedure: BRONCHOSCOPY, WITH FLUOROSCOPY;  Surgeon: Zaida Zola SAILOR, MD;  Location: [facility];  Service: Pulmonary;  Laterality: Bilateral;     reports that she quit smoking about 26 years ago. Her smoking use included cigarettes. She started smoking about 55 years ago. She has a 56 pack-year smoking history. She has never used smokeless tobacco. She reports that she does not drink alcohol and does not use drugs.  Allergies  Allergen Reactions   Ciprofloxacin  Other (See Comments)    Doesn't tolerate medication and gives her uncontrollable tremors of head and hand   Morphine  And Codeine Hives and Itching    All over the body   Omeprazole Magnesium  Nausea Only   Gabapentin Other (See Comments)    Unable to sleep   Prilosec [Omeprazole] Nausea Only   Tamoxifen Citrate Other (See Comments)    Blood clot    Latex Rash    Only where touched    Family History  Problem Relation Age of Onset    Cancer Mother        lung   Hypertension Mother    Cancer Father        lung   Cancer Brother        BRAIN CANCER   Hypertension Brother    Cancer Cousin         2 PATERNAL COUSINS - BREAST CA   Hypertension Sister    Hypertension Maternal Uncle    Hypertension Maternal Grandmother    Colon cancer Other 43       colon METS to liver    Prior to Admission medications   Medication Sig Start Date End Date Taking? Authorizing Provider  alendronate (FOSAMAX) 70 MG tablet Take 70 mg by mouth once a week. 02/11/24   [provider]  Aspirin-Acetaminophen -Caffeine (GOODY HEADACHE PO) Take 1 packet by mouth daily as needed (pain).    [provider]  atorvastatin  (LIPITOR) 10 MG tablet Take 10 mg by mouth daily. 11/16/23   [provider]  calcium  carbonate (TUMS - DOSED IN MG ELEMENTAL CALCIUM ) 500 MG chewable tablet Chew 1 tablet by mouth as needed. Reported on 08/06/2015    [provider]  cholecalciferol (VITAMIN D3) 25 MCG (1000 UT) tablet Take 1 tablet (1,000 Units total) by mouth daily. 06/02/18   Magrinat, Sandria BROCKS, MD  famotidine  (PEPCID ) 20 MG tablet Take 20 mg by mouth daily.    [provider]  fluconazole  (DIFLUCAN ) 100 MG tablet Take 1 tablet if you develop yeast infection on antibiotics Patient not taking: Reported on 05/09/2024 04/10/24   Verdene Purchase, MD  lisinopril -hydrochlorothiazide  (ZESTORETIC ) 20-12.5 MG tablet Take 1 tablet by mouth daily.    [provider]  Potassium 99 MG TABS Take daily by mouth.    [provider]  vitamin C (ASCORBIC ACID) 500 MG tablet Take 500 mg by mouth daily.    [provider]  gabapentin (NEURONTIN) 300 MG capsule daily. 09/17/11 10/02/11  [provider]     Physical Exam: Vitals:   05/21/24 1900 05/21/24 1915 05/21/24 1945 05/21/24 2000  BP: 125/67 97/78 135/75 (!) 116/94  Pulse: 76 87 86 77  Resp: 19 (!) 22 (!) 23 19  Temp:      TempSrc:  SpO2: 95%  99% 94% 96%    Physical Exam Constitutional:      General: She is not in acute distress.    Appearance: She is well-developed. She is not ill-appearing.  HENT:     Mouth/Throat:     Mouth: Mucous membranes are moist.     Pharynx: No oropharyngeal exudate.  Cardiovascular:     Rate and Rhythm: Normal rate and regular rhythm.  Pulmonary:     Effort: Pulmonary effort is normal.     Breath sounds: Normal breath sounds. No decreased breath sounds, wheezing, rhonchi or rales.  Musculoskeletal:     Cervical back: Neck supple.  Lymphadenopathy:     Cervical: No cervical adenopathy.  Skin:    Capillary Refill: Capillary refill takes less than 2 seconds.  Neurological:     Mental Status: She is alert and oriented to person, place, and time.      Labs on Admission: I have personally reviewed following labs and imaging studies  CBC: Recent Labs  Lab 05/21/24 1524  WBC 6.3  HGB 12.4  HCT 41.2  MCV 88.4  PLT 258   Basic Metabolic Panel: Recent Labs  Lab 05/21/24 1524  NA 141  K 4.0  CL 105  CO2 22  GLUCOSE 103*  BUN 22  CREATININE 0.85  CALCIUM  10.3   GFR: CrCl cannot be calculated (Unknown ideal weight.). Liver Function Tests: No results for input(s): AST, ALT, ALKPHOS, BILITOT, PROT, ALBUMIN in the last 168 hours. No results for input(s): LIPASE, AMYLASE in the last 168 hours. No results for input(s): AMMONIA in the last 168 hours. Coagulation Profile: No results for input(s): INR, PROTIME in the last 168 hours. Cardiac Enzymes: No results for input(s): CKTOTAL, CKMB, CKMBINDEX, TROPONINI, TROPONINIHS in the last 168 hours. BNP (last 3 results) No results for input(s): BNP in the last 8760 hours. HbA1C: No results for input(s): HGBA1C in the last 72 hours. CBG: No results for input(s): GLUCAP in the last 168 hours. Lipid Profile: No results for input(s): CHOL, HDL, LDLCALC, TRIG, CHOLHDL, LDLDIRECT in the last  72 hours. Thyroid  Function Tests: No results for input(s): TSH, T4TOTAL, FREET4, T3FREE, THYROIDAB in the last 72 hours. Anemia Panel: No results for input(s): VITAMINB12, FOLATE, FERRITIN, TIBC, IRON, RETICCTPCT in the last 72 hours. Urine analysis:    Component Value Date/Time   COLORURINE YELLOW 04/07/2024 0804   APPEARANCEUR HAZY (A) 04/07/2024 0804   LABSPEC 1.017 04/07/2024 0804   PHURINE 5.0 04/07/2024 0804   GLUCOSEU NEGATIVE 04/07/2024 0804   HGBUR MODERATE (A) 04/07/2024 0804   BILIRUBINUR NEGATIVE 04/07/2024 0804   KETONESUR NEGATIVE 04/07/2024 0804   PROTEINUR NEGATIVE 04/07/2024 0804   UROBILINOGEN 0.2 12/10/2011 1045   NITRITE NEGATIVE 04/07/2024 0804   LEUKOCYTESUR SMALL (A) 04/07/2024 0804    Radiological Exams on Admission: I have personally reviewed images CT Angio Chest PE W/Cm &/Or Wo Cm Result Date: 05/21/2024 CLINICAL DATA:  Provided history: Pulmonary embolism (PE) suspected, low to intermediate prob, positive D-dimer Shortness of breath. EXAM: CT ANGIOGRAPHY CHEST WITH CONTRAST TECHNIQUE: Multidetector CT imaging of the chest was performed using the standard protocol during bolus administration of intravenous contrast. Multiplanar CT image reconstructions and MIPs were obtained to evaluate the vascular anatomy. RADIATION DOSE REDUCTION: This exam was performed according to the departmental dose-optimization program which includes automated exposure control, adjustment of the mA and/or kV according to patient size and/or use of iterative reconstruction technique. CONTRAST:  75mL OMNIPAQUE  IOHEXOL  350 MG/ML SOLN COMPARISON:  Radiograph earlier today. Chest CTA 04/07/2024, CT 02/08/2024 FINDINGS: Cardiovascular: There are no filling defects within the pulmonary arteries to suggest pulmonary embolus. The heart is borderline enlarged. There are coronary artery calcifications. No aortic aneurysm or acute aortic findings. No pericardial effusion.  Mediastinum/Nodes: Shotty mediastinal and hilar lymph nodes. 10 mm right hilar node series 4, image 63 9 mm prevascular node series 4, image 47. Patulous esophagus. Lungs/Pleura: Multifocal nodular consolidations throughout all lobes of both lungs. Slight low less diffuse and more coalescent than on prior exam. The cavitary lesion in the anterior left upper lobe, series 12, image 53, is essentially unchanged with thick-walled lesion abutting the pleura anteriorly. No significant pleural effusion. Upper Abdomen: Cholecystectomy. Abdominal aortic atherosclerosis. No acute upper abdominal findings. Musculoskeletal: No acute osseous findings.  Bilateral mastectomy. Review of the MIP images confirms the above findings. IMPRESSION: 1. No pulmonary embolus. 2. Multifocal nodular consolidations throughout all lobes of both lungs, slightly less diffuse and more coalescent than on CT last month. Differential considerations are broad. Infection, including atypical organisms, non infectious pneumonitis, vasculitis or drug reaction are considered. Recommend correlation with recent bronchoscopy. 3. Unchanged cavitary lesion in the anterior left upper lobe. 4. Shotty mediastinal and hilar lymph nodes are likely reactive. Aortic Atherosclerosis (ICD10-I70.0). Electronically Signed   By: Andrea Gasman M.D.   On: 05/21/2024 18:25   DG Chest 2 View Result Date: 05/21/2024 EXAM: 2 VIEW(S) XRAY OF THE CHEST 05/21/2024 05:08:37 PM COMPARISON: 04/07/2024 CLINICAL HISTORY: sob. Per pt chart: Pt reports SHOB x 2 days. Pt states she was sent from UC today and was told she had pneumonia FINDINGS: LUNGS AND PLEURA: Decreased bilateral airspace opacities. Left upper lobe cavitary lesion not well visualized. Interstitial prominence. No pulmonary edema. No pleural effusion. No pneumothorax. HEART AND MEDIASTINUM: No acute abnormality of the cardiac and mediastinal silhouettes. BONES AND SOFT TISSUES: Left axillary surgical clips. No acute  osseous abnormality. IMPRESSION: 1. Decreased bilateral airspace opacities and interstitial prominence. Residual or recurrent pneumonia is not excluded. Electronically signed by: Lonni Necessary MD 05/21/2024 05:35 PM EDT RP Workstation: [id number]     EKG: My personal interpretation of EKG shows: Normal sinus rhythm heart rate 83.    Assessment/Plan: Principal Problem:   Acute bronchitis with bronchiectasis (HCC) Active Problems:   Community acquired pneumonia   Acute exacerbation of bronchiectasis (HCC)   History of hemoptysis   History of breast cancer   Essential hypertension   Hyperlipidemia   GAD (generalized anxiety disorder)    Assessment and Plan: Acute bronchitis with bronchiectasis -Presented emergency department with complaining of worsening shortness of breath and cough.  Patient recently has pneumonia and hemoptysis in the setting of MAC infection, Pseudomonas infection and AFB culture result positive-completed 3 weeks course of ciprofloxacin  and 2 weeks course of Augmentin  followed by follow-up with outpatient ID clinic 10/14.  Patient was doing fine until for last few days having shortness of breath and cough.  Denies any wheezing, fever and chill.  Having productive cough.  Denies any current hemoptysis. - Presentation to ED patient is hemodynamically stable except blood pressure borderline soft.  96 to 98% room air. -CBC and BMP unremarkable.  Lactic acid level normal proBNP.  Slight elevated D-dimer. -CTA chest rule out PE however it showed ultifocal nodular consolidations throughout all lobes of both lungs, slightly less diffuse and more coalescent than on CT last month. Differential considerations are broad. Infection, including atypical organisms, non infectious pneumonitis, vasculitis or drug reaction are considered. Recommend correlation with  recent bronchoscopy. -EDP discussed case with pulmonology recommended to start long-acting bronchodilator Breztri  and treat with broad-spectrum antibiotic.  Neurology will see patient for consult tomorrow - In the patient received IV Solu-Medrol, and Breztri because started from tonight. - Continue IV Solu-Medrol 40 mg twice daily, pulmonary toiletry, spirometry, Mucomyst nebulizer twice daily and DuoNeb as needed.  Continue Tessalon and supportive care. -Continue check pulse ox supplemental oxygen  as needed to keep O2 sat above 96%.  Continue telemonitoring. Addendum - Mucomyst only available for ICU patients.  Changing Mucomyst to hypertonic nebulizer nebs.   History of MAC infection, Pseudomonas infection and AFB culture positive History of hemoptysis Community-acquired pneumonia -Patient has recent hemothoraces in the setting of MAC Pseudomonas and bronchoscopy showed positive AFB culture per chart review of the ID physician clinic notes. - Chest x-ray and CT scan showing multifocal pneumonia. - Patient does not meet sepsis criteria at this time. - Continue broad-spectrum coverage with IV vancomycin  and cefepime . - Obtaining blood culture, sputum culture, respiratory panel, urine Legionella, urine strep antigen and checking procalcitonin level. -Continue supportive care as mentioned above.   History of breast cancer status bilateral postmastectomy   Essential hypertension -Holding home blood pressure regimen in the setting of borderline hypotension.  Giving 1 L of LR bolus followed by continue maintenance fluid LR 100 cc/h  Hyperlipidemia -Continue Lipitor  GERD - Continue famotidine   Generalized anxiety disorder -Currently not on medications at home   DVT prophylaxis:  SCDs and TED hose.  Given patient has recent history of hemoptysis deferring pharmacological DVT prophylaxis as of now.  If there is no recurrence of hemoptysis can consider to initiate. Code Status:  Full Code Diet: Heart healthy diet. Family Communication:   Family was present at bedside, at the time of interview.  Opportunity was given to ask question and all questions were answered satisfactorily.  Disposition Plan: Need to follow-up with culture results and pulmonology recommendation. Consults: Pulmonology service and case management (for inhaler approval by insurance) Admission status:   Inpatient, progressive unit  Severity of Illness: The appropriate patient status for this patient is INPATIENT. Inpatient status is judged to be reasonable and necessary in order to provide the required intensity of service to ensure the patient's safety. The patient's presenting symptoms, physical exam findings, and initial radiographic and laboratory data in the context of their chronic comorbidities is felt to place them at high risk for further clinical deterioration. Furthermore, it is not anticipated that the patient will be medically stable for discharge from the hospital within 2 midnights of admission.   * I certify that at the point of admission it is my clinical judgment that the patient will require inpatient hospital care spanning beyond 2 midnights from the point of admission due to high intensity of service, high risk for further deterioration and high frequency of surveillance required.DEWAINE    Mialani Reicks, MD [facility]  How to contact the [facility] Attending or Consulting provider 7A - 7P or covering provider during after hours 7P -7A, for this patient.  Check the care team in [facility] and look for a) attending/consulting [facility] provider listed and b) the [facility] team listed Log into [email or web address] and use [facility] universal password to access. If you do not have the password, please contact the hospital operator. Locate the [facility] provider you are looking for under [facility] and page to a number that you can be directly reached. If you still have difficulty reaching the provider, please page the DOC (  Director on Call) for the Hospitalists listed on amion for assistance.  05/21/2024, 9:42 PM

## 2024-05-21 NOTE — ED Triage Notes (Signed)
 Pt reports SHOB x 2 days. Pt states she was sent from UC today and was told she had pneumonia. She received steroid and an antibioti PTA.

## 2024-05-21 NOTE — ED Notes (Signed)
 Unsuccessful IV attempt x2.

## 2024-05-21 NOTE — ED Notes (Signed)
 Save blue tube in main lab

## 2024-05-21 NOTE — ED Notes (Signed)
 Patient transported to CT

## 2024-05-21 NOTE — ED Provider Notes (Signed)
 Brandi Bates Provider Note   CSN: [id number] Arrival date & time: 05/21/24  1459     Patient presents with: Shortness of Breath   Brandi Bates is a 68 y.o. female.   HPI Patient presents with her husband who assists with the history. Patient has a history of pneumonia, diagnosed 1 month ago she also has a history of pulmonary embolism in the past.  She notes that after improving with her antibiotics, after she completed the course, she began to feel worse, and today presents after initially going to urgent care due to worsening shortness of breath, cough. At urgent care she reportedly had x-ray that was abnormal and she was sent here for additional evaluation. No fever, syncope, appreciable chest pain.    Prior to Admission medications   Medication Sig Start Date End Date Taking? Authorizing Provider  alendronate (FOSAMAX) 70 MG tablet Take 70 mg by mouth once a week. 02/11/24   [provider]  Aspirin-Acetaminophen -Caffeine (GOODY HEADACHE PO) Take 1 packet by mouth daily as needed (pain).    [provider]  atorvastatin  (LIPITOR) 10 MG tablet Take 10 mg by mouth daily. 11/16/23   [provider]  calcium  carbonate (TUMS - DOSED IN MG ELEMENTAL CALCIUM ) 500 MG chewable tablet Chew 1 tablet by mouth as needed. Reported on 08/06/2015    [provider]  cholecalciferol (VITAMIN D3) 25 MCG (1000 UT) tablet Take 1 tablet (1,000 Units total) by mouth daily. 06/02/18   Magrinat, Sandria BROCKS, MD  famotidine  (PEPCID ) 20 MG tablet Take 20 mg by mouth daily.    [provider]  fluconazole  (DIFLUCAN ) 100 MG tablet Take 1 tablet if you develop yeast infection on antibiotics Patient not taking: Reported on 05/09/2024 04/10/24   Verdene Purchase, MD  lisinopril -hydrochlorothiazide  (ZESTORETIC ) 20-12.5 MG tablet Take 1 tablet by mouth daily.    [provider]  Potassium 99 MG TABS Take daily by mouth.     [provider]  vitamin C (ASCORBIC ACID) 500 MG tablet Take 500 mg by mouth daily.    [provider]  gabapentin (NEURONTIN) 300 MG capsule daily. 09/17/11 10/02/11  [provider]    Allergies: Morphine  and codeine, Omeprazole magnesium , Gabapentin, Prilosec [omeprazole], Tamoxifen citrate, and Latex    Review of Systems  Updated Vital Signs BP 126/70 (BP Location: Right Arm)   Pulse 83   Temp 98.8 F (37.1 C) (Oral)   Resp (!) 24   SpO2 98%   Physical Exam Vitals and nursing note reviewed.  Constitutional:      General: She is not in acute distress.    Appearance: She is well-developed.  HENT:     Head: Normocephalic and atraumatic.  Eyes:     Conjunctiva/sclera: Conjunctivae normal.  Cardiovascular:     Rate and Rhythm: Normal rate and regular rhythm.  Pulmonary:     Effort: Pulmonary effort is normal. Tachypnea present.     Breath sounds: Decreased breath sounds present. No wheezing.  Abdominal:     General: There is no distension.  Skin:    General: Skin is warm and dry.  Neurological:     Mental Status: She is alert and oriented to person, place, and time.     Cranial Nerves: No cranial nerve deficit.  Psychiatric:        Mood and Affect: Mood normal.     (all labs ordered are listed, but only abnormal results are displayed) Labs Reviewed  BASIC METABOLIC PANEL WITH GFR - Abnormal; Notable for the following components:      Result Value   Glucose, Bld 103 (*)    All other components within normal limits  CBC    EKG: EKG Interpretation Date/Time:  Sunday May 21 2024 15:10:22 EDT Ventricular Rate:  83 PR Interval:  146 QRS Duration:  97 QT Interval:  369 QTC Calculation: 434 R Axis:   39  Text Interpretation: Sinus rhythm Minimal ST depression, inferior leads Baseline wander in lead(s) I III aVL Confirmed by Garrick Charleston [phone]) on 05/21/2024 4:11:48 PM  Radiology: No results found.   Procedures   Medications  Ordered in the ED - No data to display                                  Medical Decision Making Adult female with history of breast cancer, smoking in the distant past, recent pneumonia as well as prior pulmonary embolism, seemingly not currently on anticoagulation presents with shortness of breath.  Broad differential including pneumonia, PE, malignancy. Cardiac 85 sinus normal pulse ox 98% nasal cannula supplement abnormal  Amount and/or Complexity of Data Reviewed Independent Historian: spouse External Data Reviewed: notes.    Details: Urgent care x-ray interpretation reviewed Labs: ordered. Decision-making details documented in ED Course. Radiology: ordered and independent interpretation performed. Decision-making details documented in ED Course. ECG/medicine tests: ordered and independent interpretation performed. Decision-making details documented in ED Course.  Risk Prescription drug management. Decision regarding hospitalization. Diagnosis or treatment significantly limited by social determinants of health.  X-ray abnormal with multiple areas of opacification With positive D-dimer CT scan ordered.  7:35 PM Patient aware of all findings, including CT scan without pulmonary embolism, but with evidence for worsening bronchiectasis with multiple consolidations, including more coalescent lesions. I discussed the patient's case with our pulmonology team given the patient's history of MAC, bronchiectasis.  Patient will have broad-spectrum antibiotics, triple neb therapy, steroids, will be admitted for further monitoring, management.  Pulmonology to follow, see in the morning.   Final diagnoses:  HCAP (healthcare-associated pneumonia)     Garrick Charleston, MD 05/21/24 [phone]

## 2024-05-22 ENCOUNTER — Telehealth: Payer: Self-pay

## 2024-05-22 DIAGNOSIS — F411 Generalized anxiety disorder: Secondary | ICD-10-CM

## 2024-05-22 DIAGNOSIS — I10 Essential (primary) hypertension: Secondary | ICD-10-CM | POA: Diagnosis not present

## 2024-05-22 DIAGNOSIS — J188 Other pneumonia, unspecified organism: Secondary | ICD-10-CM

## 2024-05-22 DIAGNOSIS — B348 Other viral infections of unspecified site: Secondary | ICD-10-CM | POA: Diagnosis not present

## 2024-05-22 DIAGNOSIS — J449 Chronic obstructive pulmonary disease, unspecified: Secondary | ICD-10-CM

## 2024-05-22 DIAGNOSIS — A310 Pulmonary mycobacterial infection: Secondary | ICD-10-CM

## 2024-05-22 DIAGNOSIS — R918 Other nonspecific abnormal finding of lung field: Secondary | ICD-10-CM

## 2024-05-22 DIAGNOSIS — E785 Hyperlipidemia, unspecified: Secondary | ICD-10-CM

## 2024-05-22 DIAGNOSIS — J129 Viral pneumonia, unspecified: Secondary | ICD-10-CM

## 2024-05-22 DIAGNOSIS — B965 Pseudomonas (aeruginosa) (mallei) (pseudomallei) as the cause of diseases classified elsewhere: Secondary | ICD-10-CM | POA: Diagnosis not present

## 2024-05-22 DIAGNOSIS — R911 Solitary pulmonary nodule: Secondary | ICD-10-CM

## 2024-05-22 LAB — COMPREHENSIVE METABOLIC PANEL WITH GFR
ALT: 39 U/L (ref 0–44)
AST: 31 U/L (ref 15–41)
Albumin: 3.8 g/dL (ref 3.5–5.0)
Alkaline Phosphatase: 82 U/L (ref 38–126)
Anion gap: 13 (ref 5–15)
BUN: 16 mg/dL (ref 8–23)
CO2: 22 mmol/L (ref 22–32)
Calcium: 9.9 mg/dL (ref 8.9–10.3)
Chloride: 107 mmol/L (ref 98–111)
Creatinine, Ser: 0.77 mg/dL (ref 0.44–1.00)
GFR, Estimated: >60 mL/min (ref >60)
Glucose, Bld: 218 mg/dL — ABNORMAL HIGH (ref 70–99)
Potassium: 4 mmol/L (ref 3.5–5.1)
Sodium: 142 mmol/L (ref 135–145)
Total Bilirubin: 0.3 mg/dL (ref 0.0–1.2)
Total Protein: 7.1 g/dL (ref 6.5–8.1)

## 2024-05-22 LAB — CBC
HCT: 42 % (ref 36.0–46.0)
Hemoglobin: 12.2 g/dL (ref 12.0–15.0)
MCH: 26.1 pg (ref 26.0–34.0)
MCHC: 29 g/dL — ABNORMAL LOW (ref 30.0–36.0)
MCV: 89.9 fL (ref 80.0–100.0)
Platelets: 183 K/uL (ref 150–400)
RBC: 4.67 MIL/uL (ref 3.87–5.11)
RDW: 15 % (ref 11.5–15.5)
WBC: 5.6 K/uL (ref 4.0–10.5)
nRBC: 0 % (ref 0.0–0.2)

## 2024-05-22 LAB — RESPIRATORY PANEL BY PCR

## 2024-05-22 LAB — MRSA NEXT GEN BY PCR, NASAL: MRSA by PCR Next Gen: NOT DETECTED

## 2024-05-22 LAB — PROCALCITONIN: Procalcitonin: <0.1 ng/mL

## 2024-05-22 LAB — STREP PNEUMONIAE URINARY ANTIGEN: Strep Pneumo Urinary Antigen: NEGATIVE

## 2024-05-22 MED ORDER — LISINOPRIL 20 MG PO TABS
20.0000 mg | ORAL_TABLET | Freq: Every day | ORAL | Status: DC
  Administered 2024-05-22 – 2024-05-23 (×2): 20 mg via ORAL
  Filled 2024-05-22 (×2): qty 1

## 2024-05-22 MED ORDER — ENOXAPARIN SODIUM 30 MG/0.3ML IJ SOSY
30.0000 mg | PREFILLED_SYRINGE | INTRAMUSCULAR | Status: DC
  Administered 2024-05-22: 30 mg via SUBCUTANEOUS
  Filled 2024-05-22: qty 0.3

## 2024-05-22 MED ORDER — ACETAMINOPHEN 325 MG PO TABS
650.0000 mg | ORAL_TABLET | Freq: Once | ORAL | Status: AC | PRN
  Administered 2024-05-22: 650 mg via ORAL
  Filled 2024-05-22: qty 2

## 2024-05-22 MED ORDER — ACETAMINOPHEN 325 MG PO TABS
650.0000 mg | ORAL_TABLET | ORAL | Status: DC | PRN
  Administered 2024-05-22 (×2): 650 mg via ORAL
  Filled 2024-05-22 (×2): qty 2

## 2024-05-22 MED ORDER — IPRATROPIUM-ALBUTEROL 0.5-2.5 (3) MG/3ML IN SOLN
3.0000 mL | Freq: Three times a day (TID) | RESPIRATORY_TRACT | Status: DC
  Administered 2024-05-22 – 2024-05-23 (×3): 3 mL via RESPIRATORY_TRACT
  Filled 2024-05-22 (×3): qty 3

## 2024-05-22 MED ORDER — KETOROLAC TROMETHAMINE 30 MG/ML IJ SOLN: 30.0000 mg | Freq: Four times a day (QID) | INTRAMUSCULAR | Status: DC | PRN

## 2024-05-22 MED ORDER — GUAIFENESIN 100 MG/5ML PO LIQD
15.0000 mL | Freq: Three times a day (TID) | ORAL | Status: DC
Start: 2024-05-22 — End: 2024-05-23
  Administered 2024-05-22 – 2024-05-23 (×4): 15 mL via ORAL
  Filled 2024-05-22 (×4): qty 20

## 2024-05-22 NOTE — Plan of Care (Signed)

## 2024-05-22 NOTE — Consult Note (Signed)
 [facility]                                                                                       Patient Identification: Patient Name: Brandi Bates MRN: [id number] Admit Date: 05/21/2024  3:05 PM Today's Date: 05/22/2024 Reason for consult MAC, pneumonia, abnormal CT chest, rhinovirus Requesting provider: Dr. Sebastian  Principal Problem:   Acute bronchitis with bronchiectasis [facility]) Active Problems:   Community acquired pneumonia   History of breast cancer   Acute exacerbation of bronchiectasis (HCC)   History of hemoptysis   Essential hypertension   Hyperlipidemia   GAD (generalized anxiety disorder)   Antibiotics:  Vancomycin /cefepime  10/26-  Lines/Hardware:  Assessment # Possible Viral multifocal pneumonia due to rhinovirus - Unlikely to have bacterial pneumonia with symptoms improved with nebulizers, 2 doses of cefepime  and 1 dose of vancomycin . 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 Overton with plan to repeat CT scan in 3-6 months   Recommendations  -Will DC vancomycin  and cefepime  as unlikely to have a bacterial pneumonia -Supportive management for viral pneumonia, which she most likely had contacted after attending a wedding event.  -Continue droplet precautions -Patient has a hospital follow-up arranged with Dr. Vu11/10 at 3:30 PM ID will sign off, recall back with questions or concerns.  Rest of the management as per the primary team. Please call with questions or concerns.  Thank you for the  consult  __________________________________________________________________________________________________________ HPI and Hospital Course: 68 year old female with prior history of HTN, HLD, GERD, breast CA s/p bilateral mastectomy, prior PE, anxiety, cavitary lung lesion s/p recent bronchoscopy 9/2 ( cx with MAC, penicillium spp) followed by admission 9/12-9/15 with hemoptysis, discharged on Augmentin /ciprofloxacin  approx 3 weeks (hemoptysis resolved by discharge), seen by Dr. Overton OP with significant improvement in symptoms on 10/14 who presented to the ED on 10/26 with worsening shortness of breath, cough for few days.  She reports attending a wedding on October 18 in the town and probably had a sick contact and started symptoms since following Monday with worsening cough, shortness of breath.  Cough productive of yellowish/greenish sputum which has almost resolved.  Denies, fever, chills or night sweats.  Denies chest pain, nausea vomiting or abdominal pain or diarrhea.   Seen in the urgent care prior to ED arrival with chest x-ray that reportedly showed pneumonia, given antibiotics and steroid and sent to ED.   At ED afebrile Labs with normal WBC and unremarkable BMP.  Lactic acid 1.5 procalcitonin less than 0.1 RVP positive for rhinovirus 10/27 blood cx 2/2 NGTD MRSA PCR negative  CTA Chest  IMPRESSION: 1. No pulmonary embolus. 2. Multifocal nodular consolidations throughout all lobes of both lungs, slightly less diffuse and more coalescent than on CT last month. Differential considerations are broad. Infection, including atypical organisms, non infectious pneumonitis,  vasculitis or drug reaction are considered. Recommend correlation with recent bronchoscopy. 3. Unchanged cavitary lesion in the anterior left upper lobe. 4. Shotty mediastinal and hilar lymph nodes are likely reactive.   Aortic Atherosclerosis (ICD10-I70.0).  Patient received DuoNeb, Solu-Medrol, IVF,  vancomycin  and  cefepime  and admitted  ROS: General- Denies fever, chills, loss of appetite and loss of weight HEENT - Denies headache, blurry vision, neck pain, sinus pain Chest - Denies any chest pain, SOB or cough CVS- Denies any dizziness/lightheadedness, syncopal attacks, palpitations Abdomen- Denies any nausea, vomiting, abdominal pain, hematochezia and diarrhea Neuro - Denies any weakness, numbness, tingling sensation Psych - Denies any changes in mood irritability or depressive symptoms GU- Denies any burning, dysuria, hematuria or increased frequency of urination Skin - denies any rashes/lesions MSK - denies any joint pain/swelling or restricted ROM   Past Medical History:  Diagnosis Date   Allergy    Anxiety    Bladder cystocele 11/20/11   low-lying ct result   Breast cancer (HCC) 10/23/10   s/p L mastectomy, chemo/radiation, er/pr +, Her2 -   Breast cancer (HCC) 11/10/11   S/P right mastectomy   Breast wound    Left breast from radiation, skin graft   Chronic low back pain    everyday   Eczema    GERD (gastroesophageal reflux disease)    Hepatic steatosis 11/20/11   severe    History of cancer chemotherapy    memory issues from chemo   History of radiation therapy 04/23/11 thru 06/08/11   L breast   Lymphedema of arm    left   Migraines    Neuromuscular disorder (HCC)    raynauds syndrome    Numbness of feet    Pneumonia April 2013   PONV (postoperative nausea and vomiting)    Pulmonary thromboembolism (HCC) 11/20/11   ct positive acute w/i segmental branches of right lower lobe   Shortness of breath on exertion    Past Surgical History:  Procedure Laterality Date   ABDOMINAL HYSTERECTOMY     BILATERAL SALPINGOOPHORECTOMY  11/10/11   laparoscopy   BREAST SURGERY Bilateral    mastectomy   CHOLECYSTECTOMY  1999   DIAGNOSTIC LAPAROSCOPY     IRRIGATION AND DEBRIDEMENT ABSCESS  06/16/2011   Procedure: IRRIGATION AND DEBRIDEMENT ABSCESS;  Surgeon: Vicenta DELENA Poli, MD;   Location: [facility];  Service: General;  Laterality: Left;  incision and drainage of left chest wall abcess   MASTECTOMY  11/10/11   right; w/SNB   MASTECTOMY MODIFIED RADICAL  10/23/11   left   MASTECTOMY W/ SENTINEL NODE BIOPSY  11/10/2011   Procedure: MASTECTOMY WITH SENTINEL LYMPH NODE BIOPSY;  Surgeon: Vicenta DELENA Poli, MD;  Location: [facility];  Service: General;  Laterality: Right;   PORT-A-CATH REMOVAL Right 09/26/2014   Procedure: MINOR REMOVAL PORT-A-CATH;  Surgeon: Vicenta Poli, MD;  Location: [facility];  Service: General;  Laterality: Right;   PORTACATH PLACEMENT     right subclavian   TUBAL LIGATION  1979   VIDEO BRONCHOSCOPY Bilateral 03/28/2024   Procedure: BRONCHOSCOPY, WITH FLUOROSCOPY;  Surgeon: Zaida Zola SAILOR, MD;  Location: [facility];  Service: Pulmonary;  Laterality: Bilateral;   Scheduled Meds:  ascorbic acid  500 mg Oral Daily   atorvastatin   10 mg Oral Daily   benzonatate  200 mg Oral TID   budesonide-glycopyrrolate -formoterol  2 puff Inhalation BID   cholecalciferol  1,000 Units Oral Daily   famotidine   20 mg Oral Daily   methylPREDNISolone (SOLU-MEDROL)  injection  40 mg Intravenous Q12H   sodium chloride  flush  3 mL Intravenous Q12H   sodium chloride  HYPERTONIC  4 mL Nebulization Daily   Continuous Infusions:  sodium chloride      ceFEPime  (MAXIPIME ) IV 2 g (05/22/24 0521)   lactated ringers  100 mL/hr at 05/22/24 0012   vancomycin      PRN Meds:.sodium chloride , ipratropium-albuterol , promethazine , sodium chloride  flush, sodium chloride  flush  Allergies  Allergen Reactions   Ciprofloxacin  Other (See Comments)    Doesn't tolerate medication and gives her uncontrollable tremors of head and hand   Morphine  And Codeine Hives and Itching    All over the body   Omeprazole Magnesium  Nausea Only   Gabapentin Other (See Comments)    Unable to sleep   Prilosec [Omeprazole] Nausea Only   Tamoxifen Citrate Other (See Comments)    Blood clot     Latex Rash    Only where touched   Social History   Socioeconomic History   Marital status: Married    Spouse name: Not on file   Number of children: Not on file   Years of education: Not on file   Highest education level: Not on file  Occupational History   Not on file  Tobacco Use   Smoking status: Former    Current packs/day: 0.00    Average packs/day: 2.0 packs/day for 28.0 years (56.0 ttl pk-yrs)    Types: Cigarettes    Start date: 05/31/1969    Quit date: 05/31/1997    Years since quitting: 26.9   Smokeless tobacco: Never  Vaping Use   Vaping status: Never Used  Substance and Sexual Activity   Alcohol use: No   Drug use: No   Sexual activity: Never    Comment: P1, 1st pregnancy age 76, no HRT  Other Topics Concern   Not on file  Social History Narrative   Not on file   Social Drivers of Health   Financial Resource Strain: Not on file  Food Insecurity: No Food Insecurity (05/22/2024)   Hunger Vital Sign    Worried About Running Out of Food in the Last Year: Never true    Ran Out of Food in the Last Year: Never true  Transportation Needs: No Transportation Needs (05/22/2024)   PRAPARE - Administrator, Civil Service (Medical): No    Lack of Transportation (Non-Medical): No  Physical Activity: Not on file  Stress: Not on file  Social Connections: Socially Integrated (05/22/2024)   Social Connection and Isolation Panel    Frequency of Communication with Friends and Family: More than three times a week    Frequency of Social Gatherings with Friends and Family: Once a week    Attends Religious Services: More than 4 times per year    Active Member of [employer] or Organizations: Yes    Attends Banker Meetings: Never    Marital Status: Married  Catering Manager Violence: Not At Risk (05/22/2024)   Humiliation, Afraid, Rape, and Kick questionnaire    Fear of Current or Ex-Partner: No    Emotionally Abused: No    Physically Abused: No     Sexually Abused: No    Vitals Patient Vitals for the past 24 hrs:  BP Temp Temp src Pulse Resp SpO2 Height Weight  05/22/24 0859 (!) 148/81 98.1 F (36.7 C) Oral 95 19 98 % -- --  05/22/24 0514 128/70 98.7 F (37.1 C) Oral 87 19 97 % -- --  05/22/24 0208 -- -- -- -- -- --  5' 4 (1.626 m) 75.3 kg  05/22/24 0154 (!) 140/72 98 F (36.7 C) Oral 88 -- 98 % -- --  05/22/24 0019 (!) 152/83 98 F (36.7 C) Oral 88 19 93 % -- --  05/21/24 2318 -- -- -- -- -- 95 % -- --  05/21/24 2316 -- -- -- -- -- 95 % -- --  05/21/24 2301 -- -- -- 88 16 95 % -- --  05/21/24 2300 (!) 152/96 -- -- -- (!) 22 -- -- --  05/21/24 2145 (!) 144/73 -- -- 74 16 94 % -- --  05/21/24 2000 (!) 116/94 -- -- 77 19 96 % -- --  05/21/24 1945 135/75 -- -- 86 (!) 23 94 % -- --  05/21/24 1915 97/78 -- -- 87 (!) 22 99 % -- --  05/21/24 1900 125/67 -- -- 76 19 95 % -- --  05/21/24 1821 (!) 143/84 97.9 F (36.6 C) Oral 78 15 95 % -- --  05/21/24 1745 -- -- -- 78 19 98 % -- --  05/21/24 1630 134/75 -- -- 80 20 96 % -- --  05/21/24 1510 126/70 98.8 F (37.1 C) Oral 83 (!) 24 98 % -- --    Physical Exam Constitutional: Adult female, sitting in the bed, not in acute distress.  Husband at bedside    Comments: HEENT WNL  Cardiovascular:     Rate and Rhythm: Normal rate and regular rhythm.     Heart sounds: S1 and S2  Pulmonary:     Effort: Pulmonary effort is normal.     Comments: Normal breath sounds no wheezing  Abdominal:     Palpations: Abdomen is soft.     Tenderness: Nontender and nondistended  Musculoskeletal:        General: No swelling or tenderness in peripheral extremities  Skin:    Comments: No rashes  Neurological:     General: Awake, alert and oriented, grossly nonfocal  Psychiatric:        Mood and Affect: Mood normal.    Pertinent Microbiology Results for orders placed or performed during the hospital encounter of 05/21/24  Respiratory (~20 pathogens) panel by PCR     Status: Abnormal    Collection Time: 05/21/24  8:08 PM   Specimen: Nasopharyngeal Swab; Respiratory  Result Value Ref Range Status   Adenovirus NOT DETECTED NOT DETECTED Final   Coronavirus 229E NOT DETECTED NOT DETECTED Final    Comment: (NOTE) The Coronavirus on the Respiratory Panel, DOES NOT test for the novel  Coronavirus (2019 nCoV)    Coronavirus HKU1 NOT DETECTED NOT DETECTED Final   Coronavirus NL63 NOT DETECTED NOT DETECTED Final   Coronavirus OC43 NOT DETECTED NOT DETECTED Final   Metapneumovirus NOT DETECTED NOT DETECTED Final   Rhinovirus / Enterovirus DETECTED (A) NOT DETECTED Final   Influenza A NOT DETECTED NOT DETECTED Final   Influenza B NOT DETECTED NOT DETECTED Final   Parainfluenza Virus 1 NOT DETECTED NOT DETECTED Final   Parainfluenza Virus 2 NOT DETECTED NOT DETECTED Final   Parainfluenza Virus 3 NOT DETECTED NOT DETECTED Final   Parainfluenza Virus 4 NOT DETECTED NOT DETECTED Final   Respiratory Syncytial Virus NOT DETECTED NOT DETECTED Final   Bordetella pertussis NOT DETECTED NOT DETECTED Final   Bordetella Parapertussis NOT DETECTED NOT DETECTED Final   Chlamydophila pneumoniae NOT DETECTED NOT DETECTED Final   Mycoplasma pneumoniae NOT DETECTED NOT DETECTED Final    Comment: Performed at [facility], [address],  [place], [place] [address]  Culture, blood (Routine X 2) w Reflex to ID Panel     Status: None (Preliminary result)   Collection Time: 05/22/24 12:10 AM   Specimen: BLOOD RIGHT FOREARM  Result Value Ref Range Status   Specimen Description   Final    BLOOD RIGHT FOREARM Performed at [facility], [address] [place], [place] [address]    Special Requests   Final    BOTTLES DRAWN AEROBIC AND ANAEROBIC Blood Culture results may not be optimal due to an inadequate volume of blood received in culture bottles Performed at [facility], [address], [place], [place] [address]    Culture   Final    NO GROWTH < 12  HOURS Performed at [facility], [address] [place], [place] [address]    Report Status PENDING  Incomplete  MRSA Next Gen by PCR, Nasal     Status: None   Collection Time: 05/22/24 12:29 AM   Specimen: Nasal Mucosa; Nasal Swab  Result Value Ref Range Status   MRSA by PCR Next Gen NOT DETECTED NOT DETECTED Final    Comment: (NOTE) The GeneXpert MRSA Assay (FDA approved for NASAL specimens only), is one component of a comprehensive MRSA colonization surveillance program. It is not intended to diagnose MRSA infection nor to guide or monitor treatment for MRSA infections. Test performance is not FDA approved in patients less than 38 years old. Performed at [facility], [address], [place], [place] [address]   Culture, blood (Routine X 2) w Reflex to ID Panel     Status: None (Preliminary result)   Collection Time: 05/22/24  4:09 AM   Specimen: BLOOD RIGHT ARM  Result Value Ref Range Status   Specimen Description BLOOD RIGHT ARM  Final   Special Requests   Final    BOTTLES DRAWN AEROBIC ONLY Blood Culture adequate volume   Culture   Final    NO GROWTH < 12 HOURS Performed at [facility], [address], [place], [place] [address]    Report Status PENDING  Incomplete   Pathology  03/28/24 FINAL MICROSCOPIC DIAGNOSIS:   A. LEFT LUNG, UPPER LOBE, BIOPSY:  Benign bronchial mucosa with chronic inflammation and single small  noncaseating granuloma  Benign alveolated lung with no diagnostic abnormality  Acid-fast bacilli and fungal stains negative (AFB and GMS, adequate  controls)  Negative for malignancy  Pertinent Lab seen by me:    Latest Ref Rng & Units 05/22/2024    4:09 AM 05/21/2024    3:24 PM 04/09/2024    5:27 AM  CBC  WBC 4.0 - 10.5 K/uL 5.6  6.3  9.8   Hemoglobin 12.0 - 15.0 g/dL 87.7  87.5  88.9   Hematocrit 36.0 - 46.0 % 42.0  41.2  36.6   Platelets 150 - 400 K/uL 183  258  287       Latest Ref Rng & Units 05/22/2024     4:09 AM 05/21/2024    3:24 PM 04/09/2024    5:27 AM  CMP  Glucose 70 - 99 mg/dL 781  896  880   BUN 8 - 23 mg/dL 16  22  12    Creatinine 0.44 - 1.00 mg/dL 9.22  9.14  9.38   Sodium 135 - 145 mmol/L 142  141  137   Potassium 3.5 - 5.1 mmol/L 4.0  4.0  3.9   Chloride 98 - 111 mmol/L 107  105  104   CO2 22 - 32 mmol/L 22  22  20    Calcium  8.9 - 10.3 mg/dL 9.9  89.6  9.5   Total Protein 6.5 - 8.1 g/dL 7.1   6.8   Total Bilirubin 0.0 - 1.2 mg/dL 0.3   0.6   Alkaline Phos 38 - 126 U/L 82   68   AST 15 - 41 U/L 31   21   ALT 0 - 44 U/L 39   35      Pertinent Imagings/Other Imagings Plain films and CT images have been personally visualized and interpreted; radiology reports have been reviewed. Decision making incorporated into the Impression / Recommendations.  CT Angio Chest PE W/Cm &/Or Wo Cm Result Date: 05/21/2024 CLINICAL DATA:  Provided history: Pulmonary embolism (PE) suspected, low to intermediate prob, positive D-dimer Shortness of breath. EXAM: CT ANGIOGRAPHY CHEST WITH CONTRAST TECHNIQUE: Multidetector CT imaging of the chest was performed using the standard protocol during bolus administration of intravenous contrast. Multiplanar CT image reconstructions and MIPs were obtained to evaluate the vascular anatomy. RADIATION DOSE REDUCTION: This exam was performed according to the departmental dose-optimization program which includes automated exposure control, adjustment of the mA and/or kV according to patient size and/or use of iterative reconstruction technique. CONTRAST:  75mL OMNIPAQUE  IOHEXOL  350 MG/ML SOLN COMPARISON:  Radiograph earlier today. Chest CTA 04/07/2024, CT 02/08/2024 FINDINGS: Cardiovascular: There are no filling defects within the pulmonary arteries to suggest pulmonary embolus. The heart is borderline enlarged. There are coronary artery calcifications. No aortic aneurysm or acute aortic findings. No pericardial effusion. Mediastinum/Nodes: Shotty mediastinal and hilar  lymph nodes. 10 mm right hilar node series 4, image 63 9 mm prevascular node series 4, image 47. Patulous esophagus. Lungs/Pleura: Multifocal nodular consolidations throughout all lobes of both lungs. Slight low less diffuse and more coalescent than on prior exam. The cavitary lesion in the anterior left upper lobe, series 12, image 53, is essentially unchanged with thick-walled lesion abutting the pleura anteriorly. No significant pleural effusion. Upper Abdomen: Cholecystectomy. Abdominal aortic atherosclerosis. No acute upper abdominal findings. Musculoskeletal: No acute osseous findings.  Bilateral mastectomy. Review of the MIP images confirms the above findings. IMPRESSION: 1. No pulmonary embolus. 2. Multifocal nodular consolidations throughout all lobes of both lungs, slightly less diffuse and more coalescent than on CT last month. Differential considerations are broad. Infection, including atypical organisms, non infectious pneumonitis, vasculitis or drug reaction are considered. Recommend correlation with recent bronchoscopy. 3. Unchanged cavitary lesion in the anterior left upper lobe. 4. Shotty mediastinal and hilar lymph nodes are likely reactive. Aortic Atherosclerosis (ICD10-I70.0). Electronically Signed   By: Andrea Gasman M.D.   On: 05/21/2024 18:25   DG Chest 2 View Result Date: 05/21/2024 EXAM: 2 VIEW(S) XRAY OF THE CHEST 05/21/2024 05:08:37 PM COMPARISON: 04/07/2024 CLINICAL HISTORY: sob. Per pt chart: Pt reports SHOB x 2 days. Pt states she was sent from [facility] today and was told she had pneumonia FINDINGS: LUNGS AND PLEURA: Decreased bilateral airspace opacities. Left upper lobe cavitary lesion not well visualized. Interstitial prominence. No pulmonary edema. No pleural effusion. No pneumothorax. HEART AND MEDIASTINUM: No acute abnormality of the cardiac and mediastinal silhouettes. BONES AND SOFT TISSUES: Left axillary surgical clips. No acute osseous abnormality. IMPRESSION: 1. Decreased  bilateral airspace opacities and interstitial prominence. Residual or recurrent pneumonia is not excluded. Electronically signed by: Lonni Necessary MD 05/21/2024 05:35 PM EDT RP Workstation: [id number]    I spent 85 minutes involved in face-to-face and non-face-to-face activities  for this patient on the day of the visit. Professional time spent includes the following activities: Preparing to see the patient (review of tests), Obtaining and reviewing separately obtained history (ED note, H&P, hospitalist progress note, Dr. Judson office note, pulmonary consult note), Performing a medically appropriate examination and evaluation, Ordering medications/labs, referring and communicating with other health care professionals, Documenting clinical information in the EMR, Independently interpreting results (not separately reported), Communicating results to the patient/husband, Counseling and educating the patient/husband and Care coordination (not separately reported).  Electronically signed by:   Plan d/w requesting provider as well as ID pharm D  Of note, portions of this note may have been created with voice recognition software. While this note has been edited for accuracy, occasional wrong-word or 'sound-a-like' substitutions may have occurred due to the inherent limitations of voice recognition software.   Annalee Orem, MD Infectious Disease Physician [facility] Pager: [phone]

## 2024-05-22 NOTE — Progress Notes (Signed)
 PROGRESS NOTE    Brandi Bates  FMW:[id number] DOB: 01-12-1956 DOA: 05/21/2024 PCP: Dayna Motto, DO    Chief Complaint  Patient presents with   Shortness of Breath    Brief Narrative: HPI per Dr.Sundil LEXANDRA RETTKE is a 68 y.o. 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  Patient reported that she was diagnosed with pneumonia and pulmonary embolism 1 month ago.  Reported noting improvement of pneumonia with antibiotics.  However for last few days she is starting having new symptom of cough and shortness of breath that is progressively getting worse and went to urgent care for evaluation, underwent chest x-ray which showed pneumonia and patient was sent to ED for evaluation.   Even though patient stating that she has pulmonary embolism per chart review recently admitted in September 2025 secondary to hemoptysis concern for MAC and Pseudomonas infection.  During that time ruled out PE.  Patient initially started on IV doxycycline  cefepime , hemoptysis resolved and ID recommended Augmentin  and Cipro  on discharge. Per chart review patient has infectious disease clinic visit 10/14 completed 3 weeks course of ciprofloxacin  and 2 weeks course of Augmentin  however still not at baseline.  ID  recommended repeat chest CT scan in 3 to 6 months.     During my evaluation at the bedside patient reported she is having cough initially it was dry nonproductive in nature with associated shortness of breath.  Denies any wheezing, paroxysmal nocturnal dyspnea or lower extremity swellings.  Denies any fever, chill, nausea,  vomiting and diarrhea.  Denies any chest pain and palpitation.  Denies any myalgia, joint pain, rash and neck pain.     Patient reported that she has experienced head tremor and bilateral hand tremor with ciprofloxacin  and requested to avoid Cipro  in the future.   ED Course:  At presentation to ED patient is hemodynamically stable except blood pressure is borderline soft 97/78.  O2 sat 100% room air.  Afebrile. Lab, elevated D-dimer.  Normal lactic acid level.  Normal proBNP.  CBC unremarkable.  BMP unremarkable.     CTA chest no evidence of pulmonary embolism. Multifocal nodular consolidations throughout all lobes of both lungs, slightly less diffuse and more coalescent than on CT last month. Differential considerations are broad. Infection, including atypical organisms, non infectious pneumonitis, vasculitis or drug reaction are considered. Recommend correlation with recent bronchoscopy. 3. Unchanged cavitary lesion in the anterior left upper lobe. 4. Shotty mediastinal and hilar lymph nodes are likely reactive.   Chest x-ray -Decreased bilateral airspace opacities and interstitial prominence. Residual or recurrent pneumonia is not excluded.   In the ED patient received DuoNeb nebulizer, Solu-Medrol, cefepime  and vancomycin . Dr. Garrick already consulted pulmonology recommended to treat for bronchiectasis and broad-spectrum antibiotic.  Pulmonology service will see patient tomorrow.   Hospitalist consulted for further evaluation management of acute exacerbation of bronchiectasis and community-acquired pneumonia.    Assessment & Plan:   Principal Problem:   Viral pneumonia Active Problems:   Community acquired pneumonia   Acute exacerbation of bronchiectasis (HCC)   History of hemoptysis   Rhinovirus infection   History of breast cancer   Essential hypertension   Hyperlipidemia   GAD (generalized anxiety disorder)  Acute bronchitis with bronchiectasis (HCC)  #1  multifocal viral pneumonia secondary to rhinovirus infection - With history of cavitary lung lesion status post bronchoscopy May 2025 negative for malignancy with culture showing MAC. - Patient recently hospitalized and treated for pneumonia, hemoptysis and treated with 3 weeks of Augmentin /ciprofloxacin  with resolution of hemoptysis by day of discharge presented to the ED with worsening shortness of breath, DOE, worsening productive cough of yellowish-greenish sputum. - Chest x-ray done on presentation to urgent care with concerns for pneumonia patient sent to the ED. - CT angiogram chest done negative for PE, multifocal nodular consolidations throughout all lobes of both lungs, slightly less diffuse; send than on CT a month ago.  Unchanged cavitary lesion in anterior left upper lobe.  Shotty mediastinal and hilar lymph nodes likely reactive. - Procalcitonin negative - Lactic acid 1.5. - proBNP of 103. - Respiratory viral panel positive for rhinovirus. - MRSA PCR not detected. - Patient with clinical improvement after nebulizer treatment, dose of IV antibiotics of cefepime  and vancomycin . - Patient seen in consultation by ID and pulmonary who feel patient likely has a viral pneumonia and not a bacterial pneumonia and antibiotics have been discontinued and recommending supportive care with outpatient follow-up as scheduled. - Continue Tessalon Perles, Breztri, hypertonic saline. - Placed on scheduled DuoNebs. - Discontinue IV steroids as recommended by pulmonary and if patient was develop wheezing or worsening shortness of breath may be placed on a 5-day course of prednisone 40 mg daily. - Supportive care.  2.  History of MAC infection/Pseudomonas infection - Patient seen in consultation by ID and pulmonary. - Outpatient follow-up with ID and pulmonary as scheduled.  3.  Hypertension -BP meds held on admission due to borderline blood pressure. - BP trending up - Saline lock IV fluids. -  Resume home regimen lisinopril .  4.  Hyperlipidemia -Continue statin.  5.  GERD -Continue Pepcid .  6.  Generalized anxiety disorder -Not on anxiolytics prior to admission. - Place on hydroxyzine as needed.   DVT prophylaxis: Lovenox  Code Status: Full Family Communication: Updated patient and husband at bedside. Disposition: Likely home when clinically improved.  Status is: Inpatient Remains inpatient appropriate because: Severity of illness   Consultants:  Pulmonary: Dr.Pawar 05/22/2024 Infectious disease: Dr.Manandhar 05/22/2024  Procedures:  CT angiogram chest 05/21/2024 Chest x-ray 05/21/2024   Antimicrobials:  Anti-infectives (From admission, onward)    Start     Dose/Rate Route Frequency Ordered Stop   05/22/24 2200  vancomycin  (VANCOREADY) IVPB 1250 mg/250 mL  Status:  Discontinued        1,250 mg 166.7 mL/hr over 90 Minutes Intravenous Every 24 hours 05/21/24 2012 05/22/24 0918   05/22/24 0600  ceFEPIme  (MAXIPIME ) 2 g in sodium chloride  0.9 % 100 mL IVPB  Status:  Discontinued        2 g 200 mL/hr over 30 Minutes Intravenous Every 8 hours 05/21/24 2012 05/22/24 1203   05/21/24 2015  vancomycin  (VANCOREADY) IVPB 1750 mg/350 mL        1,750 mg 175 mL/hr over 120 Minutes Intravenous  Once 05/21/24 2006 05/22/24 0013   05/21/24 1945  vancomycin  (VANCOCIN ) IVPB 1000 mg/200 mL premix  Status:  Discontinued        1,000 mg 200 mL/hr over 60 Minutes Intravenous  Once 05/21/24 1935 05/21/24 2006   05/21/24 1945  ceFEPIme  (MAXIPIME ) 2 g in sodium chloride  0.9 % 100 mL IVPB        2 g 200 mL/hr over 30 Minutes Intravenous  Once 05/21/24 1935 05/21/24 2129         Subjective: Patient sitting up in bed.  Husband at bedside.  Patient tearful states she is stressed that she might have to be on antibiotics for a whole year per ID physician if no clinical improvement.  Denies any chest pain.  Feels shortness of breath slightly improved from admission.  Still with a  productive cough of greenish-yellowish sputum.  Patient states she has been crying.  Objective: Vitals:   05/22/24 0208 05/22/24 0514 05/22/24 0859 05/22/24 1255  BP:  128/70 (!) 148/81 (!) 155/86  Pulse:  87 95 98  Resp:  19 19 18   Temp:  98.7 F (37.1 C) 98.1 F (36.7 C) 98.1 F (36.7 C)  TempSrc:  Oral Oral Oral  SpO2:  97% 98% 98%  Weight: 75.3 kg     Height: 5' 4 (1.626 m)       Intake/Output Summary (Last 24 hours) at 05/22/2024 1748 Last data filed at 05/22/2024 1300 Gross per 24 hour  Intake 1064.49 ml  Output --  Net 1064.49 ml   Filed Weights   05/22/24 0208  Weight: 75.3 kg    Examination:  General exam: Appears calm and comfortable  Respiratory system: Coarse/rhonchorous breath sounds on the right.  No wheezing.  Fair air movement speaking in full sentences.  Cardiovascular system: S1 & S2 heard, RRR. No JVD, murmurs, rubs, gallops or clicks. No pedal edema. Gastrointestinal system: Abdomen is nondistended, soft and nontender. No organomegaly or masses felt. Normal bowel sounds heard. Central nervous system: Alert and oriented. No focal neurological deficits. Extremities: Symmetric 5 x 5 power. Skin: No rashes, lesions or ulcers Psychiatry: Judgement and insight appear normal. Mood & affect appropriate.     Data Reviewed: I have personally reviewed following labs and imaging studies  CBC: Recent Labs  Lab 05/21/24 1524 05/22/24 0409  WBC 6.3 5.6  HGB 12.4 12.2  HCT 41.2 42.0  MCV 88.4 89.9  PLT 258 183    Basic Metabolic Panel: Recent Labs  Lab 05/21/24 1524 05/22/24 0409  NA 141 142  K 4.0 4.0  CL 105 107  CO2 22 22  GLUCOSE 103* 218*  BUN 22 16  CREATININE 0.85 0.77  CALCIUM  10.3 9.9    GFR: Estimated Creatinine Clearance: 67.8 mL/min (by C-G formula based on SCr of 0.77 mg/dL).  Liver Function Tests: Recent Labs  Lab 05/22/24 0409  AST 31  ALT 39  ALKPHOS 82  BILITOT 0.3  PROT 7.1  ALBUMIN 3.8    CBG: No results  for input(s): GLUCAP in the last 168 hours.   Recent Results (from the past 240 hours)  Respiratory (~20 pathogens) panel by PCR     Status: Abnormal   Collection Time: 05/21/24  8:08 PM   Specimen: Nasopharyngeal Swab; Respiratory  Result Value Ref Range Status   Adenovirus NOT DETECTED NOT DETECTED Final   Coronavirus 229E NOT DETECTED NOT DETECTED Final    Comment: (NOTE) The Coronavirus on the Respiratory Panel, DOES NOT test for the novel  Coronavirus (2019 nCoV)    Coronavirus HKU1 NOT DETECTED NOT DETECTED Final   Coronavirus NL63 NOT DETECTED NOT DETECTED Final   Coronavirus OC43 NOT DETECTED NOT DETECTED Final   Metapneumovirus NOT DETECTED NOT DETECTED Final   Rhinovirus / Enterovirus DETECTED (A) NOT DETECTED Final   Influenza A NOT DETECTED NOT DETECTED Final   Influenza B NOT DETECTED NOT DETECTED Final   Parainfluenza Virus 1 NOT  DETECTED NOT DETECTED Final   Parainfluenza Virus 2 NOT DETECTED NOT DETECTED Final   Parainfluenza Virus 3 NOT DETECTED NOT DETECTED Final   Parainfluenza Virus 4 NOT DETECTED NOT DETECTED Final   Respiratory Syncytial Virus NOT DETECTED NOT DETECTED Final   Bordetella pertussis NOT DETECTED NOT DETECTED Final   Bordetella Parapertussis NOT DETECTED NOT DETECTED Final   Chlamydophila pneumoniae NOT DETECTED NOT DETECTED Final   Mycoplasma pneumoniae NOT DETECTED NOT DETECTED Final    Comment: Performed at [facility], [address], [place], [place] [address]  Culture, blood (Routine X 2) w Reflex to ID Panel     Status: None (Preliminary result)   Collection Time: 05/22/24 12:10 AM   Specimen: BLOOD RIGHT FOREARM  Result Value Ref Range Status   Specimen Description   Final    BLOOD RIGHT FOREARM Performed at [facility], [address] [place], [place] [address]    Special Requests   Final    BOTTLES DRAWN AEROBIC AND ANAEROBIC Blood Culture results may not be optimal due to an inadequate volume of blood received in  culture bottles Performed at [facility], [address], [place], [place] [address]    Culture   Final    NO GROWTH < 12 HOURS Performed at [facility], [address] [place], [place] [address]    Report Status PENDING  Incomplete  MRSA Next Gen by PCR, Nasal     Status: None   Collection Time: 05/22/24 12:29 AM   Specimen: Nasal Mucosa; Nasal Swab  Result Value Ref Range Status   MRSA by PCR Next Gen NOT DETECTED NOT DETECTED Final    Comment: (NOTE) The GeneXpert MRSA Assay (FDA approved for NASAL specimens only), is one component of a comprehensive MRSA colonization surveillance program. It is not intended to diagnose MRSA infection nor to guide or monitor treatment for MRSA infections. Test performance is not FDA approved in patients less than 35 years old. Performed at [facility], [address] [place], [place] [address]   Culture, blood (Routine X 2) w Reflex to ID Panel     Status: None (Preliminary result)   Collection Time: 05/22/24  4:09 AM   Specimen: BLOOD RIGHT ARM  Result Value Ref Range Status   Specimen Description BLOOD RIGHT ARM  Final   Special Requests   Final    BOTTLES DRAWN AEROBIC ONLY Blood Culture adequate volume   Culture   Final    NO GROWTH < 12 HOURS Performed at [facility], [address] [place], [place] [address]    Report Status PENDING  Incomplete         Radiology Studies: CT Angio Chest PE W/Cm &/Or Wo Cm Result Date: 05/21/2024 CLINICAL DATA:  Provided history: Pulmonary embolism (PE) suspected, low to intermediate prob, positive D-dimer Shortness of breath. EXAM: CT ANGIOGRAPHY CHEST WITH CONTRAST TECHNIQUE: Multidetector CT imaging of the chest was performed using the standard protocol during bolus administration of intravenous contrast. Multiplanar CT image reconstructions and MIPs were obtained to evaluate the vascular anatomy. RADIATION DOSE REDUCTION: This exam was  performed according to the departmental dose-optimization program which includes automated exposure control, adjustment of the mA and/or kV according to patient size and/or use of iterative reconstruction technique. CONTRAST:  75mL OMNIPAQUE  IOHEXOL  350 MG/ML SOLN COMPARISON:  Radiograph earlier today. Chest CTA 04/07/2024, CT 02/08/2024 FINDINGS: Cardiovascular: There are no filling defects within the pulmonary  arteries to suggest pulmonary embolus. The heart is borderline enlarged. There are coronary artery calcifications. No aortic aneurysm or acute aortic findings. No pericardial effusion. Mediastinum/Nodes: Shotty mediastinal and hilar lymph nodes. 10 mm right hilar node series 4, image 63 9 mm prevascular node series 4, image 47. Patulous esophagus. Lungs/Pleura: Multifocal nodular consolidations throughout all lobes of both lungs. Slight low less diffuse and more coalescent than on prior exam. The cavitary lesion in the anterior left upper lobe, series 12, image 53, is essentially unchanged with thick-walled lesion abutting the pleura anteriorly. No significant pleural effusion. Upper Abdomen: Cholecystectomy. Abdominal aortic atherosclerosis. No acute upper abdominal findings. Musculoskeletal: No acute osseous findings.  Bilateral mastectomy. Review of the MIP images confirms the above findings. IMPRESSION: 1. No pulmonary embolus. 2. Multifocal nodular consolidations throughout all lobes of both lungs, slightly less diffuse and more coalescent than on CT last month. Differential considerations are broad. Infection, including atypical organisms, non infectious pneumonitis, vasculitis or drug reaction are considered. Recommend correlation with recent bronchoscopy. 3. Unchanged cavitary lesion in the anterior left upper lobe. 4. Shotty mediastinal and hilar lymph nodes are likely reactive. Aortic Atherosclerosis (ICD10-I70.0). Electronically Signed   By: Andrea Gasman M.D.   On: 05/21/2024 18:25   DG  Chest 2 View Result Date: 05/21/2024 EXAM: 2 VIEW(S) XRAY OF THE CHEST 05/21/2024 05:08:37 PM COMPARISON: 04/07/2024 CLINICAL HISTORY: sob. Per pt chart: Pt reports SHOB x 2 days. Pt states she was sent from [facility] today and was told she had pneumonia FINDINGS: LUNGS AND PLEURA: Decreased bilateral airspace opacities. Left upper lobe cavitary lesion not well visualized. Interstitial prominence. No pulmonary edema. No pleural effusion. No pneumothorax. HEART AND MEDIASTINUM: No acute abnormality of the cardiac and mediastinal silhouettes. BONES AND SOFT TISSUES: Left axillary surgical clips. No acute osseous abnormality. IMPRESSION: 1. Decreased bilateral airspace opacities and interstitial prominence. Residual or recurrent pneumonia is not excluded. Electronically signed by: Lonni Necessary MD 05/21/2024 05:35 PM EDT RP Workstation: [id number]        Scheduled Meds:  ascorbic acid  500 mg Oral Daily   atorvastatin   10 mg Oral Daily   benzonatate  200 mg Oral TID   budesonide-glycopyrrolate -formoterol  2 puff Inhalation BID   cholecalciferol  1,000 Units Oral Daily   enoxaparin  (LOVENOX ) injection  30 mg Subcutaneous Q24H   famotidine   20 mg Oral Daily   guaiFENesin   15 mL Oral Q8H   lisinopril   20 mg Oral Daily   methylPREDNISolone (SOLU-MEDROL) injection  40 mg Intravenous Q12H   sodium chloride  flush  3 mL Intravenous Q12H   sodium chloride  HYPERTONIC  4 mL Nebulization Daily   Continuous Infusions:  sodium chloride        LOS: 1 day    Time spent: 40 minutes    Toribio Hummer, MD [facility]   To contact the attending provider between 7A-7P or the covering provider during after hours 7P-7A, please log into the web site [email or web address] and access using universal [place] password for that web site. If you do not have the password, please call the hospital operator.  05/22/2024, 5:48 PM

## 2024-05-22 NOTE — Telephone Encounter (Signed)
 Request for outpatient follow-up made.

## 2024-05-22 NOTE — Consult Note (Addendum)
 NAME:  Brandi Bates, MRN:  [id number], DOB:  [birth year], LOS: 1 ADMISSION DATE:  05/21/2024 CHIEF COMPLAINT:  SOB.    REFERRING MD : Toribio Hummer, MD.  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  1 month ago admitted to the hospital and treated for pneumonia, hemoptysis then had resolved by the time of discharge.  Went to urgent care for progressive shortness of breath and cough which started 2-3 days ago, diagnosed with pneumonia and sent to the emergency roo  Labs: Procalcitonin negative, normal lactic acid normal proBNP.  D-dimer mildly elevated at 0.93.  Adjusted D-dimer less likely for VTE diagnosis.  RPP showing rhinovirus.  CT chest on admission reviewed by me showing stable left upper lobe cavitary lesion, improving bilateral ground glass opacities.  Talking to the patient she says after discharge from the hospital she felt back to her baseline and was able to ambulate without significant short of breath.  However now for last 3 days has been having cough with yellow phlegm, shortness of breath on exertion with inability to walk long distance and some intermittent wheeze.  Today she is feeling much better and not having phlegm anymore with improved exercise tolerance.  Ex-smoker, quit in 98.  Smoked 2 pack/day x 30 years.  Interim History / Subjective:  See above.   Significant Hospital Events: 10/26: Admitted to the hospital.  PAST MEDICAL HISTORY :   has a past medical history of Allergy, Anxiety, Bladder cystocele (11/20/11), Breast cancer (HCC) (10/23/10), Breast cancer (HCC) (11/10/11), Breast wound, Chronic low back pain, Eczema, GERD (gastroesophageal reflux disease), Hepatic steatosis (11/20/11), History of cancer chemotherapy,  History of radiation therapy (04/23/11 thru 06/08/11), Lymphedema of arm, Migraines, Neuromuscular disorder (HCC), Numbness of feet, Pneumonia (April 2013), PONV (postoperative nausea and vomiting), Pulmonary thromboembolism (HCC) (11/20/11), and Shortness of breath on exertion.  has a past surgical history that includes Abdominal hysterectomy; Irrigation and debridement abscess (06/16/2011); Bilateral salpingoophorectomy (11/10/11); Diagnostic laparoscopy; Mastectomy (11/10/11); Mastectomy modified radical (10/23/11); Cholecystectomy (1999); Tubal ligation (1979); Mastectomy w/ sentinel node biopsy (11/10/2011); Portacath placement; Breast surgery (Bilateral); Port-a-cath removal (Right, 09/26/2014); and Video bronchoscopy (Bilateral, 03/28/2024). Prior to Admission medications   Medication Sig Start Date End Date Taking? Authorizing Provider  alendronate (FOSAMAX) 70 MG tablet Take 70 mg by mouth once a week. 02/11/24  Yes [provider]  amoxicillin -clavulanate (AUGMENTIN ) 875-125 MG tablet Take 1 tablet by mouth 2 (two) times daily. 05/21/24  Yes [provider]  Aspirin-Acetaminophen -Caffeine (GOODY HEADACHE PO) Take 1 packet by mouth daily as needed (pain).   Yes [provider]  atorvastatin  (LIPITOR) 10 MG tablet Take 10 mg by mouth daily. 11/16/23  Yes [provider]  calcium  carbonate (TUMS - DOSED IN MG ELEMENTAL CALCIUM ) 500 MG chewable tablet Chew 1 tablet by mouth as needed. Reported on 08/06/2015   Yes [provider]  cholecalciferol (VITAMIN D3) 25 MCG (1000 UT) tablet Take 1 tablet (1,000 Units total) by mouth daily. 06/02/18  Yes Magrinat, Sandria BROCKS, MD  famotidine  (PEPCID ) 20 MG tablet Take 20 mg by mouth daily.   Yes [provider]  lisinopril -hydrochlorothiazide  (ZESTORETIC ) 20-12.5 MG tablet Take 1 tablet by mouth daily.   Yes [provider]  Potassium 99 MG TABS Take daily by mouth.   Yes [provider]  vitamin C  (ASCORBIC ACID) 500 MG tablet Take 500 mg by mouth daily.   Yes [provider]  doxycycline  (VIBRA -TABS) 100 MG tablet Take 100 mg by mouth 2 (two) times daily. Patient not taking: Reported on 05/21/2024 05/21/24   [provider]  gabapentin (NEURONTIN) 300 MG capsule daily. 09/17/11 10/02/11  [provider]    FAMILY HISTORY:  family history includes Cancer in her brother, cousin, father, and mother; Colon cancer (age of onset: 75) in an other family member; Hypertension in her brother, maternal grandmother, maternal uncle, mother, and sister.  SOCIAL HISTORY:  reports that she quit smoking about 26 years ago. Her smoking use included cigarettes. She started smoking about 55 years ago. She has a 56 pack-year smoking history. She has never used smokeless tobacco. She reports that she does not drink alcohol and does not use drugs.  REVIEW OF SYSTEMS:   Pertinent ROS as per HPI  VITAL SIGNS: Temp:  [97.9 F (36.6 C)-98.8 F (37.1 C)] 98.7 F (37.1 C) (10/27 0514) Pulse Rate:  [74-88] 87 (10/27 0514) Resp:  [15-24] 19 (10/27 0514) BP: (97-152)/(67-96) 128/70 (10/27 0514) SpO2:  [93 %-99 %] 97 % (10/27 0514) Weight:  [75.3 kg] 75.3 kg (10/27 0208)  PHYSICAL EXAMINATION: General: Elderly lady in no apparent distress. Lungs: No wheezing appreciated but rales in posterior lower lung field Heart: regular rate rhythm, no murmur appreciated.  Abdomen: non tender, non distended. Normal BS.  Neuro: Alert and oriented x 3.   Recent Labs  Lab 05/21/24 1524 05/22/24 0409  NA 141 142  K 4.0 4.0  CL 105 107  CO2 22 22  BUN 22 16  CREATININE 0.85 0.77  GLUCOSE 103* 218*   Recent Labs  Lab 05/21/24 1524 05/22/24 0409  HGB 12.4 12.2  HCT 41.2 42.0  WBC 6.3 5.6  PLT 258 183   CT Angio Chest PE W/Cm &/Or Wo Cm Result Date: 05/21/2024 CLINICAL DATA:  Provided history: Pulmonary embolism (PE) suspected, low to intermediate prob, positive D-dimer Shortness  of breath. EXAM: CT ANGIOGRAPHY CHEST WITH CONTRAST TECHNIQUE: Multidetector CT imaging of the chest was performed using the standard protocol during bolus administration of intravenous contrast. Multiplanar CT image reconstructions and MIPs were obtained to evaluate the vascular anatomy. RADIATION DOSE REDUCTION: This exam was performed according to the departmental dose-optimization program which includes automated exposure control, adjustment of the mA and/or kV according to patient size and/or use of iterative reconstruction technique. CONTRAST:  75mL OMNIPAQUE  IOHEXOL  350 MG/ML SOLN COMPARISON:  Radiograph earlier today. Chest CTA 04/07/2024, CT 02/08/2024 FINDINGS: Cardiovascular: There are no filling defects within the pulmonary arteries to suggest pulmonary embolus. The heart is borderline enlarged. There are coronary artery calcifications. No aortic aneurysm or acute aortic findings. No pericardial effusion. Mediastinum/Nodes: Shotty mediastinal and hilar lymph nodes. 10 mm right hilar node series 4, image 63 9 mm prevascular node series 4, image 47. Patulous esophagus. Lungs/Pleura: Multifocal nodular consolidations throughout all lobes of both lungs. Slight low less diffuse and more coalescent than on prior exam. The cavitary lesion in the anterior left upper lobe, series 12, image 53, is essentially unchanged with thick-walled lesion abutting the pleura anteriorly. No significant pleural effusion. Upper Abdomen: Cholecystectomy. Abdominal aortic atherosclerosis. No acute upper abdominal findings. Musculoskeletal: No acute osseous findings.  Bilateral mastectomy. Review of the MIP images confirms the above findings. IMPRESSION: 1. No pulmonary embolus. 2. Multifocal nodular consolidations throughout all lobes of both lungs, slightly less  diffuse and more coalescent than on CT last month. Differential considerations are broad. Infection, including atypical organisms, non infectious pneumonitis, vasculitis  or drug reaction are considered. Recommend correlation with recent bronchoscopy. 3. Unchanged cavitary lesion in the anterior left upper lobe. 4. Shotty mediastinal and hilar lymph nodes are likely reactive. Aortic Atherosclerosis (ICD10-I70.0). Electronically Signed   By: Andrea Gasman M.D.   On: 05/21/2024 18:25   DG Chest 2 View Result Date: 05/21/2024 EXAM: 2 VIEW(S) XRAY OF THE CHEST 05/21/2024 05:08:37 PM COMPARISON: 04/07/2024 CLINICAL HISTORY: sob. Per pt chart: Pt reports SHOB x 2 days. Pt states she was sent from UC today and was told she had pneumonia FINDINGS: LUNGS AND PLEURA: Decreased bilateral airspace opacities. Left upper lobe cavitary lesion not well visualized. Interstitial prominence. No pulmonary edema. No pleural effusion. No pneumothorax. HEART AND MEDIASTINUM: No acute abnormality of the cardiac and mediastinal silhouettes. BONES AND SOFT TISSUES: Left axillary surgical clips. No acute osseous abnormality. IMPRESSION: 1. Decreased bilateral airspace opacities and interstitial prominence. Residual or recurrent pneumonia is not excluded. Electronically signed by: Lonni Necessary MD 05/21/2024 05:35 PM EDT RP Workstation: [id number]    STUDIES:  See above.  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  Treatment for this is supportive.  May have underlying COPD with exacerbation though currently does not appear to be wheezing, reportedly improved after getting nebulizer treatment per patient. Her management of MAC will be taken care of as an outpatient and currently has no role for inpatient antibiotics for her MAC. - Will need repeat CT scan in 6 weeks from now to evaluate complete resolution other multifocal findings except for  known left upper lobe lung findings. - Continue supportive care for rhinovirus infection. - Continue with albuterol  nebs. - Will need PFTs as an outpatient. - If patient has worsening dyspnea with/without wheezing may consider treatment with 40 mg p.o. prednisone x 5 days along with prophylactic antibiotic azithromycin  for COPD exacerbation.  Hold off on this currently since the patient is feeling much better just with nebulizers. - Outpatient follow-up scheduled requested.  Please call PCCM with questions.  Will sign off for now.  Total time spent: 60 minutes which includes times spent in reviewing chart, documentation, talking to the patient and coordinating care with primary team.  Sammi JONETTA Fredericks, MD Pulmonary, Critical Care & Sleep Medicine [facility] Pager: [phone]  05/22/2024, 7:57 AM

## 2024-05-23 DIAGNOSIS — I10 Essential (primary) hypertension: Secondary | ICD-10-CM | POA: Diagnosis not present

## 2024-05-23 DIAGNOSIS — J129 Viral pneumonia, unspecified: Secondary | ICD-10-CM | POA: Diagnosis not present

## 2024-05-23 DIAGNOSIS — E785 Hyperlipidemia, unspecified: Secondary | ICD-10-CM | POA: Diagnosis not present

## 2024-05-23 DIAGNOSIS — B348 Other viral infections of unspecified site: Secondary | ICD-10-CM | POA: Diagnosis not present

## 2024-05-23 LAB — BASIC METABOLIC PANEL WITH GFR
Anion gap: 14 (ref 5–15)
BUN: 19 mg/dL (ref 8–23)
CO2: 21 mmol/L — ABNORMAL LOW (ref 22–32)
Calcium: 9.1 mg/dL (ref 8.9–10.3)
Chloride: 107 mmol/L (ref 98–111)
Creatinine, Ser: 0.74 mg/dL (ref 0.44–1.00)
GFR, Estimated: >60 mL/min (ref >60)
Glucose, Bld: 250 mg/dL — ABNORMAL HIGH (ref 70–99)
Potassium: 3.7 mmol/L (ref 3.5–5.1)
Sodium: 142 mmol/L (ref 135–145)

## 2024-05-23 LAB — CBC
HCT: 34.6 % — ABNORMAL LOW (ref 36.0–46.0)
Hemoglobin: 10.5 g/dL — ABNORMAL LOW (ref 12.0–15.0)
MCH: 27.1 pg (ref 26.0–34.0)
MCHC: 30.3 g/dL (ref 30.0–36.0)
MCV: 89.4 fL (ref 80.0–100.0)
Platelets: 238 K/uL (ref 150–400)
RBC: 3.87 MIL/uL (ref 3.87–5.11)
RDW: 15.5 % (ref 11.5–15.5)
WBC: 16.1 K/uL — ABNORMAL HIGH (ref 4.0–10.5)
nRBC: 0 % (ref 0.0–0.2)

## 2024-05-23 LAB — MAGNESIUM: Magnesium: 1.9 mg/dL (ref 1.7–2.4)

## 2024-05-23 MED ORDER — ALBUTEROL SULFATE HFA 108 (90 BASE) MCG/ACT IN AERS: INHALATION_SPRAY | RESPIRATORY_TRACT | 0 refills | Status: AC

## 2024-05-23 MED ORDER — BUDESON-GLYCOPYRROL-FORMOTEROL 160-9-4.8 MCG/ACT IN AERO: 2.0000 | INHALATION_SPRAY | Freq: Two times a day (BID) | RESPIRATORY_TRACT | 1 refills | Status: DC

## 2024-05-23 MED ORDER — BENZONATATE 200 MG PO CAPS: 200.0000 mg | ORAL_CAPSULE | Freq: Three times a day (TID) | ORAL | 0 refills | Status: AC

## 2024-05-23 MED ORDER — ENOXAPARIN SODIUM 40 MG/0.4ML IJ SOSY: 40.0000 mg | PREFILLED_SYRINGE | INTRAMUSCULAR | Status: DC

## 2024-05-23 NOTE — Discharge Summary (Signed)
 Physician Discharge Summary  Brandi Bates FMW:[id number] DOB: 1955/11/14 DOA: 05/21/2024  PCP: Dayna Motto, DO  Admit date: 05/21/2024 Discharge date: 05/23/2024  Time spent: 60 minutes  Recommendations for Outpatient Follow-up:  Follow-up with Dayna Motto, DO in 2 weeks. Follow-up with Dr. Overton as scheduled 06/05/2024 Follow-up with Dr. Donzetta, pulmonary as scheduled 07/03/2024   Discharge Diagnoses:  Principal Problem:   Viral pneumonia Active Problems:   Community acquired pneumonia   Acute exacerbation of bronchiectasis (HCC)   History of hemoptysis   Rhinovirus infection   History of breast cancer   Essential hypertension   Hyperlipidemia   GAD (generalized anxiety disorder)   Acute bronchitis with bronchiectasis (HCC)   Discharge Condition: Stable and improved.  Diet recommendation: Heart healthy  Filed Weights   05/22/24 0208  Weight: 75.3 kg    History of present illness:  HPI per Dr.Sundil Brandi Bates is a 68 y.o. 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  Patient reported that she was diagnosed with pneumonia and pulmonary embolism 1 month ago.  Reported noting improvement of pneumonia with antibiotics.  However for last few days she is starting having new symptom of cough and shortness of breath that is progressively getting worse and went to urgent care for evaluation, underwent chest x-ray which showed pneumonia and patient was sent to ED for evaluation.   Even though patient stating that she has pulmonary embolism per chart review recently admitted in September 2025  secondary to hemoptysis concern for MAC and Pseudomonas infection.  During that time ruled out PE.  Patient initially started on IV doxycycline  cefepime , hemoptysis resolved and ID recommended Augmentin  and Cipro  on discharge. Per chart review patient has infectious disease clinic visit 10/14 completed 3 weeks course of ciprofloxacin  and 2 weeks course of Augmentin  however still not at baseline.  ID  recommended repeat chest CT scan in 3 to 6 months.     During my evaluation at the bedside patient reported she is having cough initially it was dry nonproductive in nature with associated shortness of breath.  Denies any wheezing, paroxysmal nocturnal dyspnea or lower extremity swellings.  Denies any fever, chill, nausea, vomiting and diarrhea.  Denies any chest pain and palpitation.  Denies any myalgia, joint pain, rash and neck pain.     Patient reported that she has experienced head tremor and bilateral hand tremor with ciprofloxacin  and requested to avoid Cipro  in the future.   ED Course:  At presentation to ED patient is hemodynamically stable except blood pressure is borderline soft 97/78.  O2 sat 100% room air.  Afebrile. Lab, elevated D-dimer.  Normal lactic acid level.  Normal proBNP.  CBC unremarkable.  BMP unremarkable.     CTA chest no evidence of pulmonary embolism. Multifocal nodular consolidations throughout all lobes of both lungs, slightly less diffuse and more coalescent than on CT last month. Differential considerations are broad. Infection, including atypical organisms, non infectious pneumonitis, vasculitis or drug reaction are considered. Recommend correlation with recent bronchoscopy. 3. Unchanged cavitary lesion in the anterior left upper lobe. 4. Shotty mediastinal and hilar lymph nodes are likely reactive.   Chest x-ray -Decreased bilateral airspace opacities and interstitial prominence. Residual  or recurrent pneumonia is not excluded.   In the ED patient received  DuoNeb nebulizer, Solu-Medrol, cefepime  and vancomycin . Dr. Garrick already consulted pulmonology recommended to treat for bronchiectasis and broad-spectrum antibiotic.  Pulmonology service will see patient tomorrow.   Hospitalist consulted for further evaluation management of acute exacerbation of bronchiectasis and community-acquired pneumonia.   Significant labs in the ED: Lab Orders         Respiratory (~20 pathogens) panel by PCR         Culture, blood (Routine X 2) w Reflex to ID Panel         Expectorated Sputum Assessment w Gram Stain, Rflx to Resp Cult         MRSA Next Gen by PCR, Nasal         Basic metabolic panel         CBC         Pro Brain natriuretic peptide         D-dimer, quantitative         Legionella Pneumophila Serogp 1 Ur Ag         Strep pneumoniae urinary antigen         CBC         Comprehensive metabolic panel         Procalcitonin         I-Stat Lactic Acid      Hospital Course:  #1 multifocal viral pneumonia secondary to rhinovirus infection - With history of cavitary lung lesion status post bronchoscopy May 2025 negative for malignancy with culture showing MAC. - Patient recently hospitalized and treated for pneumonia, hemoptysis and treated with 3 weeks of Augmentin /ciprofloxacin  with resolution of hemoptysis by day of discharge presented to the ED with worsening shortness of breath, DOE, worsening productive cough of yellowish-greenish sputum. - Chest x-ray done on presentation to urgent care with concerns for pneumonia patient sent to the ED. - CT angiogram chest done negative for PE, multifocal nodular consolidations throughout all lobes of both lungs, slightly less diffuse; send than on CT a month ago.  Unchanged cavitary lesion in anterior left upper lobe.  Shotty mediastinal and hilar lymph nodes likely reactive. - Procalcitonin negative - Lactic acid 1.5. - proBNP of 103. - Respiratory viral panel positive for rhinovirus. - MRSA PCR not  detected. - Patient with clinical improvement after nebulizer treatment, dose of IV antibiotics of cefepime  and vancomycin . - Patient seen in consultation by ID and pulmonary who feel patient likely has a viral pneumonia and not a bacterial pneumonia and antibiotics have been discontinued and recommending supportive care with outpatient follow-up as scheduled. - Patient placed on Tessalon Perles, Breztri, hypertonic saline, scheduled DuoNebs. -IV steroids discontinued as recommended by pulmonary and if patient was develop wheezing or worsening shortness of breath may be placed on a 5-day course of prednisone 40 mg daily. -Patient improved clinically and be discharged home in stable and improved condition with outpatient follow-up with ID and pulmonary as scheduled.   2.  History of MAC infection/Pseudomonas infection - Patient seen in consultation by ID and pulmonary. - Outpatient follow-up with ID and pulmonary as scheduled.   3.  Hypertension -BP meds held on admission due to borderline blood pressure. - BP trended up. - Patient's home regimen lisinopril  was resumed.   - Patient's antihypertensive medications will be resumed on discharge.    4.  Hyperlipidemia - Patient maintained on statin.   5.  GERD - Patient maintained on Pepcid .  6.  Generalized anxiety disorder -Not on anxiolytics prior to admission. - Patient placed on hydroxyzine as needed.  Procedures: CT angiogram chest 05/21/2024 Chest x-ray 05/21/2024  Consultations: Pulmonary: Dr.Pawar 05/22/2024 Infectious disease: Dr.Manandhar 05/22/2024  Discharge Exam: Vitals:   05/23/24 1139 05/23/24 1420  BP: (!) 157/93   Pulse: 80   Resp: 18   Temp: 98.1 F (36.7 C)   SpO2: 98% 99%    General: NAD Cardiovascular: RRR no murmurs rubs or gallops.  No JVD.  No lower extremity edema. Respiratory: Some decreased right coarse breath sounds.  No wheezing.  Fair air movement.  Speaking in full sentences.  Discharge  Instructions   Discharge Instructions     Diet - low sodium heart healthy   Complete by: As directed    Increase activity slowly   Complete by: As directed       Allergies as of 05/23/2024       Reactions   Ciprofloxacin  Other (See Comments)   Doesn't tolerate medication and gives her uncontrollable tremors of head and hand   Morphine  And Codeine Hives, Itching   All over the body   Omeprazole Magnesium  Nausea Only   Gabapentin Other (See Comments)   Unable to sleep   Prilosec [omeprazole] Nausea Only   Tamoxifen Citrate Other (See Comments)   Blood clot    Latex Rash   Only where touched        Medication List     PAUSE taking these medications    amoxicillin -clavulanate 875-125 MG tablet Wait to take this until your doctor or other care provider tells you to start again. Commonly known as: AUGMENTIN  Take 1 tablet by mouth 2 (two) times daily.   doxycycline  100 MG tablet Wait to take this until your doctor or other care provider tells you to start again. Commonly known as: VIBRA -TABS Take 100 mg by mouth 2 (two) times daily.       TAKE these medications    albuterol  108 (90 Base) MCG/ACT inhaler Commonly known as: VENTOLIN  HFA Inhale 2 puffs into the lungs 3 (three) times daily for 5 days, THEN 2 puffs every 6 (six) hours as needed for wheezing or shortness of breath. Start taking on: May 23, 2024   alendronate 70 MG tablet Commonly known as: FOSAMAX Take 70 mg by mouth once a week.   ascorbic acid 500 MG tablet Commonly known as: VITAMIN C Take 500 mg by mouth daily.   atorvastatin  10 MG tablet Commonly known as: LIPITOR Take 10 mg by mouth daily.   benzonatate 200 MG capsule Commonly known as: TESSALON Take 1 capsule (200 mg total) by mouth 3 (three) times daily for 7 days.   budesonide-glycopyrrolate -formoterol 160-9-4.8 MCG/ACT Aero inhaler Commonly known as: BREZTRI Inhale 2 puffs into the lungs 2 (two) times daily.   calcium   carbonate 500 MG chewable tablet Commonly known as: TUMS - dosed in mg elemental calcium  Chew 1 tablet by mouth as needed. Reported on 08/06/2015   cholecalciferol 25 MCG (1000 UNIT) tablet Commonly known as: VITAMIN D3 Take 1 tablet (1,000 Units total) by mouth daily.   famotidine  20 MG tablet Commonly known as: PEPCID  Take 20 mg by mouth daily.   GOODY HEADACHE PO Take 1 packet by mouth daily as needed (pain).   lisinopril -hydrochlorothiazide  20-12.5 MG tablet Commonly known as: ZESTORETIC  Take 1 tablet by mouth daily.   Potassium 99 MG Tabs Take daily by mouth.       Allergies  Allergen Reactions  Ciprofloxacin  Other (See Comments)    Doesn't tolerate medication and gives her uncontrollable tremors of head and hand   Morphine  And Codeine Hives and Itching    All over the body   Omeprazole Magnesium  Nausea Only   Gabapentin Other (See Comments)    Unable to sleep   Prilosec [Omeprazole] Nausea Only   Tamoxifen Citrate Other (See Comments)    Blood clot    Latex Rash    Only where touched    Follow-up Information     Dayna Motto, DO. Schedule an appointment as soon as possible for a visit in 2 week(s).   Specialty: Family Medicine Contact information: [address] [place] [address] [phone]         Overton Constance DASEN, MD Follow up on 06/05/2024.   Specialty: Infectious Diseases Why: Follow-up at 3:30 PM as scheduled Contact information: [address] [place] [address] [phone]         Zaida Zola SAILOR, MD Follow up on 07/03/2024.   Specialty: Pulmonary Disease Why: Follow-up as scheduled at 1:30 PM Contact information: [address] [place] [address] [phone]                  The results of significant diagnostics from this hospitalization (including imaging, microbiology, ancillary and laboratory) are listed below for reference.    Significant Diagnostic Studies: CT Angio Chest PE W/Cm  &/Or Wo Cm Result Date: 05/21/2024 CLINICAL DATA:  Provided history: Pulmonary embolism (PE) suspected, low to intermediate prob, positive D-dimer Shortness of breath. EXAM: CT ANGIOGRAPHY CHEST WITH CONTRAST TECHNIQUE: Multidetector CT imaging of the chest was performed using the standard protocol during bolus administration of intravenous contrast. Multiplanar CT image reconstructions and MIPs were obtained to evaluate the vascular anatomy. RADIATION DOSE REDUCTION: This exam was performed according to the departmental dose-optimization program which includes automated exposure control, adjustment of the mA and/or kV according to patient size and/or use of iterative reconstruction technique. CONTRAST:  75mL OMNIPAQUE  IOHEXOL  350 MG/ML SOLN COMPARISON:  Radiograph earlier today. Chest CTA 04/07/2024, CT 02/08/2024 FINDINGS: Cardiovascular: There are no filling defects within the pulmonary arteries to suggest pulmonary embolus. The heart is borderline enlarged. There are coronary artery calcifications. No aortic aneurysm or acute aortic findings. No pericardial effusion. Mediastinum/Nodes: Shotty mediastinal and hilar lymph nodes. 10 mm right hilar node series 4, image 63 9 mm prevascular node series 4, image 47. Patulous esophagus. Lungs/Pleura: Multifocal nodular consolidations throughout all lobes of both lungs. Slight low less diffuse and more coalescent than on prior exam. The cavitary lesion in the anterior left upper lobe, series 12, image 53, is essentially unchanged with thick-walled lesion abutting the pleura anteriorly. No significant pleural effusion. Upper Abdomen: Cholecystectomy. Abdominal aortic atherosclerosis. No acute upper abdominal findings. Musculoskeletal: No acute osseous findings.  Bilateral mastectomy. Review of the MIP images confirms the above findings. IMPRESSION: 1. No pulmonary embolus. 2. Multifocal nodular consolidations throughout all lobes of both lungs, slightly less diffuse and  more coalescent than on CT last month. Differential considerations are broad. Infection, including atypical organisms, non infectious pneumonitis, vasculitis or drug reaction are considered. Recommend correlation with recent bronchoscopy. 3. Unchanged cavitary lesion in the anterior left upper lobe. 4. Shotty mediastinal and hilar lymph nodes are likely reactive. Aortic Atherosclerosis (ICD10-I70.0). Electronically Signed   By: Andrea Gasman M.D.   On: 05/21/2024 18:25   DG Chest 2 View Result Date: 05/21/2024 EXAM: 2 VIEW(S) XRAY OF  THE CHEST 05/21/2024 05:08:37 PM COMPARISON: 04/07/2024 CLINICAL HISTORY: sob. Per pt chart: Pt reports SHOB x 2 days. Pt states she was sent from [facility] today and was told she had pneumonia FINDINGS: LUNGS AND PLEURA: Decreased bilateral airspace opacities. Left upper lobe cavitary lesion not well visualized. Interstitial prominence. No pulmonary edema. No pleural effusion. No pneumothorax. HEART AND MEDIASTINUM: No acute abnormality of the cardiac and mediastinal silhouettes. BONES AND SOFT TISSUES: Left axillary surgical clips. No acute osseous abnormality. IMPRESSION: 1. Decreased bilateral airspace opacities and interstitial prominence. Residual or recurrent pneumonia is not excluded. Electronically signed by: Lonni Necessary MD 05/21/2024 05:35 PM EDT RP Workstation: [id number]    Microbiology: Recent Results (from the past 240 hours)  Respiratory (~20 pathogens) panel by PCR     Status: Abnormal   Collection Time: 05/21/24  8:08 PM   Specimen: Nasopharyngeal Swab; Respiratory  Result Value Ref Range Status   Adenovirus NOT DETECTED NOT DETECTED Final   Coronavirus 229E NOT DETECTED NOT DETECTED Final    Comment: (NOTE) The Coronavirus on the Respiratory Panel, DOES NOT test for the novel  Coronavirus (2019 nCoV)    Coronavirus HKU1 NOT DETECTED NOT DETECTED Final   Coronavirus NL63 NOT DETECTED NOT DETECTED Final   Coronavirus OC43 NOT DETECTED NOT  DETECTED Final   Metapneumovirus NOT DETECTED NOT DETECTED Final   Rhinovirus / Enterovirus DETECTED (A) NOT DETECTED Final   Influenza A NOT DETECTED NOT DETECTED Final   Influenza B NOT DETECTED NOT DETECTED Final   Parainfluenza Virus 1 NOT DETECTED NOT DETECTED Final   Parainfluenza Virus 2 NOT DETECTED NOT DETECTED Final   Parainfluenza Virus 3 NOT DETECTED NOT DETECTED Final   Parainfluenza Virus 4 NOT DETECTED NOT DETECTED Final   Respiratory Syncytial Virus NOT DETECTED NOT DETECTED Final   Bordetella pertussis NOT DETECTED NOT DETECTED Final   Bordetella Parapertussis NOT DETECTED NOT DETECTED Final   Chlamydophila pneumoniae NOT DETECTED NOT DETECTED Final   Mycoplasma pneumoniae NOT DETECTED NOT DETECTED Final    Comment: Performed at [facility], [address], [place], [place] [address]  Culture, blood (Routine X 2) w Reflex to ID Panel     Status: None (Preliminary result)   Collection Time: 05/22/24 12:10 AM   Specimen: BLOOD RIGHT FOREARM  Result Value Ref Range Status   Specimen Description   Final    BLOOD RIGHT FOREARM Performed at [facility], [address] [place], [place] [address]    Special Requests   Final    BOTTLES DRAWN AEROBIC AND ANAEROBIC Blood Culture results may not be optimal due to an inadequate volume of blood received in culture bottles Performed at [facility], [address], [place], [place] [address]    Culture   Final    NO GROWTH 1 DAY Performed at [facility], [address], [place], [place] [address]    Report Status PENDING  Incomplete  MRSA Next Gen by PCR, Nasal     Status: None   Collection Time: 05/22/24 12:29 AM   Specimen: Nasal Mucosa; Nasal Swab  Result Value Ref Range Status   MRSA by PCR Next Gen NOT DETECTED NOT DETECTED Final    Comment: (NOTE) The GeneXpert MRSA Assay (FDA approved for NASAL specimens only), is one component of a comprehensive MRSA colonization  surveillance program. It is not intended to diagnose MRSA infection nor to guide or monitor treatment for MRSA infections. Test performance is not  FDA approved in patients less than 37 years old. Performed at [facility] [address] [place], [place] [address]   Culture, blood (Routine X 2) w Reflex to ID Panel     Status: None (Preliminary result)   Collection Time: 05/22/24  4:09 AM   Specimen: BLOOD RIGHT ARM  Result Value Ref Range Status   Specimen Description BLOOD RIGHT ARM  Final   Special Requests   Final    BOTTLES DRAWN AEROBIC ONLY Blood Culture adequate volume   Culture   Final    NO GROWTH 1 DAY Performed at [facility], [address], [place], [place] [address]    Report Status PENDING  Incomplete     Labs: Basic Metabolic Panel: Recent Labs  Lab 05/21/24 1524 05/22/24 0409 05/23/24 0845  NA 141 142 142  K 4.0 4.0 3.7  CL 105 107 107  CO2 22 22 21*  GLUCOSE 103* 218* 250*  BUN 22 16 19   CREATININE 0.85 0.77 0.74  CALCIUM  10.3 9.9 9.1  MG  --   --  1.9   Liver Function Tests: Recent Labs  Lab 05/22/24 0409  AST 31  ALT 39  ALKPHOS 82  BILITOT 0.3  PROT 7.1  ALBUMIN 3.8   No results for input(s): LIPASE, AMYLASE in the last 168 hours. No results for input(s): AMMONIA in the last 168 hours. CBC: Recent Labs  Lab 05/21/24 1524 05/22/24 0409 05/23/24 0845  WBC 6.3 5.6 16.1*  HGB 12.4 12.2 10.5*  HCT 41.2 42.0 34.6*  MCV 88.4 89.9 89.4  PLT 258 183 238   Cardiac Enzymes: No results for input(s): CKTOTAL, CKMB, CKMBINDEX, TROPONINI in the last 168 hours. BNP: BNP (last 3 results) No results for input(s): BNP in the last 8760 hours.  ProBNP (last 3 results) Recent Labs    05/21/24 1524  PROBNP 103.0    CBG: No results for input(s): GLUCAP in the last 168 hours.     Signed:  Toribio Hummer MD.  [facility] 05/23/2024, 3:43 PM

## 2024-05-23 NOTE — Progress Notes (Signed)
 Patient AVS reviewed with patient and family at bedside. Midline removed and patient getting dressed to be discharged

## 2024-05-24 LAB — LEGIONELLA PNEUMOPHILA SEROGP 1 UR AG: L. pneumophila Serogp 1 Ur Ag: NEGATIVE

## 2024-05-26 DIAGNOSIS — I10 Essential (primary) hypertension: Secondary | ICD-10-CM | POA: Diagnosis not present

## 2024-05-26 DIAGNOSIS — E782 Mixed hyperlipidemia: Secondary | ICD-10-CM | POA: Diagnosis not present

## 2024-05-26 DIAGNOSIS — Z853 Personal history of malignant neoplasm of breast: Secondary | ICD-10-CM | POA: Diagnosis not present

## 2024-05-27 DIAGNOSIS — I10 Essential (primary) hypertension: Secondary | ICD-10-CM | POA: Diagnosis not present

## 2024-05-27 LAB — CULTURE, BLOOD (ROUTINE X 2)
Culture: NO GROWTH
Culture: NO GROWTH
Special Requests: ADEQUATE

## 2024-05-31 ENCOUNTER — Other Ambulatory Visit (HOSPITAL_COMMUNITY): Payer: Self-pay

## 2024-06-01 DIAGNOSIS — Z681 Body mass index (BMI) 19 or less, adult: Secondary | ICD-10-CM | POA: Diagnosis not present

## 2024-06-01 DIAGNOSIS — Z09 Encounter for follow-up examination after completed treatment for conditions other than malignant neoplasm: Secondary | ICD-10-CM | POA: Diagnosis not present

## 2024-06-01 DIAGNOSIS — J479 Bronchiectasis, uncomplicated: Secondary | ICD-10-CM | POA: Diagnosis not present

## 2024-06-01 DIAGNOSIS — R079 Chest pain, unspecified: Secondary | ICD-10-CM | POA: Diagnosis not present

## 2024-06-05 ENCOUNTER — Inpatient Hospital Stay: Admitting: Internal Medicine

## 2024-06-06 ENCOUNTER — Inpatient Hospital Stay: Admitting: Internal Medicine

## 2024-06-25 DIAGNOSIS — I10 Essential (primary) hypertension: Secondary | ICD-10-CM | POA: Diagnosis not present

## 2024-06-25 DIAGNOSIS — E782 Mixed hyperlipidemia: Secondary | ICD-10-CM | POA: Diagnosis not present

## 2024-06-25 DIAGNOSIS — Z853 Personal history of malignant neoplasm of breast: Secondary | ICD-10-CM | POA: Diagnosis not present

## 2024-07-03 ENCOUNTER — Ambulatory Visit

## 2024-07-03 VITALS — BP 110/72 | HR 60 | Temp 98.2°F | Ht 64.0 in | Wt 167.0 lb

## 2024-07-03 DIAGNOSIS — J479 Bronchiectasis, uncomplicated: Secondary | ICD-10-CM

## 2024-07-03 DIAGNOSIS — J984 Other disorders of lung: Secondary | ICD-10-CM | POA: Diagnosis not present

## 2024-07-03 DIAGNOSIS — Z87891 Personal history of nicotine dependence: Secondary | ICD-10-CM

## 2024-07-03 DIAGNOSIS — A310 Pulmonary mycobacterial infection: Secondary | ICD-10-CM

## 2024-07-03 NOTE — Patient Instructions (Signed)
  VISIT SUMMARY: Today, you were seen for concerns related to your bronchiectasis and Mycobacterium avium complex (MAC) infection, particularly the presence of green phlegm. We discussed your history of lung issues, including previous infections and treatments. You reported no fever or shortness of breath but noted the persistence of green phlegm and some residual mucus despite using a flutter valve. We also reviewed your adverse reactions to certain antibiotics and vaccines.  YOUR PLAN: -BRONCHIECTASIS: Bronchiectasis is a condition where the airways in your lungs are damaged, leading to recurrent infections. We have ordered vest therapy for home use and a nebulizer with albuterol  to help clear your airways. Continue using the flutter valve. We also ordered blood tests to check for connective tissue disease and immune function. Please call us  if the green phlegm persists or worsens, as you may need antibiotics.  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 We are awaiting the results of your CT scan in January to determine if there has been any progression. If the CT scan shows progression, we will discuss treatment options at that time.  -RECURRENT LOWER RESPIRATORY TRACT INFECTIONS: These are frequent infections in your lungs, likely due to your bronchiectasis. We discussed monitoring your symptoms closely and to call if they worsen, as you may need antibiotics. We also talked about the potential use of levofloxacin  if necessary, but with caution due to your previous reaction to cefepime .  INSTRUCTIONS: Please follow up with us  if your symptoms worsen or if you notice any new symptoms. We will review the results of your CT scan in January and discuss further treatment options if needed. Continue using the flutter valve and start using the nebulizer with albuterol  as prescribed. Call us  if the green phlegm persists or  worsens.       Contains text generated by [employer].

## 2024-07-03 NOTE — Addendum Note (Signed)
 Addended by: Malon Branton M on: 07/03/2024 02:16 PM   Modules accepted: Orders

## 2024-07-03 NOTE — Progress Notes (Signed)
 Subjective:   PATIENT ID: Brandi Bates [facility] GENDER: female DOB: 08/30/1955, MRN: [id number]   HPI Discussed the use of AI scribe software for clinical note transcription with the patient, who gave verbal consent to proceed.  History of Present Illness Brandi Bates is a 68 year old female with bronchiectasis and Mycobacterium avium complex (MAC) who presents with green phlegm and respiratory concerns.  She has a history of bronchiectasis and MAC infection, initially presenting with lung infiltrates and a cavitating lesion, leading to a bronchoscopy. This procedure resulted in complications, including bleeding and pneumonia, necessitating hospitalization. Cultures from the bronchoscopy confirmed MAC infection.  At the end of October, she was hospitalized again due to pneumonia and was treated with antibiotics.  At that time she was positive for rhinovirus.  Since discharge, she had been doing well until today, when she noticed green phlegm, which she associates with the onset of respiratory problems. No current fever or shortness of breath.  She uses a flutter valve to aid in airway clearance, which provides some relief, though she still feels there is residual mucus. She has not used a nebulizer at home but found it helpful during her hospital stay.  She has experienced tremors and body jerks, which she attributes to ciprofloxacin , though cefepime  administered during her hospital stay may have been the cause. She is cautious about using similar antibiotics due to these side effects.  She has a history of adverse reactions to the pneumonia vaccine, which caused significant arm swelling and fever. She has not received a flu shot this year.     Past Medical History:  Diagnosis Date   Allergy    Anxiety    Bladder cystocele 11/20/11   low-lying ct result   Breast cancer (HCC) 10/23/10   s/p L mastectomy, chemo/radiation, er/pr +, Her2 -   Breast cancer (HCC) 11/10/11   S/P right mastectomy    Breast wound    Left breast from radiation, skin graft   Chronic low back pain    everyday   Eczema    GERD (gastroesophageal reflux disease)    Hepatic steatosis 11/20/11   severe    History of cancer chemotherapy    memory issues from chemo   History of radiation therapy 04/23/11 thru 06/08/11   L breast   Lymphedema of arm    left   Migraines    Neuromuscular disorder (HCC)    raynauds syndrome    Numbness of feet    Pneumonia April 2013   PONV (postoperative nausea and vomiting)    Pulmonary thromboembolism (HCC) 11/20/11   ct positive acute w/i segmental branches of right lower lobe   Shortness of breath on exertion      Family History  Problem Relation Age of Onset   Cancer Mother        lung   Hypertension Mother    Cancer Father        lung   Cancer Brother        BRAIN CANCER   Hypertension Brother    Cancer Cousin         2 PATERNAL COUSINS - BREAST CA   Hypertension Sister    Hypertension Maternal Uncle    Hypertension Maternal Grandmother    Colon cancer Other 43       colon METS to liver     Social History   Socioeconomic History   Marital status: Married    Spouse name: Not on file  Number of children: Not on file   Years of education: Not on file   Highest education level: Not on file  Occupational History   Not on file  Tobacco Use   Smoking status: Former    Current packs/day: 0.00    Average packs/day: 2.0 packs/day for 28.0 years (56.0 ttl pk-yrs)    Types: Cigarettes    Start date: 05/31/1969    Quit date: 05/31/1997    Years since quitting: 27.1   Smokeless tobacco: Never  Vaping Use   Vaping status: Never Used  Substance and Sexual Activity   Alcohol use: No   Drug use: No   Sexual activity: Never    Comment: P1, 1st pregnancy age 31, no HRT  Other Topics Concern   Not on file  Social History Narrative   Not on file   Social Drivers of Health   Financial Resource Strain: Not on file  Food Insecurity: No Food  Insecurity (05/22/2024)   Hunger Vital Sign    Worried About Running Out of Food in the Last Year: Never true    Ran Out of Food in the Last Year: Never true  Transportation Needs: No Transportation Needs (05/22/2024)   PRAPARE - Administrator, Civil Service (Medical): No    Lack of Transportation (Non-Medical): No  Physical Activity: Not on file  Stress: Not on file  Social Connections: Socially Integrated (05/22/2024)   Social Connection and Isolation Panel    Frequency of Communication with Friends and Family: More than three times a week    Frequency of Social Gatherings with Friends and Family: Once a week    Attends Religious Services: More than 4 times per year    Active Member of [employer] or Organizations: Yes    Attends Banker Meetings: Never    Marital Status: Married  Catering Manager Violence: Not At Risk (05/22/2024)   Humiliation, Afraid, Rape, and Kick questionnaire    Fear of Current or Ex-Partner: No    Emotionally Abused: No    Physically Abused: No    Sexually Abused: No     Allergies  Allergen Reactions   Ciprofloxacin  Other (See Comments)    Doesn't tolerate medication and gives her uncontrollable tremors of head and hand   Morphine  And Codeine Hives and Itching    All over the body   Omeprazole Magnesium  Nausea Only   Gabapentin Other (See Comments)    Unable to sleep   Prilosec [Omeprazole] Nausea Only   Tamoxifen Citrate Other (See Comments)    Blood clot    Latex Rash    Only where touched     Outpatient Medications Prior to Visit  Medication Sig Dispense Refill   albuterol  (VENTOLIN  HFA) 108 (90 Base) MCG/ACT inhaler Inhale 2 puffs into the lungs 3 (three) times daily for 5 days, THEN 2 puffs every 6 (six) hours as needed for wheezing or shortness of breath. 8 g 0   alendronate (FOSAMAX) 70 MG tablet Take 70 mg by mouth once a week.     Aspirin-Acetaminophen -Caffeine (GOODY HEADACHE PO) Take 1 packet by mouth daily as  needed (pain).     atorvastatin  (LIPITOR) 10 MG tablet Take 10 mg by mouth daily.     calcium  carbonate (TUMS - DOSED IN MG ELEMENTAL CALCIUM ) 500 MG chewable tablet Chew 1 tablet by mouth as needed. Reported on 08/06/2015     cholecalciferol  (VITAMIN D3) 25 MCG (1000 UT) tablet Take 1 tablet (1,000 Units total)  by mouth daily.     famotidine  (PEPCID ) 20 MG tablet Take 20 mg by mouth daily. (Patient taking differently: Take 20 mg by mouth 2 (two) times daily.)     lisinopril -hydrochlorothiazide  (ZESTORETIC ) 20-12.5 MG tablet Take 1 tablet by mouth daily.     Potassium 99 MG TABS Take daily by mouth.     vitamin C  (ASCORBIC ACID ) 500 MG tablet Take 500 mg by mouth daily.     amoxicillin -clavulanate (AUGMENTIN ) 875-125 MG tablet Take 1 tablet by mouth 2 (two) times daily.     budesonide -glycopyrrolate -formoterol  (BREZTRI ) 160-9-4.8 MCG/ACT AERO inhaler Inhale 2 puffs into the lungs 2 (two) times daily. 10.7 g 1   doxycycline  (VIBRA -TABS) 100 MG tablet Take 100 mg by mouth 2 (two) times daily. (Patient not taking: Reported on 05/21/2024)     No facility-administered medications prior to visit.    ROS Reviewed all systems and reported negative except as above     Objective:   Vitals:   07/03/24 1328  BP: 110/72  Pulse: 60  Temp: 98.2 F (36.8 C)  TempSrc: Oral  SpO2: 98%  Weight: 167 lb (75.8 kg)  Height: 5' 4 (1.626 m)    Physical Exam Physical Exam GENERAL: Appropriate to age, no acute distress. HEAD EYES EARS NOSE THROAT: Moist mucous membranes, atraumatic, normocephalic. CHEST: Clear to auscultation bilaterally, no wheezing, no crackles, no rales. CARDIAC: Regular rate and rhythm, normal S1, normal S2, no murmurs, no rubs, no gallops. ABDOMEN: Soft, nontender. NEUROLOGICAL: Motor and sensation grossly intact, alert and oriented times X 3. EXTREMITIES: Warm, well perfused, no edema.     CBC    Component Value Date/Time   WBC 16.1 (H) 05/23/2024 0845   RBC 3.87  05/23/2024 0845   HGB 10.5 (L) 05/23/2024 0845   HGB 13.5 06/03/2017 0841   HCT 34.6 (L) 05/23/2024 0845   HCT 41.2 06/03/2017 0841   PLT 238 05/23/2024 0845   PLT 201 06/03/2017 0841   MCV 89.4 05/23/2024 0845   MCV 89.8 06/03/2017 0841   MCH 27.1 05/23/2024 0845   MCHC 30.3 05/23/2024 0845   RDW 15.5 05/23/2024 0845   RDW 13.6 06/03/2017 0841   LYMPHSABS 1.3 05/26/2018 0748   LYMPHSABS 1.2 06/03/2017 0841   MONOABS 0.3 05/26/2018 0748   MONOABS 0.2 06/03/2017 0841   EOSABS 0.2 05/26/2018 0748   EOSABS 0.1 06/03/2017 0841   BASOSABS 0.0 05/26/2018 0748   BASOSABS 0.0 06/03/2017 0841     Chest imaging: I reviewed her CT chest performed on 05/21/2024 which shows multifocal nodular consolidations and left upper lobe cavitary lung lesion.       Assessment & Plan:   Assessment and Plan Assessment & Plan Bronchiectasis Chronic lung damage and bronchiectasis likely related to Mycobacterium avium complex with abnormal airways causing recurrent infections. Current symptoms suggest possible infection. Patient has been using a flutter valve but does not report full clearance of her phlegm and has ongoing chest congestion and feeling of phlegm stuck. - Ordered vest therapy for home use. - Continue flutter valve. - Ordered nebulizer with albuterol . - I will order an extended workup for bronchiectasis although this is less likely to come back positive and likely her bronchiectasis is related to her Mycobacterium avium. - Advised to call if green phlegm persists or worsens for antibiotics.  Pulmonary mycobacterial infection (Mycobacterium avium complex) MAC infection suspected from bronchoscopy. Treatment is challenging with low success rate. She prefers to avoid antibiotics due to side effects. Treatment reconsidered if CT  shows progression. - Await CT scan results in January. - Discuss treatment options if CT shows progression with infectious disease.  Recurrent lower respiratory  tract infections Recurrent infections likely due to bronchiectasis. Recent hospitalization for rhinovirus with bacterial infection. Current symptoms suggest possible bacterial infection. Avoid unnecessary antibiotics to prevent resistance. - Advised to monitor symptoms and call if symptoms worsen for antibiotics. - Discussed potential use of levofloxacin  if needed, with caution due to previous cefepime  reaction.    I spent 42 minutes caring for this patient today, including preparing to see the patient, obtaining a medical history , reviewing a separately obtained history, performing a medically appropriate examination and/or evaluation, counseling and educating the patient/family/caregiver, ordering medications, tests, or procedures, referring and communicating with other health care professionals (not separately reported), documenting clinical information in the electronic health record, independently interpreting results (not separately reported/billed) and communicating results to the patient/family/caregiver, and care coordination (not separately reported/billed)  MDM  I reviewed prior external note(s) from recent hospitalization with consultation from Dr. Theodoro, ID visit with Dr. Overton  I reviewed the result(s) of her bronchoscopy showing Mycobacterium avium complex  I have ordered laboratory workup for evaluation of bronchiectasis including connective tissue disease workup.  Total immunoglobulin levels  Review of patient's CT images revealed nodular bilateral infiltrates. The patient's images have been independently reviewed by me.        Zola Herter, MD [facility] Office: [phone]

## 2024-07-04 LAB — ALPHA-1-ANTITRYPSIN: A-1 Antitrypsin, Ser: 141 mg/dL (ref 83–199)

## 2024-07-05 LAB — SJOGRENS SYNDROME-A EXTRACTABLE NUCLEAR ANTIBODY: SSA (Ro) (ENA) Antibody, IgG: <1 AI

## 2024-07-05 LAB — CYCLIC CITRUL PEPTIDE ANTIBODY, IGG: Cyclic Citrullin Peptide Ab: <16 U

## 2024-07-05 LAB — IGG, IGA, IGM
IgG (Immunoglobin G), Serum: 1215 mg/dL (ref 600–1540)
IgM, Serum: 151 mg/dL (ref 50–300)
Immunoglobulin A: 225 mg/dL (ref 70–320)

## 2024-07-05 LAB — SJOGRENS SYNDROME-B EXTRACTABLE NUCLEAR ANTIBODY: SSB (La) (ENA) Antibody, IgG: <1 AI

## 2024-07-05 LAB — RHEUMATOID FACTOR: Rheumatoid fact SerPl-aCnc: 18 [IU]/mL — ABNORMAL HIGH (ref <14)

## 2024-07-05 LAB — HIV ANTIBODY (ROUTINE TESTING W REFLEX)
HIV 1&2 Ab, 4th Generation: NONREACTIVE
HIV FINAL INTERPRETATION: NEGATIVE

## 2024-07-18 ENCOUNTER — Encounter: Payer: Self-pay | Admitting: Internal Medicine

## 2024-07-18 ENCOUNTER — Ambulatory Visit (INDEPENDENT_AMBULATORY_CARE_PROVIDER_SITE_OTHER): Admitting: Internal Medicine

## 2024-07-18 ENCOUNTER — Other Ambulatory Visit: Payer: Self-pay

## 2024-07-18 VITALS — BP 124/77 | HR 72 | Temp 97.6°F | Resp 16 | Wt 166.0 lb

## 2024-07-18 DIAGNOSIS — J984 Other disorders of lung: Secondary | ICD-10-CM | POA: Diagnosis not present

## 2024-07-18 NOTE — Progress Notes (Signed)
 "       [facility]  Patient Active Problem List   Diagnosis Date Noted   Viral pneumonia 05/22/2024   Rhinovirus infection 05/22/2024   History of hemoptysis 05/21/2024   Essential hypertension 05/21/2024   Hyperlipidemia 05/21/2024   GAD (generalized anxiety disorder) 05/21/2024   Acute bronchitis with bronchiectasis (HCC) 05/21/2024   Hemoptysis 04/07/2024   Acute exacerbation of bronchiectasis (HCC) 04/07/2024   Cavitary lesion of lung 03/20/2024   Hypokalemia 03/20/2013   Anxiety 03/20/2013   Malignant neoplasm of upper-outer quadrant of right breast in female, estrogen receptor positive (HCC) 12/02/2011   Pulmonary thromboembolism (HCC)    Pulmonary embolism (HCC) 11/25/2011   History of breast cancer 11/21/2011   Hepatic steatosis 11/21/2011   Constipation 11/21/2011   Overweight (BMI 25.0-29.9) 11/21/2011   Acute pulmonary embolism (HCC) 11/20/2011   Community acquired pneumonia 11/20/2011   Leukocytosis 11/20/2011   Normocytic anemia 11/20/2011   History of radiation therapy       Subjective:    Patient ID: Brandi Bates, female    DOB: September 19, 1955, 68 y.o.   MRN: [id number]  Chief Complaint  Patient presents with   Hospitalization Follow-up    Bronchiectasis with (acute) exacerbation    HPI:  Brandi Bates is a 68 y.o. female here for f/u hospital admission bronchiectasis exacerbation/hemoptysis   Patient said she is still weaker than baseline and not back to baseline functional status She finished cipro /augmentin  2 weeks prior to this follow up visit 05/09/24  She has no n/v/diarrhea/rash  She is back to her baseline dry intermittent cough  At baseline, she could walk flat ground for a mile, she walks [place], every day at least 6 steps of stairs, indoor chores (laundry). This was the weekend before August 2025   No fever, chill, nightsweat   -------------- 07/18/24 id clinic f/u Patient admitted 05/21/24 for  concern pneumonia however dx'ed with rhino. virus pneumonitis  She is here for hospital follow up  Patient is feeling @ baseline right now. Every now and then dry cough  Good appetite  No fever, chill, nightsweat   Allergies  Allergen Reactions   Ciprofloxacin  Other (See Comments)    Doesn't tolerate medication and gives her uncontrollable tremors of head and hand   Morphine  And Codeine Hives and Itching    All over the body   Omeprazole Magnesium  Nausea Only   Gabapentin Other (See Comments)    Unable to sleep   Prilosec [Omeprazole] Nausea Only   Tamoxifen Citrate Other (See Comments)    Blood clot    Latex Rash    Only where touched      Outpatient Medications Prior to Visit  Medication Sig Dispense Refill   albuterol  (VENTOLIN  HFA) 108 (90 Base) MCG/ACT inhaler Inhale 2 puffs into the lungs 3 (three) times daily for 5 days, THEN 2 puffs every 6 (six) hours as needed for wheezing or shortness of breath. 8 g 0   alendronate (FOSAMAX) 70 MG tablet Take 70 mg by mouth once a week.     Aspirin-Acetaminophen -Caffeine (GOODY HEADACHE PO) Take 1 packet by mouth daily as needed (pain).     atorvastatin  (LIPITOR) 10 MG tablet Take 10 mg by mouth daily.     calcium  carbonate (TUMS - DOSED IN MG ELEMENTAL CALCIUM ) 500 MG chewable tablet Chew 1 tablet by mouth as needed. Reported on 08/06/2015     cholecalciferol  (VITAMIN D3) 25 MCG (1000 UT) tablet Take  1 tablet (1,000 Units total) by mouth daily.     famotidine  (PEPCID ) 20 MG tablet Take 20 mg by mouth daily. (Patient taking differently: Take 20 mg by mouth 2 (two) times daily.)     lisinopril -hydrochlorothiazide  (ZESTORETIC ) 20-12.5 MG tablet Take 1 tablet by mouth daily.     Potassium 99 MG TABS Take daily by mouth.     vitamin C  (ASCORBIC ACID ) 500 MG tablet Take 500 mg by mouth daily.     amoxicillin -clavulanate (AUGMENTIN ) 875-125 MG tablet Take 1 tablet by mouth 2 (two) times daily.     No facility-administered medications  prior to visit.     Social History   Socioeconomic History   Marital status: Married    Spouse name: Not on file   Number of children: Not on file   Years of education: Not on file   Highest education level: Not on file  Occupational History   Not on file  Tobacco Use   Smoking status: Former    Current packs/day: 0.00    Average packs/day: 2.0 packs/day for 28.0 years (56.0 ttl pk-yrs)    Types: Cigarettes    Start date: 05/31/1969    Quit date: 05/31/1997    Years since quitting: 27.1   Smokeless tobacco: Never  Vaping Use   Vaping status: Never Used  Substance and Sexual Activity   Alcohol use: No   Drug use: No   Sexual activity: Never    Comment: P1, 1st pregnancy age 53, no HRT  Other Topics Concern   Not on file  Social History Narrative   Not on file   Social Drivers of Health   Tobacco Use: Medium Risk (07/18/2024)   Patient History    Smoking Tobacco Use: Former    Smokeless Tobacco Use: Never    Passive Exposure: Not on Actuary Strain: Not on file  Food Insecurity: No Food Insecurity (05/22/2024)   Epic    Worried About Programme Researcher, Broadcasting/film/video in the Last Year: Never true    Ran Out of Food in the Last Year: Never true  Transportation Needs: No Transportation Needs (05/22/2024)   Epic    Lack of Transportation (Medical): No    Lack of Transportation (Non-Medical): No  Physical Activity: Not on file  Stress: Not on file  Social Connections: Socially Integrated (05/22/2024)   Social Connection and Isolation Panel    Frequency of Communication with Friends and Family: More than three times a week    Frequency of Social Gatherings with Friends and Family: Once a week    Attends Religious Services: More than 4 times per year    Active Member of [employer] or Organizations: Yes    Attends Banker Meetings: Never    Marital Status: Married  Catering Manager Violence: Not At Risk (05/22/2024)   Epic    Fear of Current or Ex-Partner:  No    Emotionally Abused: No    Physically Abused: No    Sexually Abused: No  Depression (PHQ2-9): Not on file  Alcohol Screen: Not on file  Housing: Low Risk (05/22/2024)   Epic    Unable to Pay for Housing in the Last Year: No    Number of Times Moved in the Last Year: 0    Homeless in the Last Year: No  Utilities: Not At Risk (05/22/2024)   Epic    Threatened with loss of utilities: No  Health Literacy: Not on file  ROS: All other ros negative      Objective:    BP 124/77   Pulse 72   Temp 97.6 F (36.4 C) (Oral)   Resp 16   Wt 166 lb (75.3 kg)   SpO2 96%   BMI 28.49 kg/m  Nursing note and vital signs reviewed.  Physical Exam     General/constitutional: no distress, pleasant HEENT: Normocephalic, PER, Conj Clear, EOMI, Oropharynx clear Neck supple CV: rrr no mrg Lungs: clear to auscultation, normal respiratory effort Abd: Soft, Nontender Ext: no edema Skin: No Rash Neuro: nonfocal MSK: no peripheral joint swelling/tenderness/warmth; back spines nontender   Labs: Lab Results  Component Value Date   WBC 16.1 (H) 05/23/2024   HGB 10.5 (L) 05/23/2024   HCT 34.6 (L) 05/23/2024   MCV 89.4 05/23/2024   PLT 238 05/23/2024   Last metabolic panel Lab Results  Component Value Date   GLUCOSE 250 (H) 05/23/2024   NA 142 05/23/2024   K 3.7 05/23/2024   CL 107 05/23/2024   CO2 21 (L) 05/23/2024   BUN 19 05/23/2024   CREATININE 0.74 05/23/2024   GFRNONAA >60 05/23/2024   CALCIUM  9.1 05/23/2024   PHOS 3.1 11/20/2011   PROT 7.1 05/22/2024   ALBUMIN 3.8 05/22/2024   BILITOT 0.3 05/22/2024   ALKPHOS 82 05/22/2024   AST 31 05/22/2024   ALT 39 05/22/2024   ANIONGAP 14 05/23/2024      Micro:  Serology:  Imaging: Reviewed  04/07/24 cta chest 1. Negative for acute pulmonary embolus. And no obvious contrast extravasation into the lungs. 2. Chronic left upper lobe architectural distortion and cavitary lesion with extensive new surrounding and  widespread bilateral pulmonary airspace disease with areas of early consolidation. No associated pleural effusion. Major airways remain patent. Broad differential considerations including bilateral alveolar hemorrhage, vasculitis, pneumonia, drug reaction or other non-infectious inflammation.   3. CT Abdomen and Pelvis reported separately.  Assessment & Plan:   Problem List Items Addressed This Visit   None      No orders of the defined types were placed in this encounter.    Assessment: 68 yo female with bronchiectasis and left upper lobe cavitary process followed by onc, admitted 9/12 for an episode hemoptysis, now with repeat chest ct showing increased opacity around left upper lobe   9/2 recent bronch cx (both left upper lobe and right lobe) Few pseudomonas sensitive to cefepime /ceftazi, cipro , meropenem, piptazo, tobra Positive AFB culture left upper lobe, still cooking   Discussed with pulm dr Theophilus and it's a little far out to attribute the opacity to bronchoscopy     No sign of sepsis   question indolent process ?mac; no suspicion for tb. Pseudomonas colonization sometimes does contribute to bronchiectasis exacerbation   Would treat with a week as cap/bronchiectasis exacerbation     On a note, it appears she was taking cipro  outpatient starting 9/6 for uti (this should have covered the pseudomonas, so I query if supportive care and making sure no active bleeding lesion that needs to be embolized would be sufficient. But again the increased opacity is not explained by this long out from the bronchosocpy procedure   ------------ 05/09/24 id clinic assessment Patient finished 3 week course abx treatment cipro /augmentin  2 weeks prior to this visit Overall improving but not close to baseline  She likely had experienced a pneumonia/bronchiectasis exacerbation  9/2 bronch showed mac as well... at this time will monitor how she does and will not discuss treatment  unless she  continue to show decline in symptoms, imaging, and persistent culture over the next year   Discuss pathogenis of mac lung, poor efficacy of treatment high relapse, unfavorable tolerability of antibiotics as reason we want to often adopt a watch and wait approach   See me in 3 months. Will plan ct chest repeat in 3-6 months Advise her to follow with pulm for bronchiectasis care as well; consider [facility]   ---------- 07/18/24 id clinic f/u Patient had fully recovered from a couple months ago  She is due for January 2026 repeat chest ct and potential pulm mac/cavitary lesion f/u  No labs needed today  F/u after ct done; I discussed with her we can even discuss on phone regarding ct result as I just saw her now    Follow-up: No follow-ups on file.      Brandi ONEIDA Passer, MD [facility] 07/18/2024, 9:48 AM   "

## 2024-07-18 NOTE — Patient Instructions (Signed)
 Let's talk again after your ct scan   Send me my chart message to review the ct and see if you can cancel appointment

## 2024-08-04 ENCOUNTER — Telehealth: Payer: Self-pay

## 2024-08-04 NOTE — Telephone Encounter (Signed)
 I have placed a document for you to sign from [employer] in regards to the Vest. Please return to [facility] mailbox when signed. Thanks!

## 2024-08-09 ENCOUNTER — Ambulatory Visit (HOSPITAL_COMMUNITY)
Admission: RE | Admit: 2024-08-09 | Discharge: 2024-08-09 | Disposition: A | Discharge status: Home / Self Care | Source: Ambulatory Visit | Attending: Internal Medicine | Admitting: Internal Medicine

## 2024-08-09 DIAGNOSIS — J471 Bronchiectasis with (acute) exacerbation: Secondary | ICD-10-CM | POA: Diagnosis present

## 2024-08-10 ENCOUNTER — Encounter: Payer: Self-pay | Admitting: Internal Medicine

## 2024-09-07 ENCOUNTER — Ambulatory Visit: Admitting: Internal Medicine

## 2024-09-11 ENCOUNTER — Ambulatory Visit: Admitting: Neurology

## 2024-09-28 ENCOUNTER — Telehealth: Admitting: Internal Medicine

## 2024-10-02 ENCOUNTER — Ambulatory Visit

## 2024-10-16 ENCOUNTER — Ambulatory Visit
